# Patient Record
Sex: Female | Born: 1940 | Race: Black or African American | Hispanic: No | Smoking: Never smoker
Health system: Southern US, Community
[De-identification: ages and names within clinical notes are randomized; demographics above are authoritative.]

## PROBLEM LIST (undated history)

## (undated) DIAGNOSIS — F32A Depression, unspecified: Secondary | ICD-10-CM

## (undated) DIAGNOSIS — D649 Anemia, unspecified: Secondary | ICD-10-CM

## (undated) DIAGNOSIS — Z9289 Personal history of other medical treatment: Secondary | ICD-10-CM

## (undated) DIAGNOSIS — K219 Gastro-esophageal reflux disease without esophagitis: Secondary | ICD-10-CM

## (undated) DIAGNOSIS — R2689 Other abnormalities of gait and mobility: Secondary | ICD-10-CM

## (undated) DIAGNOSIS — C801 Malignant (primary) neoplasm, unspecified: Secondary | ICD-10-CM

## (undated) DIAGNOSIS — E538 Deficiency of other specified B group vitamins: Secondary | ICD-10-CM

## (undated) DIAGNOSIS — I10 Essential (primary) hypertension: Secondary | ICD-10-CM

## (undated) DIAGNOSIS — K635 Polyp of colon: Secondary | ICD-10-CM

## (undated) DIAGNOSIS — R140 Abdominal distension (gaseous): Secondary | ICD-10-CM

## (undated) DIAGNOSIS — F329 Major depressive disorder, single episode, unspecified: Secondary | ICD-10-CM

## (undated) DIAGNOSIS — F0390 Unspecified dementia without behavioral disturbance: Secondary | ICD-10-CM

## (undated) DIAGNOSIS — F419 Anxiety disorder, unspecified: Secondary | ICD-10-CM

## (undated) DIAGNOSIS — K921 Melena: Secondary | ICD-10-CM

## (undated) DIAGNOSIS — R32 Unspecified urinary incontinence: Secondary | ICD-10-CM

## (undated) HISTORY — DX: Essential (primary) hypertension: I10

## (undated) HISTORY — DX: Polyp of colon: K63.5

## (undated) HISTORY — PX: OTHER SURGICAL HISTORY: SHX169

## (undated) HISTORY — PX: ABDOMINAL HYSTERECTOMY: SHX81

## (undated) HISTORY — DX: Melena: K92.1

## (undated) HISTORY — DX: Personal history of other medical treatment: Z92.89

## (undated) HISTORY — DX: Unspecified urinary incontinence: R32

---

## 1997-11-01 ENCOUNTER — Other Ambulatory Visit: Admission: RE | Admit: 1997-11-01 | Discharge: 1997-11-01 | Payer: Self-pay | Admitting: *Deleted

## 1998-04-04 ENCOUNTER — Emergency Department (HOSPITAL_COMMUNITY): Admission: EM | Admit: 1998-04-04 | Discharge: 1998-04-04 | Payer: Self-pay | Admitting: Emergency Medicine

## 1999-07-17 ENCOUNTER — Emergency Department (HOSPITAL_COMMUNITY): Admission: EM | Admit: 1999-07-17 | Discharge: 1999-07-17 | Payer: Self-pay | Admitting: Emergency Medicine

## 2004-09-03 ENCOUNTER — Emergency Department (HOSPITAL_COMMUNITY): Admission: EM | Admit: 2004-09-03 | Discharge: 2004-09-03 | Payer: Self-pay | Admitting: Emergency Medicine

## 2004-09-18 ENCOUNTER — Emergency Department (HOSPITAL_COMMUNITY): Admission: EM | Admit: 2004-09-18 | Discharge: 2004-09-18 | Payer: Self-pay | Admitting: Emergency Medicine

## 2013-02-15 ENCOUNTER — Encounter (HOSPITAL_COMMUNITY): Payer: Self-pay | Admitting: Emergency Medicine

## 2013-02-15 ENCOUNTER — Emergency Department (HOSPITAL_COMMUNITY)
Admission: EM | Admit: 2013-02-15 | Discharge: 2013-02-15 | Disposition: A | Discharge status: Home / Self Care | Payer: Medicare Other | Attending: Emergency Medicine | Admitting: Emergency Medicine

## 2013-02-15 DIAGNOSIS — H5789 Other specified disorders of eye and adnexa: Secondary | ICD-10-CM

## 2013-02-15 DIAGNOSIS — M79604 Pain in right leg: Secondary | ICD-10-CM

## 2013-02-15 DIAGNOSIS — M79609 Pain in unspecified limb: Secondary | ICD-10-CM | POA: Insufficient documentation

## 2013-02-15 DIAGNOSIS — H579 Unspecified disorder of eye and adnexa: Secondary | ICD-10-CM | POA: Insufficient documentation

## 2013-02-15 MED ORDER — DIPHENHYDRAMINE HCL 25 MG PO TABS: 25.0000 mg | ORAL_TABLET | Freq: Four times a day (QID) | ORAL | Status: DC

## 2013-02-15 MED ORDER — DIPHENHYDRAMINE HCL 25 MG PO CAPS
25.0000 mg | ORAL_CAPSULE | Freq: Once | ORAL | Status: AC
  Administered 2013-02-15: 25 mg via ORAL
  Filled 2013-02-15: qty 1

## 2013-02-15 MED ORDER — IBUPROFEN 400 MG PO TABS
400.0000 mg | ORAL_TABLET | Freq: Four times a day (QID) | ORAL | Status: DC | PRN
Start: 2013-02-15 — End: 2013-02-15

## 2013-02-15 MED ORDER — ACETAMINOPHEN 325 MG PO TABS
650.0000 mg | ORAL_TABLET | Freq: Once | ORAL | Status: AC
  Administered 2013-02-15: 650 mg via ORAL
  Filled 2013-02-15: qty 2

## 2013-02-15 MED ORDER — IBUPROFEN 400 MG PO TABS: 400.0000 mg | ORAL_TABLET | Freq: Four times a day (QID) | ORAL | Status: DC | PRN

## 2013-02-15 NOTE — ED Provider Notes (Signed)
CSN: [id number]     Arrival date & time 02/15/13  1452 History  This chart was scribed for non-physician practitioner, Noland Fordyce, PA-C working with Alfonzo Feller, DO by Frederich Balding, ED scribe. This patient was seen in room TR06C/TR06C and the patient's care was started at 3:44 PM.   Chief Complaint  Patient presents with  . Rash  . Leg Pain   The history is provided by the patient. No language interpreter was used.   HPI Comments: [name] is a 73 y.o. female who presents to the Emergency Department complaining of gradual onset, constant, bilateral eye itching that started about two weeks ago. She states she used tea tree under eyes earlier to relieve some itching but states it made it worse and she developed a rash. Denies visual disturbance. Pt is also complaining of intermittent, sharp right shin pain that started one week ago. Denies recent injury or new activities. Rates the pain 8/10. She has taken Tylenol with no relief of pain. Elevating the leg relieves some pain. Denies history of blood clots. Denies calf pain, redness or bruising.   History reviewed. No pertinent past medical history. History reviewed. No pertinent past surgical history. No family history on file. History  Substance Use Topics  . Smoking status: Never Smoker   . Smokeless tobacco: Not on file  . Alcohol Use: No   OB History   Grav Para Term Preterm Abortions TAB SAB Ect Mult Living                 Review of Systems  Eyes: Positive for itching. Negative for visual disturbance.  Musculoskeletal: Positive for myalgias.  Skin: Positive for rash. Negative for color change.  All other systems reviewed and are negative.   Allergies  Review of patient's allergies indicates no known allergies.  Home Medications   Current Outpatient Rx  Name  Route  Sig  Dispense  Refill  . acetaminophen (TYLENOL) 325 MG tablet   Oral   Take 650 mg by mouth every 6 (six) hours as needed for moderate  pain.         . diphenhydrAMINE (BENADRYL) 25 MG tablet   Oral   Take 1 tablet (25 mg total) by mouth every 6 (six) hours.   20 tablet   0   . ibuprofen (ADVIL,MOTRIN) 400 MG tablet   Oral   Take 1 tablet (400 mg total) by mouth every 6 (six) hours as needed.   15 tablet   0     BP 169/71  Pulse 96  Temp(Src) 97.2 F (36.2 C) (Oral)  Resp 18  Ht 5\' 4"  (1.626 m)  Wt 187 lb (84.823 kg)  BMI 32.08 kg/m2  SpO2 100%  Physical Exam  Nursing note and vitals reviewed. Constitutional: She appears well-developed and well-nourished. No distress.  HENT:  Head: Normocephalic and atraumatic.  Eyes: Conjunctivae are normal. No scleral icterus.  Clear discharge in right eye. Mild erythema under both eyes. No edema. No evidence of periorbital cellulitis.   Neck: Normal range of motion.  Cardiovascular: Normal rate, regular rhythm and normal heart sounds.   Pulmonary/Chest: Effort normal and breath sounds normal. No respiratory distress. She has no wheezes. She has no rales. She exhibits no tenderness.  Musculoskeletal: Normal range of motion.  Right anterior leg tenderness over muscle. No bony tenderness. No ecchymosis, erythema or lesions. Muscle compartment is soft. Not concerned for compartments syndrome.   Neurological: She is alert.  Skin: Skin is  warm and dry. She is not diaphoretic.    ED Course  Procedures (including critical care time)  DIAGNOSTIC STUDIES: Oxygen Saturation is 100% on RA, normal by my interpretation.    COORDINATION OF CARE: 3:51 PM-Discussed treatment plan which includes warm and cool compresses and Benadryl for her eyes and ice and an anti-inflammatory for her leg with pt at bedside and pt agreed to plan. Advised pt to follow up with an ophthalmologist if eye symptoms do not resolve.   Labs Review Labs Reviewed - No data to display Imaging Review No results found.  EKG Interpretation   None       MDM   1. Irritation of both eyes   2. Right  leg pain    Not concerned for DVT in right leg, pain is anterior. Tender along muscle but soft compartment. No hx of trauma. No hx of blood clots.    Eyes: mild erythema under both eyes. No edema or circular erythema or tenderness. Clear discharge right eye. Not concerned for periorbital cellulitis. Advised to discontinue tea tree oil.  F/u with PCP. Return precautions provided. Pt verbalized understanding and agreement with tx plan.  Discussed pt with Dr. Thurnell Garbe who agreed with assessment and plan.  I personally performed the services described in this documentation, which was scribed in my presence. The recorded information has been reviewed and is accurate.   Noland Fordyce, PA-C 02/16/13 0001

## 2013-02-15 NOTE — ED Notes (Signed)
Pt presents to department for evaluation of rash around both eyes and R leg pain. States she recently started using tea tree oil on face and now rash around both eyes. Also states R leg pain x1 week. Denies recent injury. Pt is alert and oriented x4.

## 2013-02-16 NOTE — ED Provider Notes (Signed)
Medical screening examination/treatment/procedure(s) were performed by non-physician practitioner and as supervising physician I was immediately available for consultation/collaboration.  EKG Interpretation   None         Alfonzo Feller, DO 02/16/13 1137

## 2013-07-26 ENCOUNTER — Observation Stay: Payer: Self-pay | Admitting: Internal Medicine

## 2013-07-26 LAB — HEPATIC FUNCTION PANEL A (ARMC)
ALT: 16 U/L (ref 12–78)
Albumin: 3.7 g/dL (ref 3.4–5.0)
Alkaline Phosphatase: 77 U/L
BILIRUBIN TOTAL: 0.3 mg/dL (ref 0.2–1.0)
Bilirubin, Direct: <0.1 mg/dL (ref 0.00–0.20)
SGOT(AST): 16 U/L (ref 15–37)
Total Protein: 8.1 g/dL (ref 6.4–8.2)

## 2013-07-26 LAB — URINALYSIS, COMPLETE
Bacteria: NONE SEEN
Bilirubin,UR: NEGATIVE
Glucose,UR: NEGATIVE mg/dL (ref 0–75)
Ketone: NEGATIVE
Nitrite: NEGATIVE
Ph: 5 (ref 4.5–8.0)
Protein: NEGATIVE
SPECIFIC GRAVITY: 1.014 (ref 1.003–1.030)
Squamous Epithelial: 5

## 2013-07-26 LAB — CBC WITH DIFFERENTIAL/PLATELET
BASOS ABS: 1 %
Eosinophil: 3 %
HCT: 28.5 % — ABNORMAL LOW (ref 35.0–47.0)
HGB: 6.9 g/dL — AB (ref 12.0–16.0)
Lymphocytes: 33 %
MCHC: 26.4 g/dL — ABNORMAL LOW (ref 32.0–36.0)
MCV: 50 fL — AB (ref 80–100)
Monocytes: 6 %
Platelet: 588 10*3/uL — ABNORMAL HIGH (ref 150–440)
RBC: 5.25 10*6/uL — ABNORMAL HIGH (ref 3.80–5.20)
RDW: 23.6 % — ABNORMAL HIGH (ref 11.5–14.5)
Segmented Neutrophils: 56 %
Variant Lymphocyte - H1-Rlymph: 1 %
WBC: 7.2 10*3/uL (ref 3.6–11.0)

## 2013-07-26 LAB — BASIC METABOLIC PANEL
Anion Gap: 8 (ref 7–16)
BUN: 10 mg/dL (ref 7–18)
Calcium, Total: 9.2 mg/dL (ref 8.5–10.1)
Chloride: 102 mmol/L (ref 98–107)
Co2: 25 mmol/L (ref 21–32)
Creatinine: 0.7 mg/dL (ref 0.60–1.30)
EGFR (African American): >60
EGFR (Non-African Amer.): >60
Glucose: 146 mg/dL — ABNORMAL HIGH (ref 65–99)
Osmolality: 272 (ref 275–301)
Potassium: 3.9 mmol/L (ref 3.5–5.1)
SODIUM: 135 mmol/L — AB (ref 136–145)

## 2013-07-26 LAB — RETICULOCYTES
Absolute Retic Count: 0.0832 10*6/uL (ref 0.019–0.186)
RETICULOCYTE: 1.61 % (ref 0.4–3.1)

## 2013-07-26 LAB — IRON AND TIBC
IRON BIND. CAP.(TOTAL): 463 ug/dL — AB (ref 250–450)
Iron Saturation: 3 %
Iron: 15 ug/dL — ABNORMAL LOW (ref 50–170)
UNBOUND IRON-BIND. CAP.: 448 ug/dL

## 2013-07-26 LAB — PROTIME-INR
INR: 1.1
PROTHROMBIN TIME: 13.6 s (ref 11.5–14.7)

## 2013-07-27 LAB — IRON AND TIBC
Iron Bind.Cap.(Total): 459 ug/dL — ABNORMAL HIGH (ref 250–450)
Iron Saturation: 3 %
Iron: 12 ug/dL — ABNORMAL LOW (ref 50–170)
UNBOUND IRON-BIND. CAP.: 447 ug/dL

## 2013-07-27 LAB — CBC WITH DIFFERENTIAL/PLATELET
Eosinophil: 4 %
HCT: 26.7 % — AB (ref 35.0–47.0)
HGB: 6.6 g/dL — AB (ref 12.0–16.0)
Lymphocytes: 33 %
MCHC: 27.1 g/dL — ABNORMAL LOW (ref 32.0–36.0)
MCV: 50 fL — ABNORMAL LOW (ref 80–100)
Monocytes: 8 %
PLATELETS: 541 10*3/uL — AB (ref 150–440)
RBC: 4.89 10*6/uL (ref 3.80–5.20)
RDW: 22.8 % — AB (ref 11.5–14.5)
SEGMENTED NEUTROPHILS: 55 %
WBC: 6.8 10*3/uL (ref 3.6–11.0)

## 2013-07-27 LAB — BASIC METABOLIC PANEL
ANION GAP: 6 — AB (ref 7–16)
BUN: 8 mg/dL (ref 7–18)
CALCIUM: 9 mg/dL (ref 8.5–10.1)
CHLORIDE: 103 mmol/L (ref 98–107)
Co2: 25 mmol/L (ref 21–32)
Creatinine: 0.85 mg/dL (ref 0.60–1.30)
EGFR (Non-African Amer.): >60
Glucose: 117 mg/dL — ABNORMAL HIGH (ref 65–99)
OSMOLALITY: 268 (ref 275–301)
POTASSIUM: 4 mmol/L (ref 3.5–5.1)
Sodium: 134 mmol/L — ABNORMAL LOW (ref 136–145)

## 2013-07-27 LAB — FOLATE: Folic Acid: 19.4 ng/mL (ref 3.1–100.0)

## 2013-07-28 LAB — CBC WITH DIFFERENTIAL/PLATELET
Eosinophil: 1 %
HCT: 24.4 % — AB (ref 35.0–47.0)
HGB: 6.6 g/dL — ABNORMAL LOW (ref 12.0–16.0)
LYMPHS PCT: 33 %
MCH: 13.4 pg — ABNORMAL LOW (ref 26.0–34.0)
MCHC: 27 g/dL — ABNORMAL LOW (ref 32.0–36.0)
MCV: 50 fL — ABNORMAL LOW (ref 80–100)
Monocytes: 8 %
PLATELETS: 494 10*3/uL — AB (ref 150–440)
RBC: 4.91 10*6/uL (ref 3.80–5.20)
RDW: 23 % — ABNORMAL HIGH (ref 11.5–14.5)
Segmented Neutrophils: 58 %
WBC: 6 10*3/uL (ref 3.6–11.0)

## 2013-07-28 LAB — URINE CULTURE

## 2013-08-04 ENCOUNTER — Ambulatory Visit: Payer: Self-pay | Admitting: Oncology

## 2013-08-04 LAB — CANCER CENTER HEMOGLOBIN: HGB: 8.1 g/dL — ABNORMAL LOW (ref 12.0–16.0)

## 2013-08-05 LAB — CEA: CEA: 1.6 ng/mL (ref 0.0–4.7)

## 2013-08-05 LAB — CA 125: CA 125: 10 U/mL (ref 0.0–34.0)

## 2013-08-29 ENCOUNTER — Ambulatory Visit: Payer: Self-pay | Admitting: Oncology

## 2014-05-22 NOTE — Consult Note (Signed)
PATIENT NAME:  [name], [name] MR#:  [id number] DATE OF BIRTH:  [birth year]  DATE OF CONSULTATION:  07/27/2013  REQUESTING PHYSICIAN:  Dr. Benjie Karvonen.  CONSULTING PHYSICIAN:  Otelia Limes. Yves Dill, MD.  REASON FOR CONSULTATION: Microscopic hematuria.   HISTORY OF PRESENT ILLNESS: [name] is a 74 year old African American female admitted to the hospital with vertigo and dizziness. She was found to have a hemoglobin of 6.8 in the Emergency Room and was admitted. She was evaluated with urinalysis, which indicated 426 red cells per high-power field. She also had a CT scan which indicated the presence of a 3-mm right lower pole renal calculus. She specifically denied gross hematuria, flank pain, dysuria, history of renal colic, or history of urologic instrumentation or surgery.   ALLERGIES:  No drug allergies.   PAST MEDICAL HISTORY: No medications, no previous surgery.   SOCIAL HISTORY:  She denied tobacco or alcohol use.   FAMILY HISTORY: Negative for urologic and renal disease.   REVIEW OF SYSTEMS: The patient denied heart disease, lung disease, stroke, diabetes, or hypertension.   PHYSICAL EXAMINATION:  GENERAL: Well-nourished, African American female in distress.  HEENT: Sclerae are clear. Pupils are equally round, reactive to light. Extraocular movements are intact.  NECK: Supple. No palpable cervical adenopathy.  LUNGS: Clear to auscultation.  CARDIOVASCULAR: Regular rhythm and rate without audible murmurs.  ABDOMEN: Soft, nontender abdomen. No CVA tenderness.  GU/RECTAL: Deferred.   LABORATORY DATA:  BUN was 8, creatinine 0.85 on June 29.  White cell count was 6800, hematocrit 26.7% on June 29.   CT scan report was reviewed.   IMPRESSION:  1.  Microscopic hematuria.  2.  Right nephrolithiasis.   SUGGESTIONS:  1.  I explained hematuria evaluation and etiologies with the patient.  2.  Suggest she follow up in the office for cystoscopy and to repeat urinalysis.  3.  There are no  emergent indications for cystoscopy or lithotripsy at this time.    ____________________________ Otelia Limes. Yves Dill, MD mrw:lt D: 07/27/2013 12:58:03 ET T: 07/27/2013 13:48:46 ET JOB#: [id number]  cc: Otelia Limes. Yves Dill, MD, <Dictator> Royston Cowper MD ELECTRONICALLY SIGNED 07/28/2013 8:35

## 2014-05-22 NOTE — Discharge Summary (Signed)
PATIENT NAME:  [name], [name] MR#:  [id number] DATE OF BIRTH:  [birth year]  DATE OF ADMISSION:  07/26/2013 DATE OF DISCHARGE:  07/28/2013  ADMISSION DIAGNOSIS: Anemia.   DISCHARGE DIAGNOSES: 1.  Anemia, iron deficiency.  2.  Calf pain.  3.  Hematuria.  4.  Pelvic mass.   CONSULTATIONS: Urology, Dr. Yves Dill.   DIAGNOSTIC DATA: Laboratories at discharge: White blood cells 6, hemoglobin 6.6, hematocrit 25, platelets 494,000.   Lower extremity Doppler negative for DVT.   Carotid Dopplers were negative for hemodynamically significant stenosis.   Iron is 12. Iron saturation is 3.  Vitamin B12 was normal.   Folic acid was 29.5.   CT of the abdomen and pelvis with contrast showed no acute intra-abdominal findings. There is an 8 cm left pelvic mass which could be adnexal or uterine. Also, abnormal endometrial thickening around the endometrial implant. Recommend nonemergent GYN consult.   HOSPITAL COURSE: A 74 year old female who presented with feeling wobbly and found to have significant anemia.  1.  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 The patient has a low MCV. 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 I discussed in great length with the patient and the patient's family members the risks including MI, CVA and end organ damage of continuing to have a very low hemoglobin. The patient did not want a blood transfusion. She wanted blood transfusion from her family members so I called the blood bank and they obviously cannot get this at any time soon so they recommended for the patient's family to call the blood bank to make an appointment for direct donor. I told the patient this and her family member and provided the phone number on her discharge paperwork. The patient will be discharged with iron supplements. Her vitals were stable at discharge. Again, the patient was given side effects, alternatives, benefits and risks and she  chose not to get any blood transfusions. The patient will also have outpatient followup with hematology for further evaluation of this chronic blood loss anemia and will likely need a GI consultation as well. 2.  Hematuria. The patient was seen by urology here and she will outpatient follow-up for possible cystoscopy.  3.  Pelvic mass seen on CT scan. The patient will have outpatient follow-up with GYN. 4.  Dizziness. Probably secondary to problem #1. 5.  Left calf pain. Negative DVT,  which improved.   DISCHARGE MEDICATIONS: Ferrous sulfate 325 p.o. b.i.d.   DISCHARGE DIET: Regular diet.   DISCHARGE ACTIVITY: As tolerated.   DISCHARGE FOLLOWUP: The patient will follow up with oncology, urology and GYN within 1 week.   The patient is medically stable for discharge. Plan of care was discussed with the patient and family members.   TIME SPENT: 40 minutes.   ____________________________ Donell Beers. Benjie Karvonen, MD spm:sb D: 07/28/2013 11:58:26 ET T: 07/28/2013 12:16:26 ET JOB#: [id number]  cc: Sital P. Benjie Karvonen, MD, <Dictator> Donell Beers MODY MD ELECTRONICALLY SIGNED 07/29/2013 13:37

## 2014-05-22 NOTE — Consult Note (Signed)
Brief Consult Note: Diagnosis: Microscopic hematuria. Right nephrolithiasis.   Patient was seen by consultant.   Consult note dictated.   Recommend further assessment or treatment.   Orders entered.   Discussed with Attending MD.   Comments: Follow-up in the office. No urgent indication for cystoscopy or ESWL at this time. Discussed hematuria causes and evaluation with patient.  Electronic Signatures: Royston Cowper (MD)  (Signed 29-Jun-15 12:53)  Authored: Brief Consult Note   Last Updated: 29-Jun-15 12:53 by Royston Cowper (MD)

## 2014-05-22 NOTE — H&P (Signed)
PATIENT NAME:  [name], [name] MR#:  [id number] DATE OF BIRTH:  [birth year]  DATE OF ADMISSION:  07/26/2013  PRIMARY CARE PHYSICIAN: None.   REFERRING PHYSICIAN: Dr. Graciella Freer.   CHIEF COMPLAINT: Feeling wobbly.   HISTORY OF PRESENT ILLNESS: [name] is a 74 year old female with no past medical history. Comes to the Emergency Department feeling dizzy since yesterday. The symptoms were worse today and fell down into the couch. Concerning this, the patient is brought to the Emergency Department. Workup in the Emergency Department: The patient is found to have hemoglobin of 6.8 with an MCV of 50. The patient denies having any bloody stools. The patient never had any colonoscopy. The patient states sees some blood after she wipes after urination, most likely from the vagina. Denies having any blood in the urine. A urinalysis shows RBCs of 426. The patient declines any transfusion. The patient states had Pap smear many years ago.   PAST MEDICAL HISTORY: None.   PAST SURGICAL HISTORY: None.   ALLERGIES: No known drug allergies.   HOME MEDICATIONS: None.   SOCIAL HISTORY: No history of smoking, drinking alcohol or using illicit drugs. Lives by herself.   FAMILY HISTORY: Both parents dies of old age.    REVIEW OF SYSTEMS:  CONSTITUTIONAL: Denies any generalized weakness.  EYES: No change in vision.  EARS, NOSE, THROAT: No change in hearing.  RESPIRATORY: No cough, shortness of breath.  CARDIOVASCULAR: No chest pain, palpations.  GASTROINTESTINAL: No nausea, vomiting, abdominal pain.  GENITOURINARY: No dysuria or hematuria; however, has some vaginal bleed.  ENDOCRINE: No polyuria or polydipsia.  SKIN: No rash or lesions.   MUSCULOSKELETAL: No joint pains and aches.  NEUROLOGIC: No weakness or numbness in any part of the body.   PHYSICAL EXAMINATION:  GENERAL: This is a well-built, well-nourished, age-appropriate female lying down in the bed, not in distress.  VITAL SIGNS:  Temperature 98.5, pulse 92, blood pressure 155/81, respiratory rate of 16, oxygen saturation 100% on room air.  HEENT: Head normocephalic, atraumatic. There is no scleral icterus. Conjunctivae normal. Pupils equal and react to light. Extraocular movements are intact. Mucous membranes moist. No pharyngeal erythema.  NECK: Supple. No lymphadenopathy. No JVD. No carotid bruit. No thyromegaly.  CHEST: Has no focal tenderness.  LUNGS: Bilaterally clear to auscultation.  HEART: S1, S2 regular. No murmurs are heard.  ABDOMEN: Obese. Bowel sounds present. Soft, nontender, nondistended. No hepatosplenomegaly.  EXTREMITIES: No pedal edema. Pulses 2+.  SKIN: No rash or lesions.  MUSCULOSKELETAL: No joint pains and aches.  NEUROLOGIC: The patient is alert, oriented to place, person, and time. Cranial nerves II through XII intact. Motor 5/5 in upper and lower extremities.   LABORATORIES: BMP is completely within normal limits. CBC: WBC of 7.2, hemoglobin 6.9, platelet count of 588, MCV of 50.   ASSESSMENT AND PLAN: [name] a 74 year old female who comes to the Emergency Department with wobbliness, dizziness.  1. Anemia: The patient has a low MCV. Concerned about secondary to blood loss of a chronic nature. The source of the bleeding is uncertain. Stool occult is done, weakly positive. Urine shows RBCs . The patient also states has some vaginal bleed. Concerning this, possible multiple sources. Will obtain CT abdomen and pelvis with contrast. The patient has not seen any physician for many years. Will obtain workup with iron profile, B12, folate, RBC and retic count. The patient declines a blood transfusion.  2. Wobbliness: Most likely secondary to low hemoglobin; however, does not show any neurologic  deficits. The patient currently denies having any symptoms. Concerning the patient's age, will obtain MRI of the brain, carotid Dopplers and echocardiogram, doing possible workup for transient ischemic attack.   3. Hematuria: As mentioned above. Will obtain CT abdomen and pelvis. Consider consulting urology.  4. Keep the patient on deep vein thrombosis prophylaxis with sequential compression devices.   TIME SPENT: 50 minutes.    ____________________________ Monica Becton, MD pv:gb D: 07/26/2013 22:36:20 ET T: 07/27/2013 01:18:06 ET JOB#: [id number]  cc: Monica Becton, MD, <Dictator> Monica Becton MD ELECTRONICALLY SIGNED 08/05/2013 21:53

## 2015-01-30 ENCOUNTER — Inpatient Hospital Stay
Admission: EM | Admit: 2015-01-30 | Discharge: 2015-02-04 | DRG: 755 | Disposition: A | Discharge status: Skilled Nursing Facility | Payer: Medicare Other | Attending: Internal Medicine | Admitting: Internal Medicine

## 2015-01-30 ENCOUNTER — Inpatient Hospital Stay: Payer: Medicare Other

## 2015-01-30 ENCOUNTER — Encounter: Payer: Self-pay | Admitting: Emergency Medicine

## 2015-01-30 DIAGNOSIS — R1909 Other intra-abdominal and pelvic swelling, mass and lump: Secondary | ICD-10-CM | POA: Diagnosis not present

## 2015-01-30 DIAGNOSIS — C569 Malignant neoplasm of unspecified ovary: Secondary | ICD-10-CM | POA: Diagnosis not present

## 2015-01-30 DIAGNOSIS — R2681 Unsteadiness on feet: Secondary | ICD-10-CM | POA: Diagnosis not present

## 2015-01-30 DIAGNOSIS — D473 Essential (hemorrhagic) thrombocythemia: Secondary | ICD-10-CM | POA: Diagnosis present

## 2015-01-30 DIAGNOSIS — K219 Gastro-esophageal reflux disease without esophagitis: Secondary | ICD-10-CM | POA: Diagnosis present

## 2015-01-30 DIAGNOSIS — N950 Postmenopausal bleeding: Secondary | ICD-10-CM | POA: Diagnosis present

## 2015-01-30 DIAGNOSIS — R188 Other ascites: Secondary | ICD-10-CM | POA: Diagnosis present

## 2015-01-30 DIAGNOSIS — R140 Abdominal distension (gaseous): Secondary | ICD-10-CM | POA: Diagnosis not present

## 2015-01-30 DIAGNOSIS — R609 Edema, unspecified: Secondary | ICD-10-CM

## 2015-01-30 DIAGNOSIS — E860 Dehydration: Secondary | ICD-10-CM | POA: Diagnosis present

## 2015-01-30 DIAGNOSIS — Z79899 Other long term (current) drug therapy: Secondary | ICD-10-CM | POA: Diagnosis not present

## 2015-01-30 DIAGNOSIS — D649 Anemia, unspecified: Secondary | ICD-10-CM | POA: Diagnosis present

## 2015-01-30 DIAGNOSIS — R1111 Vomiting without nausea: Secondary | ICD-10-CM | POA: Diagnosis not present

## 2015-01-30 DIAGNOSIS — M7989 Other specified soft tissue disorders: Secondary | ICD-10-CM | POA: Diagnosis not present

## 2015-01-30 DIAGNOSIS — F5089 Other specified eating disorder: Secondary | ICD-10-CM | POA: Diagnosis present

## 2015-01-30 DIAGNOSIS — D500 Iron deficiency anemia secondary to blood loss (chronic): Secondary | ICD-10-CM | POA: Diagnosis present

## 2015-01-30 DIAGNOSIS — Z124 Encounter for screening for malignant neoplasm of cervix: Secondary | ICD-10-CM | POA: Diagnosis not present

## 2015-01-30 DIAGNOSIS — E871 Hypo-osmolality and hyponatremia: Secondary | ICD-10-CM | POA: Diagnosis present

## 2015-01-30 DIAGNOSIS — R112 Nausea with vomiting, unspecified: Secondary | ICD-10-CM | POA: Diagnosis not present

## 2015-01-30 DIAGNOSIS — R7301 Impaired fasting glucose: Secondary | ICD-10-CM | POA: Diagnosis present

## 2015-01-30 DIAGNOSIS — N939 Abnormal uterine and vaginal bleeding, unspecified: Secondary | ICD-10-CM | POA: Diagnosis not present

## 2015-01-30 DIAGNOSIS — Z6841 Body Mass Index (BMI) 40.0 and over, adult: Secondary | ICD-10-CM

## 2015-01-30 DIAGNOSIS — R1110 Vomiting, unspecified: Secondary | ICD-10-CM

## 2015-01-30 DIAGNOSIS — Z9119 Patient's noncompliance with other medical treatment and regimen: Secondary | ICD-10-CM | POA: Diagnosis not present

## 2015-01-30 DIAGNOSIS — R6889 Other general symptoms and signs: Secondary | ICD-10-CM | POA: Diagnosis not present

## 2015-01-30 DIAGNOSIS — Z5189 Encounter for other specified aftercare: Secondary | ICD-10-CM | POA: Diagnosis not present

## 2015-01-30 DIAGNOSIS — R9431 Abnormal electrocardiogram [ECG] [EKG]: Secondary | ICD-10-CM | POA: Diagnosis not present

## 2015-01-30 DIAGNOSIS — M6281 Muscle weakness (generalized): Secondary | ICD-10-CM | POA: Diagnosis not present

## 2015-01-30 DIAGNOSIS — R1900 Intra-abdominal and pelvic swelling, mass and lump, unspecified site: Secondary | ICD-10-CM | POA: Diagnosis not present

## 2015-01-30 DIAGNOSIS — E876 Hypokalemia: Secondary | ICD-10-CM | POA: Diagnosis present

## 2015-01-30 DIAGNOSIS — N949 Unspecified condition associated with female genital organs and menstrual cycle: Secondary | ICD-10-CM | POA: Diagnosis not present

## 2015-01-30 DIAGNOSIS — R319 Hematuria, unspecified: Secondary | ICD-10-CM | POA: Diagnosis present

## 2015-01-30 DIAGNOSIS — R41841 Cognitive communication deficit: Secondary | ICD-10-CM | POA: Diagnosis not present

## 2015-01-30 HISTORY — DX: Anemia, unspecified: D64.9

## 2015-01-30 LAB — CBC WITH DIFFERENTIAL/PLATELET
Basophils Absolute: 0.1 10*3/uL (ref 0–0.1)
EOS ABS: 0.1 10*3/uL (ref 0–0.7)
Eosinophils Relative: 1 %
HCT: 20.7 % — ABNORMAL LOW (ref 35.0–47.0)
HEMOGLOBIN: 5.3 g/dL — AB (ref 12.0–16.0)
LYMPHS ABS: 1.3 10*3/uL (ref 1.0–3.6)
Lymphocytes Relative: 13 %
MCH: 12.1 pg — ABNORMAL LOW (ref 26.0–34.0)
MCHC: 25.8 g/dL — ABNORMAL LOW (ref 32.0–36.0)
MCV: 46.9 fL — ABNORMAL LOW (ref 80.0–100.0)
Monocytes Absolute: 0.7 10*3/uL (ref 0.2–0.9)
Monocytes Relative: 8 %
Neutro Abs: 7.4 10*3/uL — ABNORMAL HIGH (ref 1.4–6.5)
Platelets: 454 10*3/uL — ABNORMAL HIGH (ref 150–440)
RBC: 4.41 MIL/uL (ref 3.80–5.20)
RDW: 22.3 % — ABNORMAL HIGH (ref 11.5–14.5)
WBC: 9.6 10*3/uL (ref 3.6–11.0)

## 2015-01-30 LAB — URINALYSIS COMPLETE WITH MICROSCOPIC (ARMC ONLY)
Bacteria, UA: NONE SEEN
Bilirubin Urine: NEGATIVE
Glucose, UA: NEGATIVE mg/dL
Nitrite: NEGATIVE
PROTEIN: 30 mg/dL — AB
SPECIFIC GRAVITY, URINE: 1.014 (ref 1.005–1.030)
pH: 6 (ref 5.0–8.0)

## 2015-01-30 LAB — COMPREHENSIVE METABOLIC PANEL
ALBUMIN: 3 g/dL — AB (ref 3.5–5.0)
ALK PHOS: 44 U/L (ref 38–126)
ALT: 13 U/L — AB (ref 14–54)
AST: 16 U/L (ref 15–41)
Anion gap: 6 (ref 5–15)
BUN: 9 mg/dL (ref 6–20)
CALCIUM: 7.9 mg/dL — AB (ref 8.9–10.3)
CO2: 21 mmol/L — AB (ref 22–32)
CREATININE: 0.61 mg/dL (ref 0.44–1.00)
Chloride: 100 mmol/L — ABNORMAL LOW (ref 101–111)
GFR calc Af Amer: >60 mL/min (ref >60)
GFR calc non Af Amer: >60 mL/min (ref >60)
GLUCOSE: 153 mg/dL — AB (ref 65–99)
Potassium: 3.2 mmol/L — ABNORMAL LOW (ref 3.5–5.1)
SODIUM: 127 mmol/L — AB (ref 135–145)
Total Bilirubin: 0.9 mg/dL (ref 0.3–1.2)
Total Protein: 6.7 g/dL (ref 6.5–8.1)

## 2015-01-30 LAB — FERRITIN: Ferritin: 5 ng/mL — ABNORMAL LOW (ref 11–307)

## 2015-01-30 LAB — ABO/RH: ABO/RH(D): O POS

## 2015-01-30 LAB — PREPARE RBC (CROSSMATCH)

## 2015-01-30 LAB — LIPASE, BLOOD: Lipase: 23 U/L (ref 11–51)

## 2015-01-30 MED ORDER — SODIUM CHLORIDE 0.9 % IV SOLN: 10.0000 mL/h | Freq: Once | INTRAVENOUS | Status: DC

## 2015-01-30 MED ORDER — SODIUM CHLORIDE 0.9 % IJ SOLN
3.0000 mL | Freq: Two times a day (BID) | INTRAMUSCULAR | Status: DC
  Administered 2015-01-30 – 2015-02-04 (×8): 3 mL via INTRAVENOUS

## 2015-01-30 MED ORDER — MORPHINE SULFATE (PF) 2 MG/ML IV SOLN
2.0000 mg | INTRAVENOUS | Status: DC | PRN
  Administered 2015-02-02 – 2015-02-04 (×4): 2 mg via INTRAVENOUS
  Filled 2015-01-30 (×4): qty 1

## 2015-01-30 MED ORDER — ONDANSETRON HCL 4 MG PO TABS: 4.0000 mg | ORAL_TABLET | Freq: Four times a day (QID) | ORAL | Status: DC | PRN

## 2015-01-30 MED ORDER — PROMETHAZINE HCL 25 MG/ML IJ SOLN
12.5000 mg | Freq: Once | INTRAMUSCULAR | Status: AC
  Administered 2015-01-30: 12.5 mg via INTRAVENOUS

## 2015-01-30 MED ORDER — HALOPERIDOL LACTATE 5 MG/ML IJ SOLN
5.0000 mg | Freq: Once | INTRAMUSCULAR | Status: AC
  Administered 2015-01-30: 22:00:00 5 mg via INTRAVENOUS
  Filled 2015-01-30: qty 1

## 2015-01-30 MED ORDER — ACETAMINOPHEN 325 MG PO TABS: 650.0000 mg | ORAL_TABLET | Freq: Four times a day (QID) | ORAL | Status: DC | PRN

## 2015-01-30 MED ORDER — ONDANSETRON HCL 4 MG/2ML IJ SOLN
4.0000 mg | Freq: Once | INTRAMUSCULAR | Status: AC
  Administered 2015-01-30: 4 mg via INTRAVENOUS
  Filled 2015-01-30: qty 2

## 2015-01-30 MED ORDER — POTASSIUM CHLORIDE IN NACL 20-0.9 MEQ/L-% IV SOLN
INTRAVENOUS | Status: DC
Start: 2015-01-30 — End: 2015-01-31
  Administered 2015-01-31: 02:00:00 via INTRAVENOUS
  Filled 2015-01-30 (×2): qty 1000

## 2015-01-30 MED ORDER — LORAZEPAM 2 MG/ML IJ SOLN
0.5000 mg | Freq: Once | INTRAMUSCULAR | Status: AC
  Administered 2015-01-30: 0.5 mg via INTRAVENOUS
  Filled 2015-01-30: qty 1

## 2015-01-30 MED ORDER — ONDANSETRON HCL 4 MG/2ML IJ SOLN
4.0000 mg | Freq: Four times a day (QID) | INTRAMUSCULAR | Status: DC | PRN
  Administered 2015-01-30: 4 mg via INTRAVENOUS
  Filled 2015-01-30: qty 2

## 2015-01-30 MED ORDER — SODIUM CHLORIDE 0.9 % IV SOLN
INTRAVENOUS | Status: DC
Start: 2015-01-30 — End: 2015-01-30

## 2015-01-30 MED ORDER — PROMETHAZINE HCL 25 MG/ML IJ SOLN: 12.5000 mg | Freq: Four times a day (QID) | INTRAMUSCULAR | Status: DC | PRN

## 2015-01-30 MED ORDER — PANTOPRAZOLE SODIUM 40 MG IV SOLR
40.0000 mg | Freq: Two times a day (BID) | INTRAVENOUS | Status: DC
  Administered 2015-01-30 – 2015-01-31 (×3): 40 mg via INTRAVENOUS
  Filled 2015-01-30 (×4): qty 40

## 2015-01-30 MED ORDER — ACETAMINOPHEN 650 MG RE SUPP: 650.0000 mg | Freq: Four times a day (QID) | RECTAL | Status: DC | PRN

## 2015-01-30 MED ORDER — PROMETHAZINE HCL 25 MG/ML IJ SOLN
INTRAMUSCULAR | Status: AC
  Administered 2015-01-30: 12.5 mg via INTRAVENOUS
  Filled 2015-01-30: qty 1

## 2015-01-30 NOTE — Progress Notes (Signed)
This nurse witness pt verbalize that she would like to go ahead with blood transfusion. Pt's son and daughter in law at bedside also witnessed.  Clarise Cruz, RN

## 2015-01-30 NOTE — ED Notes (Signed)
States was in house, felt nauseated, went out of the house to vomit. Arrives weak and nauseated. Abdominal and leg edema, patient states has had x 1 month.

## 2015-01-30 NOTE — H&P (Signed)
[facility] [place] NAME: [name]    MR#:  [id number]  DATE OF BIRTH:  Jun 06, 1940  DATE OF ADMISSION:  01/30/2015  PRIMARY CARE PHYSICIAN: No PCP Per Patient   REQUESTING/REFERRING PHYSICIAN: Dr. Archie Balboa  CHIEF COMPLAINT:   Chief Complaint  Patient presents with  . Emesis    HISTORY OF PRESENT ILLNESS:  [name]  is a 75 y.o. female with a known history of anemia. She presents to the ER with vomiting for 1 day. Increased abdominal distention. Not real abdominal pain. Of note, 6 months ago she was found to have an abdominal mass and was told to follow up. She followed up with Dr. Ouida Sills gynecology revised hysterectomy and she refused. She followed up with Dr. Eliberto Ivory the urology and she refused cystoscopy. Also refused colonoscopy at that time. In the ER, her hemoglobin is 5.3 and she is hesitant on transfusion. The ER physician ordered 2 units of blood. Patient eats ice at home.  PAST MEDICAL HISTORY:   Past Medical History  Diagnosis Date  . Anemia     PAST SURGICAL HISTORY:   Past Surgical History  Procedure Laterality Date  . None      SOCIAL HISTORY:   Social History  Substance Use Topics  . Smoking status: Never Smoker   . Smokeless tobacco: Not on file  . Alcohol Use: No    FAMILY HISTORY:  History reviewed. No pertinent family history.  DRUG ALLERGIES:  No Known Allergies  REVIEW OF SYSTEMS:  CONSTITUTIONAL: No fever, positive for weight gain. Positive for fatigue.  EYES: No blurred or double vision. Wears glasses EARS, NOSE, AND THROAT: No tinnitus or ear pain. No sore throat. Positive for runny nose RESPIRATORY: Positive for cough, positive for shortness of breath, no wheezing or hemoptysis.  CARDIOVASCULAR: No chest pain, orthopnea, edema.  GASTROINTESTINAL: Positive for nausea and vomiting, no diarrhea or abdominal pain. Occasional blood in bowel movements. Positive for abdominal  distention GENITOURINARY: No dysuria, positive for hematuria.  ENDOCRINE: No polyuria, nocturia,  HEMATOLOGY: History of anemia, no easy bruising or bleeding SKIN: No rash or lesion. MUSCULOSKELETAL: No joint pain or arthritis.   NEUROLOGIC: No tingling, numbness. Near syncope today PSYCHIATRY: No anxiety or depression.   MEDICATIONS AT HOME:   Prior to Admission medications   Medication Sig Start Date End Date Taking? Authorizing Provider  acetaminophen (TYLENOL) 325 MG tablet Take 650 mg by mouth every 6 (six) hours as needed for moderate pain.    Historical Provider, MD  diphenhydrAMINE (BENADRYL) 25 MG tablet Take 1 tablet (25 mg total) by mouth every 6 (six) hours. 02/15/13   Noland Fordyce, PA-C  ibuprofen (ADVIL,MOTRIN) 400 MG tablet Take 1 tablet (400 mg total) by mouth every 6 (six) hours as needed. 02/15/13   Noland Fordyce, PA-C      VITAL SIGNS:  Blood pressure 169/94, pulse 94, temperature 97.2 F (36.2 C), temperature source Oral, resp. rate 20, height 5\' 4"  (1.626 m), weight 122.471 kg (270 lb), SpO2 100 %.  PHYSICAL EXAMINATION:  GENERAL:  75 y.o.-year-old patient lying in the bed with no acute distress.  EYES: Pupils equal, round, reactive to light and accommodation. No scleral icterus. Extraocular muscles intact.  HEENT: Head atraumatic, normocephalic. Oropharynx and nasopharynx clear.  NECK:  Supple, no jugular venous distention. No thyroid enlargement, no tenderness.  LUNGS: Normal breath sounds bilaterally, no wheezing, rales,rhonchi or crepitation. No use of accessory muscles of respiration.  CARDIOVASCULAR:  S1, S2 normal. No murmurs, rubs, or gallops.  ABDOMEN: Soft, slight lower abdominal tenderness, distended. Bowel sounds decreased. No organomegaly or mass.  EXTREMITIES: 2+ pedal edema, no cyanosis, or clubbing.  NEUROLOGIC: Cranial nerves II through XII are intact. Muscle strength 5/5 in all extremities. Sensation intact. Gait not checked.  PSYCHIATRIC: The  patient is alert.  SKIN: No rash, lesion, or ulcer.   LABORATORY PANEL:   CBC  Recent Labs Lab 01/30/15 1540  WBC 9.6  HGB 5.3*  HCT 20.7*  PLT 454*   ------------------------------------------------------------------------------------------------------------------  Chemistries   Recent Labs Lab 01/30/15 1540  NA 127*  K 3.2*  CL 100*  CO2 21*  GLUCOSE 153*  BUN 9  CREATININE 0.61  CALCIUM 7.9*  AST 16  ALT 13*  ALKPHOS 44  BILITOT 0.9   ------------------------------------------------------------------------------------------------------------------   EKG:   Sinus rhythm 88 beats per minute right axis deviation  IMPRESSION AND PLAN:   1. Severe anemia. I will send off a ferritin. On last hospitalization she did have an abdominal mass. I will get a CT scan of the abdomen and pelvis. I spoke to the patient about transfusion of blood that the ER physician ordered and she is hesitant on this. Family will talk to her further about this. Benefits and risks explained. 2. Nausea and vomiting with abdominal distention and history of abdominal mass. We'll get a flat and upright x-ray. When necessary Zofran and Phenergan. Patient may end up needing an NG tube. I will get a CT scan of the abdomen and pelvis tomorrow after I settle down nausea and give transfusion. Empiric Protonix. 3. Hyponatremia and hypokalemia we'll give normal saline hydration with potassium 4. Impaired fasting glucose check a hemoglobin A1c 5. 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 Management plans discussed with the patient, family and they are in agreement.  CODE STATUS: Full code  TOTAL TIME TAKING CARE OF THIS PATIENT: 55  minutes.    Loletha Grayer M.D on 01/30/2015 at 5:34 PM  Between 7am to 6pm - Pager - [phone]  After 6pm call admission pager [facility]  Office   [phone]  CC: Primary care physician; No PCP Per Patient

## 2015-01-30 NOTE — ED Notes (Signed)
Continues to vomit small amounts of mucous. Med IV.

## 2015-01-30 NOTE — ED Notes (Signed)
Patient states she does not want to take blood transfusion. When questioned why patient states fear of infection as reason for refusal. Statistics for infection stated to patient. Pt at this point still states she does not want transfusion. Family at bedside and aware. Pt advised blood for transfusion will be available if she decides she wishes to take it.

## 2015-01-30 NOTE — ED Provider Notes (Signed)
[facility] Emergency Department Provider Note    ____________________________________________  Time seen: 1520  I have reviewed the triage vital signs and the nursing notes.   HISTORY  Chief Complaint Emesis   History limited by: Not Limited   HPI [name] is a 75 y.o. female with history of anemia, who presents to the emergency department today because of nausea and vomiting. The patient states that the nausea started today. It started suddenly. She was sitting on the side of her bed when it started. It had been constant until she vomited. She states that after vomiting and made the nausea worse. The patient denied any associated abdominal pain. She denied any recent diarrhea. She denies any recent fevers. The patient does state that she has felt increasingly weak over the past month. She states she thinks she might of been having some bloody stools.     No past medical history on file.  There are no active problems to display for this patient.   No past surgical history on file.  Current Outpatient Rx  Name  Route  Sig  Dispense  Refill  . acetaminophen (TYLENOL) 325 MG tablet   Oral   Take 650 mg by mouth every 6 (six) hours as needed for moderate pain.         . diphenhydrAMINE (BENADRYL) 25 MG tablet   Oral   Take 1 tablet (25 mg total) by mouth every 6 (six) hours.   20 tablet   0   . ibuprofen (ADVIL,MOTRIN) 400 MG tablet   Oral   Take 1 tablet (400 mg total) by mouth every 6 (six) hours as needed.   15 tablet   0     Allergies Review of patient's allergies indicates no known allergies.  No family history on file.  Social History Social History  Substance Use Topics  . Smoking status: Never Smoker   . Smokeless tobacco: Not on file  . Alcohol Use: No    Review of Systems  Constitutional: Negative for fever. Cardiovascular: Negative for chest pain. Respiratory: Negative for shortness of  breath. Gastrointestinal: Negative for abdominal pain. Positive for nausea and emesis. Neurological: Negative for headaches, focal weakness or numbness.   10-point ROS otherwise negative.  ____________________________________________   PHYSICAL EXAM:  VITAL SIGNS:  97.2 F (36.2 C)  85  20  138/82 mmHg  97 %    Constitutional: Alert and oriented. Slightly somnolent Eyes: Conjunctivae are normal. PERRL. Normal extraocular movements. ENT   Head: Normocephalic and atraumatic.   Nose: No congestion/rhinnorhea.   Mouth/Throat: Mucous membranes are moist.   Neck: No stridor. Hematological/Lymphatic/Immunilogical: No cervical lymphadenopathy. Cardiovascular: Normal rate, regular rhythm.  No murmurs, rubs, or gallops. Respiratory: Normal respiratory effort without tachypnea nor retractions. Breath sounds are clear and equal bilaterally. No wheezes/rales/rhonchi. Gastrointestinal: Soft and nontender. No distention. GUIAC negative. Brown stool on glove. Genitourinary: Deferred Musculoskeletal: Normal range of motion in all extremities. No joint effusions. Bilateral 2+ lower extremity pitting edema. Neurologic:  Normal speech and language. No gross focal neurologic deficits are appreciated.  Skin:  Skin is warm, dry and intact. No rash noted. Psychiatric: Mood and affect are normal. Speech and behavior are normal. Patient exhibits appropriate insight and judgment.  ____________________________________________    LABS (pertinent positives/negatives)  Labs Reviewed  CBC WITH DIFFERENTIAL/PLATELET - Abnormal; Notable for the following:    Hemoglobin 5.3 (*)    HCT 20.7 (*)    MCV 46.9 (*)    [facility]  12.1 (*)    MCHC 25.8 (*)    RDW 22.3 (*)    Platelets 454 (*)    Neutro Abs 7.4 (*)    All other components within normal limits  COMPREHENSIVE METABOLIC PANEL - Abnormal; Notable for the following:    Sodium 127 (*)    Potassium 3.2 (*)    Chloride 100 (*)    CO2 21 (*)     Glucose, Bld 153 (*)    Calcium 7.9 (*)    Albumin 3.0 (*)    ALT 13 (*)    All other components within normal limits  URINALYSIS COMPLETEWITH MICROSCOPIC ([facility] ONLY) - Abnormal; Notable for the following:    Color, Urine YELLOW (*)    APPearance CLEAR (*)    Ketones, ur TRACE (*)    Hgb urine dipstick 3+ (*)    Protein, ur 30 (*)    Leukocytes, UA TRACE (*)    Squamous Epithelial / LPF 0-5 (*)    All other components within normal limits  LIPASE, BLOOD  FERRITIN  VITAMIN B12  URINALYSIS COMPLETEWITH MICROSCOPIC ([facility] ONLY)  BASIC METABOLIC PANEL  CBC  TYPE AND SCREEN  ABO/RH  PREPARE RBC (CROSSMATCH)     ____________________________________________   EKG  I, Nance Pear, attending physician, personally viewed and interpreted this EKG  EKG Time: 1528 Rate: 85 Rhythm: Normal sinus rhythm Axis: Right axis deviation Intervals: qtc 462 QRS: Narrow ST changes: No ST elevation Impression: Abnormal EKG   ____________________________________________    RADIOLOGY  None   ____________________________________________   PROCEDURES  Procedure(s) performed: None  Critical Care performed: Yes, see critical care note(s)  CRITICAL CARE Performed by: Nance Pear   Total critical care time: 30 minutes  Critical care time was exclusive of separately billable procedures and treating other patients.  Critical care was necessary to treat or prevent imminent or life-threatening deterioration.  Critical care was time spent personally by me on the following activities: development of treatment plan with patient and/or surrogate as well as nursing, discussions with consultants, evaluation of patient's response to treatment, examination of patient, obtaining history from patient or surrogate, ordering and performing treatments and interventions, ordering and review of laboratory studies, ordering and review of radiographic studies, pulse oximetry and  re-evaluation of patient's condition.  ____________________________________________   INITIAL IMPRESSION / ASSESSMENT AND PLAN / ED COURSE  Pertinent labs & imaging results that were available during my care of the patient were reviewed by me and considered in my medical decision making (see chart for details).  Patient presented to the emergency department today because of nausea and vomiting. In talking with the patient she also has had increased weakness. Patient's blood work notable for severe anemia. I think this likely explains the patient's weakness. Patient was guaiac negative. Will plan on admission to the hospitalist service and will plan for transfusion. Discussed finding with patient and family.  ____________________________________________   FINAL CLINICAL IMPRESSION(S) / ED DIAGNOSES  Final diagnoses:  Anemia, unspecified anemia type  Nausea and vomiting, vomiting of unspecified type     Nance Pear, MD 01/30/15 1736

## 2015-01-31 ENCOUNTER — Inpatient Hospital Stay: Payer: Medicare Other

## 2015-01-31 LAB — CBC
HCT: 28.2 % — ABNORMAL LOW (ref 35.0–47.0)
HEMOGLOBIN: 7.8 g/dL — AB (ref 12.0–16.0)
MCH: 14.8 pg — AB (ref 26.0–34.0)
MCHC: 27.6 g/dL — ABNORMAL LOW (ref 32.0–36.0)
MCV: 53.6 fL — AB (ref 80.0–100.0)
Platelets: 448 10*3/uL — ABNORMAL HIGH (ref 150–440)
RBC: 5.25 MIL/uL — AB (ref 3.80–5.20)
RDW: 23 % — ABNORMAL HIGH (ref 11.5–14.5)
WBC: 8.2 10*3/uL (ref 3.6–11.0)

## 2015-01-31 LAB — BASIC METABOLIC PANEL
ANION GAP: 8 (ref 5–15)
BUN: 8 mg/dL (ref 6–20)
CHLORIDE: 105 mmol/L (ref 101–111)
CO2: 23 mmol/L (ref 22–32)
Calcium: 8.6 mg/dL — ABNORMAL LOW (ref 8.9–10.3)
Creatinine, Ser: 0.5 mg/dL (ref 0.44–1.00)
GFR calc non Af Amer: >60 mL/min (ref >60)
Glucose, Bld: 125 mg/dL — ABNORMAL HIGH (ref 65–99)
Potassium: 3.9 mmol/L (ref 3.5–5.1)
Sodium: 136 mmol/L (ref 135–145)

## 2015-01-31 LAB — HEMOGLOBIN: Hemoglobin: 7.4 g/dL — ABNORMAL LOW (ref 12.0–16.0)

## 2015-01-31 LAB — VITAMIN B12: Vitamin B-12: 200 pg/mL (ref 180–914)

## 2015-01-31 MED ORDER — PROCHLORPERAZINE EDISYLATE 5 MG/ML IJ SOLN: 10.0000 mg | Freq: Four times a day (QID) | INTRAMUSCULAR | Status: DC | PRN

## 2015-01-31 MED ORDER — IOHEXOL 240 MG/ML SOLN: 25.0000 mL | INTRAMUSCULAR | Status: AC

## 2015-01-31 MED ORDER — FERROUS SULFATE 325 (65 FE) MG PO TABS
325.0000 mg | ORAL_TABLET | Freq: Two times a day (BID) | ORAL | Status: DC
  Administered 2015-01-31 – 2015-02-04 (×7): 325 mg via ORAL
  Filled 2015-01-31 (×8): qty 1

## 2015-01-31 MED ORDER — IOHEXOL 300 MG/ML  SOLN
100.0000 mL | Freq: Once | INTRAMUSCULAR | Status: AC | PRN
  Administered 2015-01-31: 100 mL via INTRAVENOUS

## 2015-01-31 NOTE — Plan of Care (Signed)
Problem: Safety: Goal: Ability to remain free from injury will improve Outcome: Progressing Pt is high falls, bed alarm in use, call bell and phone within reach, hourly safety rounds.  Pt remains free of injury this shift  Problem: Physical Regulation: Goal: Ability to maintain clinical measurements within normal limits will improve Outcome: Progressing VSS, pt sleeping most of the day, tolerating full liquid diet with no nausea or vomiting.

## 2015-01-31 NOTE — Progress Notes (Signed)
[facility] [place] NAME: [name]    MR#:  [id number]  DATE OF BIRTH:  07-16-1940  SUBJECTIVE:  CHIEF COMPLAINT:   Chief Complaint  Patient presents with  . Emesis   the patient is a 75 year old Caucasian female with past medical history 7. History of anemia who presents to the hospital with abdominal distention, nausea, vomiting for 1 day . Apparently patient was told that she has abdominal mass for which she was recommended to have a hysterectomy and cystoscopy, but she refused both procedures . On arrival to emergency room, she was noted to be anemic with hemoglobin level of 5.3 , her ferritin level was found to be only 5 . Patient was transfused packed red blood cells after which hemoglobin level improved to 7.8 . She is very somnolent, barely arousable and not able to review systems  Review of Systems  Unable to perform ROS: medical condition    VITAL SIGNS: Blood pressure 163/80, pulse 83, temperature 97.8 F (36.6 C), temperature source Oral, resp. rate 19, height 5\' 4"  (1.626 m), weight 124.059 kg (273 lb 8 oz), SpO2 100 %.  PHYSICAL EXAMINATION:   GENERAL:  76 y.o.-year-old patient lying in the bin mild-to-moderate respiratory distress.  snoring respirations, barely opens her eyes and briefly  Answers few questions ,  agreeable for CT scanning  EYES: Pupils equal, round, reactive to light and accommodation. No scleral icterus. Extraocular muscles intact.  HEENT: Head atraumatic, normocephalic. Oropharynx and nasopharynx clear.  NECK:  Supple, no jugular venous distention. No thyroid enlargement, no tenderness.  LUNGS: Normal breath sounds bilaterally, no wheezing, rales,rhonchi or crepitation. No use of accessory muscles of respiration.  CARDIOVASCULAR: S1, S2 normal. No murmurs, rubs, or gallops.  ABDOMEno significant tenderness was noted, however. Abdomen is distended, some fluid wave or . Bowel sounds present. No  organomegaly or mass were felt, however, evaluation is suboptimal due to patient's positioning on her abdomen  EXTREMITI1+ lower extremity and pedal edema but no cyanosis  or clubbing.  NEUROLOGIC: Cranial nerves II through XII are intact. Muscle strength  not able to evaluate due to positioning in all extremities. Sensation grossly  intact. Gait not checked.  PSYCHIATRIC: The patient is very somnolent, but oriented x 3.  SKIN: No obvious rash, lesion, or ulcer.   ORDERS/RESULTS REVIEWED:   CBC  Recent Labs Lab 01/30/15 1540 01/31/15 0518  WBC 9.6 8.2  HGB 5.3* 7.8*  HCT 20.7* 28.2*  PLT 454* 448*  MCV 46.9* 53.6*  MCH 12.1* 14.8*  MCHC 25.8* 27.6*  RDW 22.3* 23.0*  LYMPHSABS 1.3  --   MONOABS 0.7  --   EOSABS 0.1  --   BASOSABS 0.1  --    ------------------------------------------------------------------------------------------------------------------  Chemistries   Recent Labs Lab 01/30/15 1540 01/31/15 0518  NA 127* 136  K 3.2* 3.9  CL 100* 105  CO2 21* 23  GLUCOSE 153* 125*  BUN 9 8  CREATININE 0.61 0.50  CALCIUM 7.9* 8.6*  AST 16  --   ALT 13*  --   ALKPHOS 44  --   BILITOT 0.9  --    ------------------------------------------------------------------------------------------------------------------ estimated creatinine clearance is 82.8 mL/min (by C-G formula based on Cr of 0.5). ------------------------------------------------------------------------------------------------------------------ No results for input(s): TSH, T4TOTAL, T3FREE, THYROIDAB in the last 72 hours.  Invalid input(s): FREET3  Cardiac Enzymes No results for input(s): CKMB, TROPONINI, MYOGLOBIN in the last 168 hours.  Invalid input(s): CK ------------------------------------------------------------------------------------------------------------------ Invalid input(s):  POCBNP ---------------------------------------------------------------------------------------------------------------  RADIOLOGY: Ct Abdomen Pelvis W Contrast  01/31/2015  CLINICAL DATA:  Abdominal distention EXAM: CT ABDOMEN AND PELVIS WITH CONTRAST TECHNIQUE: Multidetector CT imaging of the abdomen and pelvis was performed using the standard protocol following bolus administration of intravenous contrast. CONTRAST:  137mL OMNIPAQUE IOHEXOL 300 MG/ML  SOLN COMPARISON:  07/26/2013 FINDINGS: There is extensive stranding throughout the patient's fat, including intraperitoneal and subcutaneous fat. Small bilateral pleural effusions and bibasilar atelectasis. There is a moderate amount of ascites in the abdomen. No obvious evidence of omental caking. Liver is grossly within normal limits The lesion in the spleen previously visualized is obscured. Kidneys, adrenal glands, and pancreas are grossly within normal limits The left adnexal mass has markedly enlarged, now measuring 16.9 cm and previously measuring 8 cm. It is heterogeneous with solid common calcified, and and fluid elements. The metal object within the uterus is again noted and is stable. Bladder is unremarkable. No vertebral compression deformity. 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 IMPRESSION: Left adnexal mass has markedly enlarged worrisome for malignancy. It is associated with ascites. Peritoneal spread of tumor cannot be excluded. There is no obvious omental caking. There are small pleural effusions and diffuse adipose edema suggesting anasarca. Electronically Signed   By: Marybelle Killings M.D.   On: 01/31/2015 11:22   US Venous Img Lower Unilateral Left  01/31/2015  CLINICAL DATA:  75 year old female with left leg pain and swelling for 1 month. EXAM: LEFT LOWER EXTREMITY VENOUS DOPPLER ULTRASOUND TECHNIQUE: Gray-scale sonography with graded compression, as well as color Doppler and duplex ultrasound were performed to evaluate  the lower extremity deep venous systems from the level of the common femoral vein and including the common femoral, femoral, profunda femoral, popliteal and calf veins including the posterior tibial, peroneal and gastrocnemius veins when visible. The superficial great saphenous vein was also interrogated. Spectral Doppler was utilized to evaluate flow at rest and with distal augmentation maneuvers in the common femoral, femoral and popliteal veins. COMPARISON:  None. FINDINGS: Deep venous system appears patent and compressible from groin through popliteal fossae. Spontaneous venous flow present with evidence of respiratory phasicity. Augmentation intact. No intraluminal thrombus identified. Visualized portions of the greater saphenous veins patent. The right common femoral vein is patent without DVT. IMPRESSION: No evidence of left lower extremity deep venous thrombosis. Electronically Signed   By: Margarette Canada M.D.   On: 01/31/2015 09:39    EKG:  Orders placed or performed during the hospital encounter of 01/30/15  . EKG 12-Lead  . EKG 12-Lead    ASSESSMENT AND PLAN:  Active Problems:   Anemia 1. Severe symptomatic Iron  deficiency anemia, likely due to bleed from uterus as well as bladder, questionable gastrointestinal tract , that this was packed red blood cell transfusion with improvement of hemoglobin. Follow hemoglobin level tomorrow morning and transfuse patient as needed. Supplement iron  orally 2. Vaginal bleed , getting GYN involved for further recommendations , as well as palliative care to discuss this patient her options  3. 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 Hyponatremia , likely dehydration related, improved with IV fluid administration  5. Hypokalemia, resolved with IV fluids  6. Thrombocytosis , reactive, likely  follow clinically and lab wise  7. Intractable nausea and vomiting , supportive therapy, likely ovarian mass and ascites related  8. Hematuria ,  no obvious infection. Patient would benefit from cystoscopy. However, she refused in the past, following closely    Management plans discussed with the patient, family and they are in agreement.  the patient's care was extensively discussed this patient's daughter in law, time spent approximately 15 minutes in discussions  DRUG ALLERGIES: No Known Allergies  CODE STATUS:     Code Status Orders        Start     Ordered   01/30/15 1733  Full code   Continuous     01/30/15 1733      TOTAL TIME TAKING CARE OF THIS PATIENT: 44minutes.   the patient's care was extensively discussed this patient's daughter in law, time spent approximately 15 minutes in discussions  Johnasia Liese M.D on 01/31/2015 at 3:03 PM  Between 7am to 6pm - Pager - [phone]  After 6pm go to [email or web address] - password [email or web address] [facility]  [facility]  Office  [phone]  CC: Primary care physician; No PCP Per Patient

## 2015-01-31 NOTE — Progress Notes (Signed)
Pt finished contrast.  CT made aware and will be calling for transport to CT.  Clarise Cruz, RN

## 2015-01-31 NOTE — Plan of Care (Signed)
Problem: Education: Goal: Knowledge of [facility] Education information/materials will improve Outcome: Progressing Education provided to patient on treatment plans.  Education provided on need for NG tube due to projectile vomiting and aspiration.  Patient combative and uncooperative.  NG tube removed by patient after two separate attempts at placement.  Tube was in long enough to remove aspiration contents and patient without emesis for remainder of shift.    Problem: Safety: Goal: Ability to remain free from injury will improve Outcome: Progressing Patient arrived via EMS after found unresponsive in yard after fall.  Patient with Hgb of 5.3, very weak and unsteady on feet.  Patient education provided on need to remain in bed.  Education on use of call light.  Bed alarm on throughout shift.  Problem: Physical Regulation: Goal: Ability to maintain clinical measurements within normal limits will improve Outcome: Progressing Patient with code sepsis on admission and Hgb of 5.3.  Patient received 2 units of PRBC and tolerated well.  Patient afebrile and VSS.  BP 158/77 mmHg  Pulse 83  Temp(Src) 97.5 F (36.4 C) (Oral)  Resp 20  Ht 5\' 4"  (1.626 m)  Wt 273 lb 8 oz (124.059 kg)  BMI 46.92 kg/m2  SpO2 100%

## 2015-01-31 NOTE — Progress Notes (Signed)
Spoke with Dr. Ether Griffins, given order for full liquid diet and discontinue cardiac monitor and q 4hr vitals.  Routine vitals will continue.  Dr. Ether Griffins to put in consults.  Clarise Cruz, RN

## 2015-02-01 LAB — HEMOGLOBIN: Hemoglobin: 7.7 g/dL — ABNORMAL LOW (ref 12.0–16.0)

## 2015-02-01 MED ORDER — HYDROCODONE-ACETAMINOPHEN 5-325 MG PO TABS
1.0000 | ORAL_TABLET | Freq: Four times a day (QID) | ORAL | Status: DC | PRN
  Administered 2015-02-01 – 2015-02-03 (×4): 2 via ORAL
  Filled 2015-02-01 (×5): qty 2

## 2015-02-01 MED ORDER — PANTOPRAZOLE SODIUM 40 MG PO TBEC
40.0000 mg | DELAYED_RELEASE_TABLET | Freq: Once | ORAL | Status: AC
  Administered 2015-02-01: 10:00:00 40 mg via ORAL
  Filled 2015-02-01: qty 1

## 2015-02-01 MED ORDER — PANTOPRAZOLE SODIUM 40 MG PO TBEC
40.0000 mg | DELAYED_RELEASE_TABLET | Freq: Two times a day (BID) | ORAL | Status: DC
Start: 2015-02-01 — End: 2015-02-04
  Administered 2015-02-01 – 2015-02-04 (×4): 40 mg via ORAL
  Filled 2015-02-01 (×5): qty 1

## 2015-02-01 NOTE — Progress Notes (Signed)
PT Cancellation Note  Patient Details Name: [name] MRN: [id number] DOB: 1940/12/17   Cancelled Treatment:    Reason Eval/Treat Not Completed: Patient at procedure or test/unavailable.  Nursing reports pt off floor (pt gone to [facility] gynecology appointment).  Will re-attempt PT eval at a later date/time.   Raquel Sarna Clever Geraldo 02/01/2015, 3:38 PM Leitha Bleak, [place]

## 2015-02-01 NOTE — Progress Notes (Signed)
Pt transported to [facility] gynecology appointment. Clarise Cruz, RN

## 2015-02-01 NOTE — Progress Notes (Signed)
[facility] [place] NAME: [name]    MR#:  [id number]  DATE OF BIRTH:  1940/08/10  SUBJECTIVE:  CHIEF COMPLAINT:   Chief Complaint  Patient presents with  . Emesis   - Patient complaining of weakness. Received blood transfusion on admission and now hemoglobin is stable around 7.5. -Awaiting oncology evaluation  REVIEW OF SYSTEMS:  Review of Systems  Constitutional: Positive for malaise/fatigue. Negative for fever and chills.  HENT: Negative for ear discharge, ear pain and nosebleeds.   Eyes: Negative for blurred vision.  Respiratory: Negative for cough, shortness of breath and wheezing.   Cardiovascular: Negative for chest pain, palpitations and leg swelling.  Gastrointestinal: Negative for nausea, vomiting, abdominal pain, diarrhea and constipation.  Genitourinary: Negative for dysuria.  Neurological: Negative for dizziness, seizures and headaches.  Psychiatric/Behavioral: Negative for depression.    DRUG ALLERGIES:  No Known Allergies  VITALS:  Blood pressure 153/80, pulse 97, temperature 98.9 F (37.2 C), temperature source Oral, resp. rate 20, height 5\' 4"  (1.626 m), weight 124.059 kg (273 lb 8 oz), SpO2 99 %.  PHYSICAL EXAMINATION:  Physical Exam  GENERAL:  75 y.o.-year-old obese patient lying in the bed with no acute distress.  EYES: Pupils equal, round, reactive to light and accommodation. No scleral icterus. Extraocular muscles intact.  HEENT: Head atraumatic, normocephalic. Oropharynx and nasopharynx clear.  NECK:  Supple, no jugular venous distention. No thyroid enlargement, no tenderness.  LUNGS: Normal breath sounds bilaterally, no wheezing, rales,rhonchi or crepitation. No use of accessory muscles of respiration.  CARDIOVASCULAR: S1, S2 normal. No murmurs, rubs, or gallops.  ABDOMEN: Abdomen is distended, Soft, non-tender. Bowel sounds present. No organomegaly or mass.  EXTREMITIES: No pedal edema,  cyanosis, or clubbing.  NEUROLOGIC: Cranial nerves II through XII are intact. Muscle strength 5/5 in all extremities. Sensation intact. Gait not checked.  PSYCHIATRIC: The patient is alert and oriented x 3.  SKIN: No obvious rash, lesion, or ulcer.    LABORATORY PANEL:   CBC  Recent Labs Lab 01/31/15 0518  02/01/15 0538  WBC 8.2  --   --   HGB 7.8*  < > 7.7*  HCT 28.2*  --   --   PLT 448*  --   --   < > = values in this interval not displayed. ------------------------------------------------------------------------------------------------------------------  Chemistries   Recent Labs Lab 01/30/15 1540 01/31/15 0518  NA 127* 136  K 3.2* 3.9  CL 100* 105  CO2 21* 23  GLUCOSE 153* 125*  BUN 9 8  CREATININE 0.61 0.50  CALCIUM 7.9* 8.6*  AST 16  --   ALT 13*  --   ALKPHOS 44  --   BILITOT 0.9  --    ------------------------------------------------------------------------------------------------------------------  Cardiac Enzymes No results for input(s): TROPONINI in the last 168 hours. ------------------------------------------------------------------------------------------------------------------  RADIOLOGY:  Ct Abdomen Pelvis W Contrast  01/31/2015  CLINICAL DATA:  Abdominal distention EXAM: CT ABDOMEN AND PELVIS WITH CONTRAST TECHNIQUE: Multidetector CT imaging of the abdomen and pelvis was performed using the standard protocol following bolus administration of intravenous contrast. CONTRAST:  119mL OMNIPAQUE IOHEXOL 300 MG/ML  SOLN COMPARISON:  07/26/2013 FINDINGS: There is extensive stranding throughout the patient's fat, including intraperitoneal and subcutaneous fat. Small bilateral pleural effusions and bibasilar atelectasis. There is a moderate amount of ascites in the abdomen. No obvious evidence of omental caking. Liver is grossly within normal limits The lesion in the spleen previously visualized is obscured. Kidneys, adrenal glands, and pancreas  are grossly  within normal limits The left adnexal mass has markedly enlarged, now measuring 16.9 cm and previously measuring 8 cm. It is heterogeneous with solid common calcified, and and fluid elements. The metal object within the uterus is again noted and is stable. Bladder is unremarkable. No vertebral compression deformity. 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 IMPRESSION: Left adnexal mass has markedly enlarged worrisome for malignancy. It is associated with ascites. Peritoneal spread of tumor cannot be excluded. There is no obvious omental caking. There are small pleural effusions and diffuse adipose edema suggesting anasarca. Electronically Signed   By: Marybelle Killings M.D.   On: 01/31/2015 11:22   US Venous Img Lower Unilateral Left  01/31/2015  CLINICAL DATA:  75 year old female with left leg pain and swelling for 1 month. EXAM: LEFT LOWER EXTREMITY VENOUS DOPPLER ULTRASOUND TECHNIQUE: Gray-scale sonography with graded compression, as well as color Doppler and duplex ultrasound were performed to evaluate the lower extremity deep venous systems from the level of the common femoral vein and including the common femoral, femoral, profunda femoral, popliteal and calf veins including the posterior tibial, peroneal and gastrocnemius veins when visible. The superficial great saphenous vein was also interrogated. Spectral Doppler was utilized to evaluate flow at rest and with distal augmentation maneuvers in the common femoral, femoral and popliteal veins. COMPARISON:  None. FINDINGS: Deep venous system appears patent and compressible from groin through popliteal fossae. Spontaneous venous flow present with evidence of respiratory phasicity. Augmentation intact. No intraluminal thrombus identified. Visualized portions of the greater saphenous veins patent. The right common femoral vein is patent without DVT. IMPRESSION: No evidence of left lower extremity deep venous thrombosis. Electronically Signed    By: Margarette Canada M.D.   On: 01/31/2015 09:39    EKG:   Orders placed or performed during the hospital encounter of 01/30/15  . EKG 12-Lead  . EKG 12-Lead    ASSESSMENT AND PLAN:   75 year old Caucasian female with past medical history significant for anemia, menorrhagia comes to the hospital secondary to abdominal pain and weakness.  #1 Acute on chronic anemia- hb down to 5.3 on adm - MCV is very low- likely slow blood loss - received 2 units PRBC Tx and now hb stable at 7.7 today - stool for occult blood ordered. Refused colonoscopy in the past - f/u CEA - monitor hb and continue iron supplements at discharge  # 2 Left adnexal mass-likely malignant. - awaiting oncology consult. -CEA and CA 125 level is pending  #3 GERD- protonix  #4 DVT Prophylaxis- Teds and scds  #5 Menorrhagia- gyn follow up today, prior polyp in uterus biopsy- only hyperplasia- no malignancy at that time.  Physical therapy consulted    All the records are reviewed and case discussed with Care Management/Social Workerr. Management plans discussed with the patient, family and they are in agreement.  CODE STATUS: Full Code  TOTAL TIME TAKING CARE OF THIS PATIENT: 35 minutes.   POSSIBLE D/C IN 2 DAYS, DEPENDING ON CLINICAL CONDITION.   Gladstone Lighter M.D on 02/01/2015 at 2:54 PM  Between 7am to 6pm - Pager - [phone]  After 6pm go to [email or web address] - password [email or web address] [facility]  [facility] [place]  Office  [phone]  CC: Primary care physician; No PCP Per Patient

## 2015-02-01 NOTE — Care Management (Signed)
Admitted to [facility] with the diagnosis of anemia. Lives with son and daughter-in-law. Daughter-in-law is [place] [phone]). Daughter-in-law works at Financial risk analyst at [employer]. No home health. No skilled facility. No home oxygen. Seen Dr. Eliberto Ivory and Schermerhorn x 1 in the past. No primary care physician. Appetite has increased since this admission. Takes care of all basic activities of daily living herself. Doesn't drive anymore. Family members help with errands. Family will transport. Shelbie Ammons RN MSN CCM Care Management [phone]

## 2015-02-01 NOTE — Care Management Important Message (Signed)
Important Message  Patient Details  Name: [name] MRN: [id number] Date of Birth: 07-Apr-1940   Medicare Important Message Given:  Yes    [name] A Emigdio Wildeman 02/01/2015, 9:36 AM

## 2015-02-01 NOTE — Progress Notes (Signed)
Spoke with Dr. Tressia Miners about pt's request for an advancement in her diet.  Both of pt's IV are also occluded.  MD changing IV protonix to po and ok given for pt to not have IV access at this time.  Clarise Cruz, RN

## 2015-02-01 NOTE — Consult Note (Signed)
      Subjective: Chief Complaint/Diagnosis:   1.iron deficiency anemia most likely secondary to blood loss from vaginal bleeding Patient has refused further workup and blood transfusion during the hospital hospitalization has signed [place] 2.  CT scan showing abnormal mass in pelvic area evaluated today by gynecologist oncologist Dr. Jacquelyne Balint HPI:    HISTORY OF PRESENT ILLNESS: [name] is a 75 year old female with no past medical history. Comes to the Emergency Department feeling dizzy since yesterday. The symptoms were worse today and fell down into the couch. Concerning this, the patient is brought to the Emergency Department. Workup in the Emergency Department: The patient is found to have hemoglobin of 6.8 with an MCV of 50. The patient denies having any bloody stools. The patient never had any colonoscopy. The patient states sees some blood after she wipes after urination, most likely from the vagina. Denies having any blood in the urine. A urinalysis shows RBCs of 426. The patient declines any transfusion. The patient states had Pap smear many years ago. .  patient did not want blood transfusion or further evaluation and signed [place] from the hospital CT scan revealed possibility of adnexal or uterine mass. Now being evaluated by Dr. Jacquelyne Balint and Dr. Shari Prows  January, 2016 Patient was admitted in hospital with hemoglobin of 5.3.  Require blood transfusion.  A repeat CT scan of abdomen which has been reviewed independently sows adnexal mass which is increasing in the size. Patient continues to be somewhat agitated. Continues to have abdominal discomfort and pain.being seen by Gynecologist.  I was asked to evaluate because of adnexal mass. Patient did not follow-up with instruction of getting surgery done during last evaluation in June of 2015.  Increasing abdominal  discomfort    Review of Systems:  General: weakness  fatigue   Performance Status (ECOG): 0  HEENT: no complaints  Lungs: cough  SOB  Cardiac: no complaints  GI: no complaints  did not have any bleeding Increasing abdominal distention Lower abdominal discomfort   GU: vaginal bleeding  Musculoskeletal: no complaints  Extremities: no complaints  Skin: no complaints  Neuro: no complaints  Endocrine: no complaints  Psych: anxiety     All the lab data has been reviewed.  CT scan of abdomen has been reviewed. There is large adnexal mass. Increase in size from the previous day scan. There is ascites. CA 125 is pending CEA is pending Will get possible evaluation by GYN oncologist tomorrow Upper and lower colonoscopy and endoscopy may be needed.

## 2015-02-01 NOTE — Progress Notes (Signed)
Problem: Safety: Goal: Ability to remain free from injury will improve Outcome: Progressing Pt is high falls, bed alarm in use, call bell and phone within reach, hourly safety rounds. Pt remains free of injury this shift  Problem: Physical Regulation: Goal: Ability to maintain clinical measurements within normal limits will improve Outcome: Progressing VSS, tolerating regular diet with no nausea or vomiting. Pt using call bell to call for assistance when needed  Pt continues to have vaginal bleeding.

## 2015-02-01 NOTE — Progress Notes (Signed)
Pt back from [facility].  Clarise Cruz, RN

## 2015-02-01 NOTE — Plan of Care (Signed)
Problem: Education: Goal: Knowledge of [facility] Education information/materials will improve Outcome: Progressing Pt is alert and oriented, disoriented to time. No c/o pain at this time. Frequent requests throughout the night.  No nausea or vommiting at this time.  Problem: Safety: Goal: Ability to remain free from injury will improve Outcome: Progressing Pt is a high fall risk, calls out for assistance when needed.

## 2015-02-02 DIAGNOSIS — R05 Cough: Secondary | ICD-10-CM

## 2015-02-02 DIAGNOSIS — R112 Nausea with vomiting, unspecified: Secondary | ICD-10-CM

## 2015-02-02 DIAGNOSIS — R531 Weakness: Secondary | ICD-10-CM

## 2015-02-02 DIAGNOSIS — E669 Obesity, unspecified: Secondary | ICD-10-CM

## 2015-02-02 DIAGNOSIS — F5089 Other specified eating disorder: Secondary | ICD-10-CM

## 2015-02-02 DIAGNOSIS — R609 Edema, unspecified: Secondary | ICD-10-CM

## 2015-02-02 DIAGNOSIS — D509 Iron deficiency anemia, unspecified: Secondary | ICD-10-CM

## 2015-02-02 DIAGNOSIS — R5383 Other fatigue: Secondary | ICD-10-CM

## 2015-02-02 DIAGNOSIS — R1909 Other intra-abdominal and pelvic swelling, mass and lump: Secondary | ICD-10-CM

## 2015-02-02 DIAGNOSIS — R109 Unspecified abdominal pain: Secondary | ICD-10-CM

## 2015-02-02 DIAGNOSIS — R42 Dizziness and giddiness: Secondary | ICD-10-CM

## 2015-02-02 DIAGNOSIS — N939 Abnormal uterine and vaginal bleeding, unspecified: Secondary | ICD-10-CM

## 2015-02-02 DIAGNOSIS — E871 Hypo-osmolality and hyponatremia: Secondary | ICD-10-CM

## 2015-02-02 DIAGNOSIS — Z9119 Patient's noncompliance with other medical treatment and regimen: Secondary | ICD-10-CM

## 2015-02-02 DIAGNOSIS — R0602 Shortness of breath: Secondary | ICD-10-CM

## 2015-02-02 DIAGNOSIS — F419 Anxiety disorder, unspecified: Secondary | ICD-10-CM

## 2015-02-02 DIAGNOSIS — Z515 Encounter for palliative care: Secondary | ICD-10-CM

## 2015-02-02 LAB — CBC
HEMATOCRIT: 26.7 % — AB (ref 35.0–47.0)
Hemoglobin: 7.3 g/dL — ABNORMAL LOW (ref 12.0–16.0)
MCH: 14.8 pg — AB (ref 26.0–34.0)
MCHC: 27.4 g/dL — AB (ref 32.0–36.0)
MCV: 54.1 fL — AB (ref 80.0–100.0)
PLATELETS: 404 10*3/uL (ref 150–440)
RBC: 4.93 MIL/uL (ref 3.80–5.20)
RDW: 23.1 % — AB (ref 11.5–14.5)
WBC: 11.6 10*3/uL — AB (ref 3.6–11.0)

## 2015-02-02 LAB — BASIC METABOLIC PANEL
ANION GAP: 7 (ref 5–15)
BUN: 7 mg/dL (ref 6–20)
CALCIUM: 8.7 mg/dL — AB (ref 8.9–10.3)
CO2: 24 mmol/L (ref 22–32)
CREATININE: 0.63 mg/dL (ref 0.44–1.00)
Chloride: 107 mmol/L (ref 101–111)
GFR calc Af Amer: >60 mL/min (ref >60)
GLUCOSE: 111 mg/dL — AB (ref 65–99)
Potassium: 4 mmol/L (ref 3.5–5.1)
Sodium: 138 mmol/L (ref 135–145)

## 2015-02-02 LAB — CA 125: CA 125: 112.6 U/mL — AB (ref 0.0–38.1)

## 2015-02-02 LAB — CEA: CEA: 1.6 ng/mL (ref 0.0–4.7)

## 2015-02-02 LAB — PREPARE RBC (CROSSMATCH)

## 2015-02-02 MED ORDER — PEG 3350-KCL-NA BICARB-NACL 420 G PO SOLR
4000.0000 mL | Freq: Once | ORAL | Status: AC
Start: 2015-02-02 — End: 2015-02-02
  Administered 2015-02-02: 20:00:00 4000 mL via ORAL
  Filled 2015-02-02: qty 4000

## 2015-02-02 MED ORDER — CALCIUM CARBONATE ANTACID 500 MG PO CHEW
1.0000 | CHEWABLE_TABLET | Freq: Four times a day (QID) | ORAL | Status: DC | PRN
  Administered 2015-02-02: 15:00:00 200 mg via ORAL
  Filled 2015-02-02: qty 1

## 2015-02-02 MED ORDER — PEG 3350-KCL-NA BICARB-NACL 420 G PO SOLR
4000.0000 mL | Freq: Once | ORAL | Status: DC
  Filled 2015-02-02: qty 4000

## 2015-02-02 MED ORDER — SODIUM CHLORIDE 0.9 % IV SOLN
Freq: Once | INTRAVENOUS | Status: AC
  Administered 2015-02-02: 16:00:00 via INTRAVENOUS

## 2015-02-02 NOTE — NC FL2 (Signed)
[place] LEVEL OF CARE SCREENING TOOL     IDENTIFICATION  Patient Name: [name] Birthdate: 09-23-1940 Sex: female Admission Date (Current Location): 01/30/2015  [place] and [place] Number:  Herbalist and Address:  [facility], [address] [place], [place] [address]      Provider Number: [id number]  Attending Physician Name and Address:  Gladstone Lighter, MD  Relative Name and Phone Number:       Current Level of Care: Hospital Recommended Level of Care: [place] Prior Approval Number:    Date Approved/Denied:   PASRR Number:  ([id number])  Discharge Plan: SNF    Current Diagnoses: Patient Active Problem List   Diagnosis Date Noted  . Pelvic mass in female 02/02/2015  . Elevated CA-125 02/02/2015  . Postmenopausal bleeding 02/02/2015  . Morbid obesity ([place]) 02/02/2015  . Anemia 01/30/2015    Orientation RESPIRATION BLADDER Height & Weight    Self, Situation, Place  Normal Incontinent 5\' 4"  (162.6 cm) 273 lbs.  BEHAVIORAL SYMPTOMS/MOOD NEUROLOGICAL BOWEL NUTRITION STATUS   (none )  (none ) Incontinent Diet (Regular Diet )  AMBULATORY STATUS COMMUNICATION OF NEEDS Skin   Extensive Assist Verbally Normal                       Personal Care Assistance Level of Assistance  Bathing, Feeding, Dressing Bathing Assistance: Limited assistance Feeding assistance: Independent Dressing Assistance: Limited assistance     Functional Limitations Info  Sight, Hearing, Speech Sight Info: Adequate Hearing Info: Adequate Speech Info: Adequate    SPECIAL CARE FACTORS FREQUENCY  PT (By licensed PT), OT (By licensed OT)     PT Frequency:  (5) OT Frequency:  (5)            Contractures      Additional Factors Info  Code Status Code Status Info:  (Full Code. )             Current Medications (02/02/2015):  This is the current hospital active medication list Current  Facility-Administered Medications  Medication Dose Route Frequency Provider Last Rate Last Dose  . 0.9 %  sodium chloride infusion   Intravenous Once Gladstone Lighter, MD      . acetaminophen (TYLENOL) tablet 650 mg  650 mg Oral Q6H PRN Loletha Grayer, MD       Or  . acetaminophen (TYLENOL) suppository 650 mg  650 mg Rectal Q6H PRN Loletha Grayer, MD      . calcium carbonate (TUMS - dosed in mg elemental calcium) chewable tablet 200 mg of elemental calcium  1 tablet Oral Q6H PRN Gladstone Lighter, MD   200 mg of elemental calcium at 02/02/15 1517  . ferrous sulfate tablet 325 mg  325 mg Oral BID WC Theodoro Grist, MD   325 mg at 02/02/15 0734  . HYDROcodone-acetaminophen (NORCO/VICODIN) 5-325 MG per tablet 1-2 tablet  1-2 tablet Oral Q6H PRN Gladstone Lighter, MD   2 tablet at 02/02/15 0733  . morphine 2 MG/ML injection 2 mg  2 mg Intravenous Q4H PRN Loletha Grayer, MD      . ondansetron [facility]) tablet 4 mg  4 mg Oral Q6H PRN Loletha Grayer, MD       Or  . ondansetron [facility]) injection 4 mg  4 mg Intravenous Q6H PRN Loletha Grayer, MD   4 mg at 01/30/15 1928  . pantoprazole (PROTONIX) EC tablet 40 mg  40 mg Oral BID AC Gladstone Lighter,  MD   40 mg at 02/02/15 0733  . polyethylene glycol-electrolytes (NuLYTELY/GoLYTELY) solution 4,000 mL  4,000 mL Oral Once Gladstone Lighter, MD      . prochlorperazine (COMPAZINE) injection 10 mg  10 mg Intravenous Q6H PRN Theodoro Grist, MD      . sodium chloride 0.9 % injection 3 mL  3 mL Intravenous Q12H Loletha Grayer, MD   3 mL at 02/02/15 [id number]     Discharge Medications: Please see discharge summary for a list of discharge medications.  Relevant Imaging Results:  Relevant Lab Results:   Additional Information  (SSN: [id number])  Darden Dates, LCSW

## 2015-02-02 NOTE — Consult Note (Signed)
NAMECASSANDREA, [name] NO.:  [id number]  MEDICAL RECORD NO.:  [id number]  LOCATION:  103A                         FACILITY:  [facility]  PHYSICIAN:  Laverta Baltimore, MDDATE OF BIRTH:  September 09, 1940  DATE OF CONSULTATION: DATE OF DISCHARGE:                                CONSULTATION   REQUESTING PHYSICIAN:  Gladstone Lighter, NP.  HISTORY OF PRESENT ILLNESS:  This is a 75 year old, gravida 4, para 4 patient, is being seen today as requested by Dr. Tressia Miners for anemia and a left adnexal mass.  The patient is well known to me.  I evaluated her 16 months ago in the office.  She presented to [facility] for emesis.  The patient denies pain.  The patient was admitted with a hemoglobin of 5.2 and after 2 units of blood transfusion, hemoglobin was 7.8.  The patient was noted to have an 8 cm left adnexal mass 16 months ago and at that time, she underwent a cervical polypectomy that showed simple hyperplasia without atypia and an endometrial biopsy that showed disordered proliferative endometrium.  The patient was sent to GYN/Oncology for a solid left adnexal mass.  It was decided that she was going to undergo colonoscopy for possible undiagnosed colon cancer and then follow on TAH-BSO and possible cancer staging.  The patient never followed up and went through with the colonoscopy and ultimately declined additional care by me at that point.  On CT scan yesterday, the patient's left adnexal mass is now 16 cm.  PAST MEDICAL HISTORY:  Anemia.  PAST SURGICAL HISTORY:  None.  FAMILY HISTORY:  Cancer in her son.  SOCIAL HISTORY:  Does not smoke.  Does not drink or use illicit drugs.  ALLERGIES:  NO KNOWN DRUG ALLERGIES.  REVIEW OF SYSTEMS:  Unremarkable except for GENITOURINARY:  The patient with a left adnexal mass.  GENERAL:  Significant for chronic anemia.  PHYSICAL EXAMINATION:  GENERAL:  A well-developed, well-nourished, obese black female. VITAL SIGNS:  Blood pressure  130/95, pulse of 93, BMI is 39.4. LUNGS:  Clear to auscultation. CARDIOVASCULAR:  Regular rate and rhythm. NECK:  No thyromegaly. ABDOMEN:  Obese, nontender.  No rebound tenderness. PELVIC:  External genitalia; no mass, no lesions.  Urethra; not prolapsed.  Vagina; normal physiologic discharge.  Cervix; no lesions. Pap smear performed.  Uterus of normal size, shape, contour.  Exam is limited based on the patient's body habitus.  Adnexa, no mass.  Again, the exam is limited based on the patient's body habitus. RECTOVAGINAL:  No mass.  Hemoccult is pending.  Endometrial biopsy, single-tooth tenaculum placed on the anterior cervix after cleansing the cervix with Betadine.  A Pipelle catheter was placed into the endometrial cavity, sounded 8 cm and adequate tissue removed.  IMPRESSION: 1. Chronic severe anemia.  Blood loss is not consistent with just     vaginal bleeding. 2. Enlarging left adnexal mass solid component, concerning for ovarian     cancer.  PLAN:  Pap smear today, endometrial biopsy, CA-125 and CEA are pending. I have recommended that if the patient wishes to pursue this expanding left adnexal mass, she will be reconsulted to GYN/Oncology and ultimately undergoes a colonoscopy before consideration for definitive  surgery.          ______________________________ Laverta Baltimore, MD     TS/MEDQ  D:  02/01/2015  T:  02/01/2015  Job:  [id number]  cc:   Gladstone Lighter, NP

## 2015-02-02 NOTE — Clinical Social Work Note (Signed)
Clinical Social Work Assessment  Patient Details  Name: [name] MRN: [id number] Date of Birth: [birth year]  Date of referral:  02/02/15               Reason for consult:  Facility Placement                Permission sought to share information with:  Family Supports Permission granted to share information::  Yes, Verbal Permission Granted  Name::     daughter in law,     Housing/Transportation Living arrangements for the past 2 months:  [place] of Information:  Adult Children Patient Interpreter Needed:  [place] Criminal Activity/Legal Involvement Pertinent to Current Situation/Hospitalization:   None Significant Relationships:  Adult Children Lives with:  Adult Children Do you feel safe going back to the place where you live?  Yes Need for family participation in patient care:  Yes (Comment)  Care giving concerns:  No care giving concerns identified.    Social Worker assessment / plan:  CSW met with pt to address consult for New SNF as recommended by PT. CSW introduced herself and explained role of social work. CSW also explained discharging to SNF with Medicare Part A only. Pt will still need a 3 night qualifying stay however only room and board will be covered. Pt will be responsible for PT. CSW provided information on the Social Security Administration for [employer] Part B and [employer] for [employer]. CSW also explained the process of Medicaid placement at SNF. CSW will initiated a SNF search and follow up with bed offers. CSW will also obtain rates for PT at SNFs. CSW also updated RNCM of pt's possible plan for returning home with home health PT.   CSW will continue to follow.   Employment status:  Retired Forensic scientist:  Medicare PT Recommendations:  [place] / Referral to community resources:  [place]  Patient/Family's Response to care:  Pt and daughter in Sports coach were appreciative  of CSW support.   Patient/Family's Understanding of and Emotional Response to Diagnosis, Current Treatment, and Prognosis:  Pt and daughter will consider all options for discharge planning.   Emotional Assessment Appearance:  Appears older than stated age Attitude/Demeanor/Rapport:  Apprehensive Affect (typically observed):  Pleasant Orientation:  Oriented to Self, Oriented to Place, Oriented to Situation, Oriented to  Time Alcohol / Substance use:  Never Used Psych involvement (Current and /or in the community):  No (Comment)  Discharge Needs  Concerns to be addressed:  Adjustment to Illness Readmission within the last 30 days:  No Current discharge risk:  Chronically ill Barriers to Discharge:  Continued Medical Work up   [employer], LCSW 02/02/2015, 4:03 PM

## 2015-02-02 NOTE — Progress Notes (Signed)
[facility] [place] NAME: [name]    MR#:  [id number]  DATE OF BIRTH:  09/09/1940  SUBJECTIVE:  CHIEF COMPLAINT:   Chief Complaint  Patient presents with  . Emesis   -Hb still low but stable around 7, received 2units Tx since admission - Gyn Onc. eval today. GI eval pending as well for EGD and colonoscopy -Patient agrees for rehabilitation   REVIEW OF SYSTEMS:  Review of Systems  Constitutional: Positive for malaise/fatigue. Negative for fever and chills.  HENT: Negative for ear discharge, ear pain and nosebleeds.   Eyes: Negative for blurred vision.  Respiratory: Negative for cough, shortness of breath and wheezing.   Cardiovascular: Negative for chest pain, palpitations and leg swelling.  Gastrointestinal: Negative for nausea, vomiting, abdominal pain, diarrhea and constipation.  Genitourinary: Negative for dysuria.  Neurological: Negative for dizziness, seizures and headaches.  Psychiatric/Behavioral: Negative for depression.    DRUG ALLERGIES:  No Known Allergies  VITALS:  Blood pressure 168/88, pulse 87, temperature 97.7 F (36.5 C), temperature source Oral, resp. rate 19, height 5\' 4"  (1.626 m), weight 124.059 kg (273 lb 8 oz), SpO2 97 %.  PHYSICAL EXAMINATION:  Physical Exam  GENERAL:  75 y.o.-year-old obese patient lying in the bed with no acute distress.  EYES: Pupils equal, round, reactive to light and accommodation. No scleral icterus. Extraocular muscles intact.  HEENT: Head atraumatic, normocephalic. Oropharynx and nasopharynx clear.  NECK:  Supple, no jugular venous distention. No thyroid enlargement, no tenderness.  LUNGS: Normal breath sounds bilaterally, no wheezing, rales,rhonchi or crepitation. No use of accessory muscles of respiration.  CARDIOVASCULAR: S1, S2 normal. No murmurs, rubs, or gallops.  ABDOMEN: Abdomen is distended, Soft, non-tender. Bowel sounds present. No organomegaly or mass.   EXTREMITIES: No pedal edema, cyanosis, or clubbing.  NEUROLOGIC: Cranial nerves II through XII are intact. Muscle strength 5/5 in all extremities. Sensation intact. Gait not checked.  PSYCHIATRIC: The patient is alert and oriented x 3.  SKIN: No obvious rash, lesion, or ulcer.    LABORATORY PANEL:   CBC  Recent Labs Lab 02/02/15 0608  WBC 11.6*  HGB 7.3*  HCT 26.7*  PLT 404   ------------------------------------------------------------------------------------------------------------------  Chemistries   Recent Labs Lab 01/30/15 1540  02/02/15 0608  NA 127*  < > 138  K 3.2*  < > 4.0  CL 100*  < > 107  CO2 21*  < > 24  GLUCOSE 153*  < > 111*  BUN 9  < > 7  CREATININE 0.61  < > 0.63  CALCIUM 7.9*  < > 8.7*  AST 16  --   --   ALT 13*  --   --   ALKPHOS 44  --   --   BILITOT 0.9  --   --   < > = values in this interval not displayed. ------------------------------------------------------------------------------------------------------------------  Cardiac Enzymes No results for input(s): TROPONINI in the last 168 hours. ------------------------------------------------------------------------------------------------------------------  RADIOLOGY:  No results found.  EKG:   Orders placed or performed during the hospital encounter of 01/30/15  . EKG 12-Lead  . EKG 12-Lead    ASSESSMENT AND PLAN:   75 year old Caucasian female with past medical history significant for anemia, menorrhagia comes to the hospital secondary to abdominal pain and weakness.  #1 Acute on chronic anemia- hb down to 5.3 on adm - MCV is very low- likely slow blood loss - received 2 units PRBC Tx and now hb stable around 7.  For symptomatic anemia, 2 more units packed RBC ordered for today. - GI consulted for EGD and colonoscopy. 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 - Appreciate oncology  consult. -GI consult pending as patient will need EGD and colonoscopy as part of the workup. -GYN oncology has seen the patient today, recommended surgery if GI workup is negative. This can be done as an outpatient in the next 2-3 weeks. Patient can be discharged to rehabilitation if GI workup is done. -CEA level is pending and CA 125 level is elevated  #3 GERD- protonix  #4 DVT Prophylaxis- Teds and scds  #5 Menorrhagia- gyn follow up on biopsy done in the office yesterday. - prior polyp in uterus biopsy- only hyperplasia- no malignancy at that time.  Physical therapy consulted. Will need rehabilitation at discharge. Patient can follow up with GYN as outpatient in the next couple of weeks after GI workup is done.    All the records are reviewed and case discussed with Care Management/Social Workerr. Management plans discussed with the patient, family and they are in agreement.  CODE STATUS: Full Code  TOTAL TIME TAKING CARE OF THIS PATIENT: 35 minutes.   POSSIBLE D/C IN 2 DAYS, DEPENDING ON CLINICAL CONDITION.   Gladstone Lighter M.D on 02/02/2015 at 1:51 PM  Between 7am to 6pm - Pager - [phone]  After 6pm go to [email or web address] - password [email or web address] [facility]  [facility]  Office  [phone]  CC: Primary care physician; No PCP Per Patient

## 2015-02-02 NOTE — Consult Note (Signed)
Reason for Consult:Anemia Referring Physician: Dr. Mardi Mainland [name] is an 75 y.o. female admitted to [facility] on 01/30/15 with Severe Anemia.  HPI: She presented to the Emergency Department on 01/30/15 for evaluation of acute projectile vomiting without associated abdominal pain, fever or chills.    Previously evaluated by Dr. Ouida Sills 16 months ago for a 8 cm left adnexal mass that is now 16 cm. She was seen by Gyn/Onc back then but refused the recommendations of having a TAH/BSO and Colonoscopy.    On admission, her hemoglobin was 5.2. It is now up to 7.8 after 2 UPRBCs. She denies hematemesis or melena. Occult stool negative on 01/31/14. CEA 1.6 and CA 125 112.6. She denies a family history of rectal or colon cancer. She has never had an EGD or Colonoscopy. GI consulted for EGD & Colonoscopy.   Past Medical History  Diagnosis Date  . Anemia     Past Surgical History  Procedure Laterality Date  . None      History reviewed. No pertinent family history.  Social History:  reports that she has never smoked. She does not have any smokeless tobacco history on file. She reports that she does not drink alcohol or use illicit drugs.  Allergies: No Known Allergies  Medications:  I have reviewed the patient's current medications. Prior to Admission:  Prescriptions prior to admission  Medication Sig Dispense Refill Last Dose  . acetaminophen (TYLENOL) 325 MG tablet Take 650 mg by mouth every 6 (six) hours as needed for moderate pain.   unknown  . Bioflavonoid Products (GRAPE SEED PO) Take 1 capsule by mouth daily.   unknown  . ferrous sulfate 325 (65 FE) MG tablet Take 325 mg by mouth 2 (two) times daily with a meal.   unknown  . ibuprofen (ADVIL,MOTRIN) 200 MG tablet Take 400 mg by mouth every 6 (six) hours as needed for fever, headache or mild pain.    unknown  . Misc Natural Products (TURMERIC CURCUMIN) CAPS Take 1 capsule by mouth daily.   unknown  . diphenhydrAMINE  (BENADRYL) 25 MG tablet Take 1 tablet (25 mg total) by mouth every 6 (six) hours. 20 tablet 0   . ibuprofen (ADVIL,MOTRIN) 400 MG tablet Take 1 tablet (400 mg total) by mouth every 6 (six) hours as needed. 15 tablet 0    Scheduled: . sodium chloride   Intravenous Once  . ferrous sulfate  325 mg Oral BID WC  . pantoprazole  40 mg Oral BID AC  . polyethylene glycol-electrolytes  4,000 mL Oral Once  . sodium chloride  3 mL Intravenous Q12H   Continuous:  DTO:[id number] **OR** acetaminophen, calcium carbonate, HYDROcodone-acetaminophen, morphine injection, ondansetron **OR** ondansetron (ZOFRAN) IV, prochlorperazine Anti-infectives    None      Results for orders placed or performed during the hospital encounter of 01/30/15 (from the past 48 hour(s))  Hemoglobin     Status: Abnormal   Collection Time: 01/31/15  6:10 PM  Result Value Ref Range   Hemoglobin 7.4 (L) 12.0 - 16.0 g/dL    Comment: RESULT REPEATED AND VERIFIED  Hemoglobin     Status: Abnormal   Collection Time: 02/01/15  5:38 AM  Result Value Ref Range   Hemoglobin 7.7 (L) 12.0 - 16.0 g/dL    Comment: RESULT REPEATED AND VERIFIED  CA 125     Status: Abnormal   Collection Time: 02/01/15  5:39 AM  Result Value Ref Range   CA 125 112.6 (H) 0.0 - 38.1  U/mL    Comment: (NOTE) Roche ECLIA methodology Performed At: [facility] [place], [place] [id number] Lindon Romp MD [id number]   CEA     Status: None   Collection Time: 02/01/15  2:27 PM  Result Value Ref Range   CEA 1.6 0.0 - 4.7 ng/mL    Comment: (NOTE)       Roche ECLIA methodology       Nonsmokers  <3.9                                     Smokers     <5.6 Performed At: [facility] [place], [place] [id number] Lindon Romp MD [id number]   CBC     Status: Abnormal   Collection Time: 02/02/15  6:08 AM  Result Value Ref Range   WBC 11.6 (H) 3.6 - 11.0 K/uL   RBC 4.93 3.80 - 5.20 MIL/uL   Hemoglobin  7.3 (L) 12.0 - 16.0 g/dL    Comment: RESULT REPEATED AND VERIFIED   HCT 26.7 (L) 35.0 - 47.0 %   MCV 54.1 (L) 80.0 - 100.0 fL   MCH 14.8 (L) 26.0 - 34.0 pg   MCHC 27.4 (L) 32.0 - 36.0 g/dL   RDW 23.1 (H) 11.5 - 14.5 %   Platelets 404 150 - 440 K/uL  Basic metabolic panel     Status: Abnormal   Collection Time: 02/02/15  6:08 AM  Result Value Ref Range   Sodium 138 135 - 145 mmol/L   Potassium 4.0 3.5 - 5.1 mmol/L   Chloride 107 101 - 111 mmol/L   CO2 24 22 - 32 mmol/L   Glucose, Bld 111 (H) 65 - 99 mg/dL   BUN 7 6 - 20 mg/dL   Creatinine, Ser 0.63 0.44 - 1.00 mg/dL   Calcium 8.7 (L) 8.9 - 10.3 mg/dL   GFR calc non Af Amer >60 >60 mL/min   GFR calc Af Amer >60 >60 mL/min    Comment: (NOTE) The eGFR has been calculated using the CKD EPI equation. This calculation has not been validated in all clinical situations. eGFR's persistently <60 mL/min signify possible Chronic Kidney Disease.    Anion gap 7 5 - 15    No results found.  Review of Systems  Constitutional: Positive for malaise/fatigue. Negative for fever, chills, weight loss and diaphoresis.  HENT: Negative for congestion, ear discharge, ear pain, hearing loss, nosebleeds, sore throat and tinnitus.   Eyes: Negative for blurred vision, double vision, photophobia, pain, discharge and redness.  Respiratory: Negative for cough, hemoptysis, sputum production, shortness of breath, wheezing and stridor.   Cardiovascular: Negative for chest pain, palpitations, orthopnea, claudication, leg swelling and PND.  Gastrointestinal: Positive for nausea and vomiting. Negative for heartburn, abdominal pain, diarrhea, constipation, blood in stool and melena.       Some burping.  Genitourinary: Negative for dysuria, urgency, frequency, hematuria and flank pain.  Musculoskeletal: Negative for myalgias, back pain, joint pain, falls and neck pain.  Skin: Negative for itching and rash.  Neurological: Negative for dizziness, tingling, tremors,  sensory change, speech change, focal weakness, seizures, loss of consciousness, weakness and headaches.  Endo/Heme/Allergies: Negative for environmental allergies and polydipsia. Does not bruise/bleed easily.  Psychiatric/Behavioral: Negative for depression, suicidal ideas, hallucinations, memory loss and substance abuse. The patient is not nervous/anxious and does not have insomnia.    Blood pressure 168/88,  pulse 87, temperature 97.7 F (36.5 C), temperature source Oral, resp. rate 19, height _0  (1.626 m), weight 124.059 kg (273 lb 8 oz), SpO2 97 %. Physical Exam  Nursing note and vitals reviewed. Constitutional: She is oriented to person, place, and time. She appears well-developed and well-nourished. No distress.  Morbidly obese.   HENT:  Head: Normocephalic and atraumatic.  Mouth/Throat: Oropharynx is clear and moist. No oropharyngeal exudate.  Eyes: EOM are normal. Right eye exhibits no discharge. Left eye exhibits discharge. No scleral icterus.  Neck: Normal range of motion. Neck supple. No JVD present. No tracheal deviation present. No thyromegaly present.  Respiratory: Effort normal and breath sounds normal. No stridor. No respiratory distress. She has no wheezes. She has no rales. She exhibits no tenderness.  GI: Soft. Bowel sounds are normal. She exhibits distension. She exhibits no mass. There is no tenderness. There is no rebound and no guarding.  Abdominal ascites.   Musculoskeletal: She exhibits edema. She exhibits no tenderness.  Bilateral trace edema.   Lymphadenopathy:    She has no cervical adenopathy.  Neurological: She is alert and oriented to person, place, and time.  Skin: Skin is warm and dry. No rash noted. She is not diaphoretic. No erythema. No pallor.  Psychiatric: She has a normal mood and affect. Her behavior is normal.    Assessment/Plan: Anemia in the setting of a large pelvic mass, abdominal ascites and elevated CA 125 level. After discussion with Dr.  Candace Cruise, an EGD & Colonoscopy has been scheduled for tomorrow to evaluate for GI origin. May not be able complete colonoscopy given large pelvic mass. May not be able to get scope passed it. I informed her of the indications, benefit and potential complications such as but not limited to infection, bleeding, perforation or reaction to medications. Patient has agreed to proceed with EGD and Colonoscopy.  Consultation performed in collaboration with Dr. Candace Cruise. Thank you for consulting [name].   Faye Ramsay, MSN, FNP-BC 02/02/2015, 2:39 PM

## 2015-02-02 NOTE — NC FL2 (Signed)
[place] LEVEL OF CARE SCREENING TOOL     IDENTIFICATION  Patient Name: Gaia Tetrault Birthdate: [birth year] Sex: female Admission Date (Current Location): 01/30/2015  [place] and [place] Number:  Herbalist and Address:  [facility], [address] [place], [place] [address]      Provider Number: [id number]  Attending Physician Name and Address:  Gladstone Lighter, MD  Relative Name and Phone Number:       Current Level of Care: Hospital Recommended Level of Care: [place] Prior Approval Number:    Date Approved/Denied:   PASRR Number:  ([id number])  Discharge Plan: SNF    Current Diagnoses: Patient Active Problem List   Diagnosis Date Noted  . Pelvic mass in female 02/02/2015  . Elevated CA-125 02/02/2015  . Postmenopausal bleeding 02/02/2015  . Anemia 01/30/2015    Orientation RESPIRATION BLADDER Height & Weight    Self, Situation, Place  Normal Incontinent 5\' 4"  (162.6 cm) 273 lbs.  BEHAVIORAL SYMPTOMS/MOOD NEUROLOGICAL BOWEL NUTRITION STATUS   (none )  (none ) Incontinent Diet (Regular Diet )  AMBULATORY STATUS COMMUNICATION OF NEEDS Skin   Extensive Assist Verbally Normal                       Personal Care Assistance Level of Assistance  Bathing, Feeding, Dressing Bathing Assistance: Limited assistance Feeding assistance: Independent Dressing Assistance: Limited assistance     Functional Limitations Info  Sight, Hearing, Speech Sight Info: Adequate Hearing Info: Adequate Speech Info: Adequate    SPECIAL CARE FACTORS FREQUENCY  PT (By licensed PT), OT (By licensed OT)     PT Frequency:  (5) OT Frequency:  (5)            Contractures      Additional Factors Info  Code Status Code Status Info:  (Full Code. )             Current Medications (02/02/2015):  This is the current hospital active medication list Current Facility-Administered Medications   Medication Dose Route Frequency Provider Last Rate Last Dose  . acetaminophen (TYLENOL) tablet 650 mg  650 mg Oral Q6H PRN Loletha Grayer, MD       Or  . acetaminophen (TYLENOL) suppository 650 mg  650 mg Rectal Q6H PRN Loletha Grayer, MD      . ferrous sulfate tablet 325 mg  325 mg Oral BID WC Theodoro Grist, MD   325 mg at 02/02/15 0734  . HYDROcodone-acetaminophen (NORCO/VICODIN) 5-325 MG per tablet 1-2 tablet  1-2 tablet Oral Q6H PRN Gladstone Lighter, MD   2 tablet at 02/02/15 0733  . morphine 2 MG/ML injection 2 mg  2 mg Intravenous Q4H PRN Loletha Grayer, MD      . ondansetron [facility]) tablet 4 mg  4 mg Oral Q6H PRN Loletha Grayer, MD       Or  . ondansetron [facility]) injection 4 mg  4 mg Intravenous Q6H PRN Loletha Grayer, MD   4 mg at 01/30/15 1928  . pantoprazole (PROTONIX) EC tablet 40 mg  40 mg Oral BID AC Gladstone Lighter, MD   40 mg at 02/02/15 0733  . prochlorperazine (COMPAZINE) injection 10 mg  10 mg Intravenous Q6H PRN Theodoro Grist, MD      . sodium chloride 0.9 % injection 3 mL  3 mL Intravenous Q12H Loletha Grayer, MD   3 mL at 02/02/15 0734     Discharge Medications: Please see discharge summary  for a list of discharge medications.  Relevant Imaging Results:  Relevant Lab Results:   Additional Information  (SSN: [id number])  Loralyn Freshwater, LCSW

## 2015-02-02 NOTE — Consult Note (Signed)
Gynecologic Oncology Consult Visit   Referring Provider: Dr Ouida Sills  Chief Concern: Pelvic mass with elevated CA125, and postmenopausal bleeding with severe iron deficiency anemia.  Subjective:  [name] is a 75 y.o. P4 female who is seen in consultation from Dr. Ouida Sills for  for pelvic mass with postmenopausal bleeding/anemia and elevated CA125.    She was admitted to [facility] in 7/15 with PMB, iron deficiency anemia and an 8 cm left pelvic mass.  CA125 was 10. PAP 08/21/14 AGUS.  Dr Ouida Sills did an endometrial bx 08/25/14 that showed polyp with simple hyperplasia and disordered proliferative endometrium.   She refused surgery offered by Dr Ouida Sills and Dr Jacquelyne Balint and was lost to follow up.    Now admitted 01/30/15 after presenting to the emergency department because of nausea and vomiting. The patient states that the nausea started suddenly. The patient denies any associated abdominal pain or any recent diarrhea. She denies any recent fevers. The patient does state that she has felt increasingly weak over the past month. She states she thinks she might of been having some bloody stools. Hemoglobin was 5.3 with MCV 46.9, Hct 20.7, Plts 454k, WBC 9,600.  Hgb 7.8 after 2 units of blood.  She has some urinary incontinence and wears a diaper.  Walks with a walker and lives with her son and daughter in [place].  She does not do much.   CT scan shows that the heterogeneous pelvic mass has grown to 17 cm.  CT also shows some ascites, but no carcinomatosis.  CA125 now 112 and CEA normal.   Dr Ouida Sills examined the patient in his office yesterday and endometrial biopsy and PAP were done.    States she had an IUD, but not sure if it was removed.    Problem List: Patient Active Problem List   Diagnosis Date Noted  . Pelvic mass in female 02/02/2015  . Elevated CA-125 02/02/2015  . Postmenopausal bleeding 02/02/2015  . Anemia 01/30/2015    Past Medical History: Past  Medical History  Diagnosis Date  . Anemia     Past Surgical History: Past Surgical History  Procedure Laterality Date  . None     Family History: History reviewed. No pertinent family history.  Son with non-Hodgkins Lymphoma  Social History: Social History   Social History  . Marital Status: Legally Separated    Spouse Name: N/A  . Number of Children: N/A  . Years of Education: N/A   Occupational History  . Not on file.   Social History Main Topics  . Smoking status: Never Smoker   . Smokeless tobacco: Not on file  . Alcohol Use: No  . Drug Use: No  . Sexual Activity: Not on file   Other Topics Concern  . Not on file   Social History Narrative    Allergies: No Known Allergies  Current Medications: Current Facility-Administered Medications  Medication Dose Route Frequency Provider Last Rate Last Dose  . acetaminophen (TYLENOL) tablet 650 mg  650 mg Oral Q6H PRN Loletha Grayer, MD       Or  . acetaminophen (TYLENOL) suppository 650 mg  650 mg Rectal Q6H PRN Loletha Grayer, MD      . ferrous sulfate tablet 325 mg  325 mg Oral BID WC Theodoro Grist, MD   325 mg at 02/02/15 0734  . HYDROcodone-acetaminophen (NORCO/VICODIN) 5-325 MG per tablet 1-2 tablet  1-2 tablet Oral Q6H PRN Gladstone Lighter, MD   2 tablet at 02/02/15 0733  .  morphine 2 MG/ML injection 2 mg  2 mg Intravenous Q4H PRN Loletha Grayer, MD      . ondansetron [facility]) tablet 4 mg  4 mg Oral Q6H PRN Loletha Grayer, MD       Or  . ondansetron [facility] injection 4 mg  4 mg Intravenous Q6H PRN Loletha Grayer, MD   4 mg at 01/30/15 1928  . pantoprazole (PROTONIX) EC tablet 40 mg  40 mg Oral BID AC Gladstone Lighter, MD   40 mg at 02/02/15 0733  . prochlorperazine (COMPAZINE) injection 10 mg  10 mg Intravenous Q6H PRN Theodoro Grist, MD      . sodium chloride 0.9 % injection 3 mL  3 mL Intravenous Q12H Loletha Grayer, MD   3 mL at 02/02/15 0734    Review of Systems General: negative for, chills,  changes in sleep, changes in weight or appetite Skin: negative for changes in color, texture, moles or lesions Eyes: negative for, changes in vision, pain, diplopia HEENT: negative for, change in hearing, pain, discharge, tinnitus, vertigo, voice changes, sore throat, neck masses Breasts: negative for breast lumps Pulmonary: negative for, dyspnea, orthopnea, productive cough Cardiac: negative for, palpitations, syncope, pain, discomfort, pressure Gastrointestinal: negative for, dysphagia, jaundice, pain, constipation, diarrhea, hematochezia Genitourinary/Sexual: negative for, dysuria, discharge, hesitancy, nocturia, retention, stones, infections, STD's, incontinence Ob/Gyn: negative for discharge Musculoskeletal: negative for, pain, stiffness, swelling, range of motion limitation Hematology: negative for, easy bruising, bleeding Neurologic/Psych: negative for, headaches, seizures, paralysis, weakness, tremor, change in gait, change in sensation, mood swings, depression, anxiety, change in memory  Objective:  Physical Examination:  BP 138/86 mmHg  Pulse 82  Temp(Src) 98.6 F (37 C) (Oral)  Resp 18  Ht 5\' 4"  (1.626 m)  Wt 273 lb 8 oz (124.059 kg)  BMI 46.92 kg/m2  SpO2 100%   ECOG Performance Status: 1 - Symptomatic but completely ambulatory  General appearance: alert, cooperative and appears stated age HEENT:PERRLA, neck supple with midline trachea and thyroid without masses Lymph node survey: non-palpable, axillary, inguinal, supraclavicular Cardiovascular: regular rate and rhythm, no murmurs or gallops Respiratory: normal air entry, lungs clear to auscultation and no rales, rhonchi or wheezing Breast exam: deferred Abdomen: protuberant, no hernias and well healed incision, skin in lower abdomen is edematous and thickened with panus present.   Back: inspection of back is normal Extremities: extremities normal, atraumatic, no cyanosis or edema Skin exam - normal coloration and  turgor, no rashes, no suspicious skin lesions noted. Neurological exam reveals alert, oriented, normal speech, no focal findings or movement disorder noted. Pelvic: exam deferred  Lab Review Lab Results  Component Value Date   CA125 112.6* 02/01/2015   Radiologic Imaging: 01/30/15 CT ABDOMEN AND PELVIS WITH CONTRAST  There is extensive stranding throughout the patient's fat, including intraperitoneal and subcutaneous fat.  Small bilateral pleural effusions and bibasilar atelectasis.  There is a moderate amount of ascites in the abdomen. No obvious evidence of omental caking. Liver is grossly within normal limits. The lesion in the spleen previously visualized is obscured. Kidneys, adrenal glands, and pancreas are grossly within normal limits  The left adnexal mass has markedly enlarged, now measuring 16.9 cm and previously measuring 8 cm. It is heterogeneous with solid common calcified, and and fluid elements.  The metal object within the uterus is again noted and is stable. Bladder is unremarkable.   IMPRESSION: Left adnexal mass has markedly enlarged worrisome for malignancy. It is associated with ascites. Peritoneal spread of tumor cannot be excluded. There is  no obvious omental caking.  There are small pleural effusions and diffuse adipose edema suggesting anasarca.    Assessment:  [name] is a 75 y.o. female diagnosed with large pelvic mass and elevated CA125 with postmenopausal bleeding and severe iron deficiency anemia.  The ovarian mass is heterogeneous and concerning for malignancy.  The PMB is also concerning for uterine cancer and Dr Melven Sartorius did a PAP and endometrial biopsy yesterday.  Endometrial biopsy in 7/15 when she was last seen with bleeding showed only a polyp with simple hyperplasia.  Medical comorbidties: morbid obesity with relative immobility.  Noncompliant with medical advice in the past.   Plan:   Problem List Items Addressed This Visit     None    Visit Diagnoses    Anemia, unspecified anemia type    -  Primary    Relevant Medications    ferrous sulfate 325 (65 FE) MG tablet    ferrous sulfate tablet 325 mg    Nausea and vomiting, vomiting of unspecified type        Vomiting        Abdominal distension        Relevant Orders    CT Abdomen Pelvis W Contrast (Completed)    Swelling        Relevant Orders    US Venous Img Lower Unilateral Left (Completed)      We will await the results of the endometrial biopsy and PAP, but in view of the large ovarian mass and uterine bleeding, surgery to remove the uterus, tubes and ovaries is likely indicated.  Possibility of surgical staging and debulking also discussed.  Alternatively, if the endometrial biopsy is benign endometrial ablation is a non-surgical option.  The ovarian mass has grown, but there is no evidence of metastatic disease although CA125 is now elevated, which it was not before.  In view of her size, immobility and abdominal wall edema/anasarca, the risk of surgical complications is significant.   She needs colonoscopy and upper endoscopy in view of iron deficiency anemia, and concern based on history that she may be having rectal bleeding in addition to vaginal bleeding.  GI was seeing her today and will arrange for endoscopies.  I discussed the management plan with Dr Ouida Sills, Dr Oliva Bustard and Dr Tressia Miners.   If the patient is going to have surgery, it will be best to give her another two units of blood and Dr Oliva Bustard is following her and planning to give IV iron.  Dr Ouida Sills and I had spoken about a possible surgery date of 1/25 at [facility], but the patient says she would prefer to have surgery at [facility].  We will make definitive plans after the endoscopy and uterine biopsy results are available.  In speaking with the patient, we got her daughter in law on speaker phone so she could be in the loop and participate in the conversation and help the patient make decisions  about her care. The patient's diagnosis, an outline of the further diagnostic and laboratory studies which will be required, the recommendation, and alternatives were discussed.  All questions were answered to the patient's satisfaction.  Mellody Drown, MD

## 2015-02-02 NOTE — Clinical Social Work Placement (Signed)
   CLINICAL SOCIAL WORK PLACEMENT  NOTE  Date:  02/02/2015  Patient Details  Name: [name] MRN: [id number] Date of Birth: 08-14-1940  Clinical Social Work is seeking post-discharge placement for this patient at the [place] level of care (*CSW will initial, date and re-position this form in  chart as items are completed):  Yes   Patient/family provided with [place] Work Department's list of facilities offering this level of care within the geographic area requested by the patient (or if unable, by the patient's family).  Yes   Patient/family informed of their freedom to choose among providers that offer the needed level of care, that participate in Medicare, Medicaid or managed care program needed by the patient, have an available bed and are willing to accept the patient.  Yes   Patient/family informed of [place]'s ownership interest in [facility] and [facility], as well as of the fact that they are under no obligation to receive care at these facilities.  PASRR submitted to EDS on 02/02/15     PASRR number received on 02/02/15     Existing PASRR number confirmed on       FL2 transmitted to all facilities in geographic area requested by pt/family on 02/02/15     FL2 transmitted to all facilities within larger geographic area on       Patient informed that his/her managed care company has contracts with or will negotiate with certain facilities, including the following:            Patient/family informed of bed offers received.  Patient chooses bed at       Physician recommends and patient chooses bed at      Patient to be transferred to   on  .  Patient to be transferred to facility by       Patient family notified on   of transfer.  Name of family member notified:        PHYSICIAN       Additional Comment:    _______________________________________________ Darden Dates, LCSW 02/02/2015, 4:10 PM

## 2015-02-02 NOTE — Plan of Care (Signed)
Problem: Physical Regulation: Goal: Will remain free from infection Outcome: Progressing Patient is alert and oriented. VSS. Pain medication given with noted relief. Blood transfusing without complications. Colonoscopy scheduled for tomorrow.

## 2015-02-02 NOTE — Evaluation (Signed)
Physical Therapy Evaluation Patient Details Name: [name] MRN: [id number] DOB: 08-24-1940 Today's Date: 02/02/2015   History of Present Illness  Pt is a 75 y.o. female presenting to hospital with vomiting x1 day and h/o abdominal mass.  Pt with severe anemia (5.3 Hgb) and now s/p 2 units PRBC's transfusion.    Clinical Impression  Prior to admission, pt was independent ambulating without AD.  Pt lives with her son and daughter-in-law on main level of home with 4 steps to enter with B railings (has a flight of stairs with railing to basement for laundry).  Currently pt is min assist with transfers and min assist (mod assist 1x d/t loss of balance to L) ambulating short distance with RW (limited distance d/t fatigue and 2 assist present for safety).  Pt demonstrates impaired safety awareness with functional mobility.  Pt would benefit from skilled PT to address noted impairments and functional limitations.  Recommend pt discharge to STR when medically appropriate.     Follow Up Recommendations SNF    Equipment Recommendations  Rolling walker with 5" wheels    Recommendations for Other Services       Precautions / Restrictions Precautions Precautions: Fall Restrictions Weight Bearing Restrictions: No      Mobility  Bed Mobility                  Transfers Overall transfer level: Needs assistance Equipment used: Rolling walker (2 wheeled) Transfers: Sit to/from Stand Sit to Stand: Min assist (x1 trial no AD; x3 trials with RW)         General transfer comment: posterior lean upon standing initially (improved with use of RW)  Ambulation/Gait Ambulation/Gait assistance: Min assist;Mod assist;+2 safety/equipment Ambulation Distance (Feet): 30 Feet Assistive device: Rolling walker (2 wheeled)   Gait velocity: decreased   General Gait Details: forward flexed posture; decreased B step length/foot clearance/heelstrike; pt counting each step out loud; limited  d/t fatigue.  3 attempts at gait (1st trial pt grabbing for wall so switched to RW but pt had loss of balance to L with RW requiring mod assist to prevent fall; pt then ambulated short distance with RW and 2 assist.  Stairs            Wheelchair Mobility    Modified Rankin (Stroke Patients Only)       Balance Overall balance assessment: Needs assistance Sitting-balance support: Bilateral upper extremity supported;Feet supported Sitting balance-Leahy Scale: Fair Sitting balance - Comments: pt sits posterior quickly   Standing balance support: Bilateral upper extremity supported;During functional activity (use of RW) Standing balance-Leahy Scale: Poor                               Pertinent Vitals/Pain Pain Assessment: No/denies pain  Vitals stable and WFL throughout treatment session.    Home Living Family/patient expects to be discharged to:: Private residence Living Arrangements: Children (Son and daughter-in-law) Available Help at Discharge: Family Type of Home: House Home Access: Stairs to enter Entrance Stairs-Rails: Right;Left;Can reach both Entrance Stairs-Number of Steps: [place]: Two level;Laundry or work area in basement;Able to live on main level with [employer]: None      Prior Function Level of Independence: Independent         Comments: Pt denies any recent falls     Hand Dominance        Extremity/Trunk Assessment   Upper Extremity Assessment: Generalized weakness  Lower Extremity Assessment: Generalized weakness         Communication   Communication: No difficulties  Cognition Arousal/Alertness: Awake/alert Behavior During Therapy:  (End of session pt alternating between whispering and talking in normal voice; pt tangential throughout session) Overall Cognitive Status:  (Oriented to person, place, time)                      General Comments   Nursing cleared pt for  participation in physical therapy.  Pt agreeable to PT session.    Exercises  Functional mobility training      Assessment/Plan    PT Assessment Patient needs continued PT services  PT Diagnosis Difficulty walking;Generalized weakness   PT Problem List Decreased strength;Decreased activity tolerance;Decreased balance;Decreased mobility;Decreased knowledge of use of DME;Decreased safety awareness;Decreased knowledge of precautions  PT Treatment Interventions DME instruction;Gait training;Stair training;Functional mobility training;Therapeutic activities;Therapeutic exercise;Balance training;Patient/family education   PT Goals (Current goals can be found in the Care Plan section) Acute Rehab PT Goals Patient Stated Goal: to get stronger PT Goal Formulation: With patient Time For Goal Achievement: 2015/02/04 Potential to Achieve Goals: Good    Frequency Min 2X/week   Barriers to discharge Decreased caregiver support      Co-evaluation               End of Session Equipment Utilized During Treatment: Gait belt Activity Tolerance: Patient limited by fatigue Patient left: in chair;with call bell/phone within reach;with chair alarm set Nurse Communication: Mobility status;Precautions         Time: [id number] PT Time Calculation (min) (ACUTE ONLY): 27 min   Charges:   PT Evaluation $PT Eval Low Complexity: 1 Procedure PT Treatments $Therapeutic Activity: 8-22 mins   PT G CodesLeitha Bleak 02-04-2015, 1:30 PM Leitha Bleak, [place]

## 2015-02-02 NOTE — Consult Note (Signed)
Palliative Medicine Inpatient Consult Note   Name: [name] Date: 02/02/2015 MRN: [id number]  DOB: 09/24/1940  Referring Physician: Gladstone Lighter, MD  Palliative Care consult requested for this 75 y.o. female for goals of medical therapy in patient with severe anemia. She was evaluated by GYN 16 mos ago for an 8 cm left adnexal mass that is now 16 cm in size. She had been advised to have this worked up and treated with a TAH/BSO and also a colonoscopy, but she refused recommendations at that time.  Her Hgb was 5.2 at admission on 01/30/2015.  Hgb is now up to 7.8.  Stool for occult blood is negative and CEA is 1.6 with CA 125 =112.  Occasional blood had been noted in bowel movements by pt.  She has had nausea and vomiting --which is what brought her here (along with near-syncopy). .  She was also to have cystocopy with Dr. Eliberto Ivory (Urology) but she refused that as well. She has been transfused 2 units of packed red blood cells. She is an English as a second language teacher and this is felt to be related to some degree to her anemia.     She was taken to GYN office yesterday where she had a PAP smear and an endometrial biopsy.  Dr. Ouida Sills (GYN) rec that she would need a colonoscopy and another office visit in the GYN office if she wants to pursue treatment for the expanding left adnexal mass that is worrisome for ovarian cancer.   PT has rec rehab, but since pt only has Part A medicare coverage, she will have room and board covered but will have to pay for PT out of pocket until such time that she is approved for Medicaid (family advised as how to apply).     TODAY'S DISCUSSIONS AND DECISIONS: I attempted to have a conversation with pt today, but to no avail. She was preoccupied with the phone 'not working' --though I was able to dial a number she wanted dialed with success.  Later, when I had barely begun to bring up code status, she said she had to get advice from 'her son'.  I dialed her son, Ed, who she  had just spoken with. Then she hollered at me and became very, very upset saying she has more than one son and she was not wanting to talk to that son, but a different son. Then I asked which son, and she hollered again saying that she never said she wanted to talk to any son at all! She said she wanted to call a friend, Barnabas Lister.  She gave me his number but when dialed it just rings a fast busy. Nursing reports that pt answers all orientation questions accurately, but she will also deny saying certain things and she also gets upset while contradicting herself.     She said she wanted me to come back tomorrow. She also said she never wanted to ever be asked if she plans on going through what we recommend because it is 'monotonous' to be asked the same thing by every doctor.  I believe my role at this juncture is marginal.  She does not yet have a cancer diagnosis and she is not ready to consider discussing code status or goals of care. I will try to talk with her again after we know more from the colonoscopy.  She said I could come back --but I am not sure an actual conversation is going to take place.    Note that most of  my visit was spent untangling her phone wire from underneath the bed --at her request.  I had hoped this would make her more willing to talk and listen, but this did not happen.     IMPRESSION: Severe symptomatic anemia ---on iron  ---presumably related to either chronic GI,  Uterine, or urinary tract  blood loss.  -- Pt to get endoscopy tomorrow.   Enlarging left adnexal mass with abdominal distension --with solid component 'worrisome for ovarian cancer' --will need outpt work up for this if she desires now to follow through --had refused workup/ treatment 6 mos ago when mass was smaller Nausea and vomiting Edema Possible rectal bleeding Hyponatremia Impaired fasting glucose Miorbid Obesity PICA (ice eating) Anxiety Medical noncompliance    REVIEW OF SYSTEMS:  Unable to  obtain due to patient unwillingness to engage in meaningful conversation today.    SPIRITUAL SUPPORT SYSTEM: Yes. Family.   SOCIAL HISTORY:  reports that she has never smoked. She does not have any smokeless tobacco history on file. She reports that she does not drink alcohol or use illicit drugs.  Lives with son and daughter-in-law.  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  No Primary care MD.  Was independent with all ADLs but does not drive anymore.  Family runs errands for her.    LEGAL DOCUMENTS:  None  CODE STATUS: Full code  PAST MEDICAL HISTORY: Past Medical History  Diagnosis Date  . Anemia     PAST SURGICAL HISTORY:  Past Surgical History  Procedure Laterality Date  . None      ALLERGIES:  has No Known Allergies.  MEDICATIONS:  Current Facility-Administered Medications  Medication Dose Route Frequency Provider Last Rate Last Dose  . 0.9 %  sodium chloride infusion   Intravenous Once Gladstone Lighter, MD      . acetaminophen (TYLENOL) tablet 650 mg  650 mg Oral Q6H PRN Loletha Grayer, MD       Or  . acetaminophen (TYLENOL) suppository 650 mg  650 mg Rectal Q6H PRN Loletha Grayer, MD      . ferrous sulfate tablet 325 mg  325 mg Oral BID WC Theodoro Grist, MD   325 mg at 02/02/15 0734  . HYDROcodone-acetaminophen (NORCO/VICODIN) 5-325 MG per tablet 1-2 tablet  1-2 tablet Oral Q6H PRN Gladstone Lighter, MD   2 tablet at 02/02/15 0733  . morphine 2 MG/ML injection 2 mg  2 mg Intravenous Q4H PRN Loletha Grayer, MD      . ondansetron [facility]) tablet 4 mg  4 mg Oral Q6H PRN Loletha Grayer, MD       Or  . ondansetron [facility]) injection 4 mg  4 mg Intravenous Q6H PRN Loletha Grayer, MD   4 mg at 01/30/15 1928  . pantoprazole (PROTONIX) EC tablet 40 mg  40 mg Oral BID AC Gladstone Lighter, MD   40 mg at 02/02/15 0733  . prochlorperazine (COMPAZINE) injection 10 mg  10 mg Intravenous Q6H PRN Theodoro Grist, MD      .  sodium chloride 0.9 % injection 3 mL  3 mL Intravenous Q12H Loletha Grayer, MD   3 mL at 02/02/15 0734    Vital Signs: BP 168/88 mmHg  Pulse 87  Temp(Src) 97.7 F (36.5 C) (Oral)  Resp 19  Ht 5\' 4"  (1.626 m)  Wt 124.059 kg (273 lb 8 oz)  BMI 46.92 kg/m2  SpO2 97% Filed Weights   01/30/15 1523 01/30/15 1856  Weight: 122.471  kg (270 lb) 124.059 kg (273 lb 8 oz)    Estimated body mass index is 46.92 kg/(m^2) as calculated from the following:   Height as of this encounter: 5\' 4"  (1.626 m).   Weight as of this encounter: 124.059 kg (273 lb 8 oz).  PERFORMANCE STATUS (ECOG) : 3 - Symptomatic, >50% confined to bed  Or bedside chair   PHYSICAL EXAM: Up in bedside chair fussing about the phone.   Phone line is terribly tangled and half-out of the wall connection point (I fixed this but she was still quite unhappy with the phone) EOMI OP clear Neck w/o JVD or Tm Hrt rrr no m Lungs cta no rales Abd soft and NT but morbidly obese Ext no cyanosis or mottling She is agitated     LABS: CBC:    Component Value Date/Time   WBC 11.6* 02/02/2015 0608   WBC 6.0 07/28/2013 0943   HGB 7.3* 02/02/2015 0608   HGB 8.1* 08/04/2013 1230   HCT 26.7* 02/02/2015 0608   HCT 24.4* 07/28/2013 0943   PLT 404 02/02/2015 0608   PLT 494* 07/28/2013 0943   MCV 54.1* 02/02/2015 0608   MCV 50* 07/28/2013 0943   NEUTROABS 7.4* 01/30/2015 1540   LYMPHSABS 1.3 01/30/2015 1540   MONOABS 0.7 01/30/2015 1540   EOSABS 0.1 01/30/2015 1540   BASOSABS 0.1 01/30/2015 1540   BASOSABS 1 07/26/2013 1902   Comprehensive Metabolic Panel:    Component Value Date/Time   NA 138 02/02/2015 0608   NA 134* 07/27/2013 0314   K 4.0 02/02/2015 0608   K 4.0 07/27/2013 0314   CL 107 02/02/2015 0608   CL 103 07/27/2013 0314   CO2 24 02/02/2015 0608   CO2 25 07/27/2013 0314   BUN 7 02/02/2015 0608   BUN 8 07/27/2013 0314   CREATININE 0.63 02/02/2015 0608   CREATININE 0.85 07/27/2013 0314   GLUCOSE 111*  02/02/2015 0608   GLUCOSE 117* 07/27/2013 0314   CALCIUM 8.7* 02/02/2015 0608   CALCIUM 9.0 07/27/2013 0314   AST 16 01/30/2015 1540   AST 16 07/26/2013 1902   ALT 13* 01/30/2015 1540   ALT 16 07/26/2013 1902   ALKPHOS 44 01/30/2015 1540   ALKPHOS 77 07/26/2013 1902   BILITOT 0.9 01/30/2015 1540   BILITOT 0.3 07/26/2013 1902   PROT 6.7 01/30/2015 1540   PROT 8.1 07/26/2013 1902   ALBUMIN 3.0* 01/30/2015 1540   ALBUMIN 3.7 07/26/2013 1902     More than 50% of the visit was spent in counseling/coordination of care: Yes  Time Spent:  55 minutes

## 2015-02-02 NOTE — Plan of Care (Signed)
Problem: Education: Goal: Knowledge of [facility] Education information/materials will improve Outcome: Progressing Pt is alert, disoriented to time. C/o headache, Norco given relief. Remains bedbound, incontinent of urine. Awaiting stool sample.  Frequently requests throughout the night.      Problem: Safety: Goal: Ability to remain free from injury will improve Outcome: Progressing Pt is a high fall risk, hourly rounding. Encouraged to call for assistance when needed.

## 2015-02-03 ENCOUNTER — Encounter
Admission: EM | Disposition: A | Discharge status: Skilled Nursing Facility | Payer: Self-pay | Source: Home / Self Care | Attending: Internal Medicine

## 2015-02-03 DIAGNOSIS — R410 Disorientation, unspecified: Secondary | ICD-10-CM

## 2015-02-03 DIAGNOSIS — R140 Abdominal distension (gaseous): Secondary | ICD-10-CM

## 2015-02-03 LAB — TYPE AND SCREEN
ABO/RH(D): O POS
Antibody Screen: NEGATIVE
UNIT DIVISION: 0
Unit division: 0
Unit division: 0
Unit division: 0

## 2015-02-03 LAB — OCCULT BLOOD X 1 CARD TO LAB, STOOL: FECAL OCCULT BLD: NEGATIVE

## 2015-02-03 LAB — CBC
HCT: 30.5 % — ABNORMAL LOW (ref 35.0–47.0)
Hemoglobin: 8.9 g/dL — ABNORMAL LOW (ref 12.0–16.0)
MCH: 16.8 pg — ABNORMAL LOW (ref 26.0–34.0)
MCHC: 29.1 g/dL — ABNORMAL LOW (ref 32.0–36.0)
MCV: 57.9 fL — ABNORMAL LOW (ref 80.0–100.0)
PLATELETS: 352 10*3/uL (ref 150–440)
RBC: 5.26 MIL/uL — AB (ref 3.80–5.20)
RDW: 40.7 % — ABNORMAL HIGH (ref 11.5–14.5)
WBC: 11.7 10*3/uL — AB (ref 3.6–11.0)

## 2015-02-03 LAB — BASIC METABOLIC PANEL
ANION GAP: 8 (ref 5–15)
BUN: 6 mg/dL (ref 6–20)
CO2: 23 mmol/L (ref 22–32)
Calcium: 8.4 mg/dL — ABNORMAL LOW (ref 8.9–10.3)
Chloride: 103 mmol/L (ref 101–111)
Creatinine, Ser: 0.49 mg/dL (ref 0.44–1.00)
GFR calc Af Amer: >60 mL/min (ref >60)
Glucose, Bld: 105 mg/dL — ABNORMAL HIGH (ref 65–99)
POTASSIUM: 3.6 mmol/L (ref 3.5–5.1)
SODIUM: 134 mmol/L — AB (ref 135–145)

## 2015-02-03 SURGERY — ESOPHAGOGASTRODUODENOSCOPY (EGD) WITH PROPOFOL
Anesthesia: General

## 2015-02-03 NOTE — Consult Note (Signed)
  GI Inpatient Follow-up Note  Patient Identification: [name] is a 75 y.o. female with large pelvic mass with rectal bleeding.  Subjective: Started bowel prep late last night. Unfortunately, pt only drank part of prep. As a result, stools are still still muddy and cloudy.  Scheduled Inpatient Medications:  . ferrous sulfate  325 mg Oral BID WC  . pantoprazole  40 mg Oral BID AC  . sodium chloride  3 mL Intravenous Q12H    Continuous Inpatient Infusions:     PRN Inpatient Medications:  acetaminophen **OR** acetaminophen, calcium carbonate, HYDROcodone-acetaminophen, morphine injection, ondansetron **OR** ondansetron (ZOFRAN) IV, prochlorperazine  Review of Systems: Constitutional: Weight is stable.  Eyes: No changes in vision. ENT: No oral lesions, sore throat.  GI: see HPI.  Heme/Lymph: No easy bruising.  CV: No chest pain.  GU: No hematuria.  Integumentary: No rashes.  Neuro: No headaches.  Psych: No depression/anxiety.  Endocrine: No heat/cold intolerance.  Allergic/Immunologic: No urticaria.  Resp: No cough, SOB.  Musculoskeletal: No joint swelling.    Physical Examination: BP 139/54 mmHg  Pulse 86  Temp(Src) 98.4 F (36.9 C) (Oral)  Resp 15  Ht 5\' 4"  (1.626 m)  Wt 124.059 kg (273 lb 8 oz)  BMI 46.92 kg/m2  SpO2 100% Gen: NAD, alert and oriented x 4 HEENT: PEERLA, EOMI, Neck: supple, no JVD or thyromegaly Chest: CTA bilaterally, no wheezes, crackles, or other adventitious sounds CV: RRR, no m/g/c/r Abd: soft, NT, ND, +BS in all four quadrants; no HSM, guarding, ridigity, or rebound tenderness Ext: no edema, well perfused with 2+ pulses, Skin: no rash or lesions noted Lymph: no LAD  Data: Lab Results  Component Value Date   WBC 11.7* 02/03/2015   HGB 8.9* 02/03/2015   HCT 30.5* 02/03/2015   MCV 57.9* 02/03/2015   PLT 352 02/03/2015    Recent Labs Lab 02/01/15 0538 02/02/15 0608 02/03/15 0335  HGB 7.7* 7.3* 8.9*   Lab Results   Component Value Date   NA 134* 02/03/2015   K 3.6 02/03/2015   CL 103 02/03/2015   CO2 23 02/03/2015   BUN 6 02/03/2015   CREATININE 0.49 02/03/2015   Lab Results  Component Value Date   ALT 13* 01/30/2015   AST 16 01/30/2015   ALKPHOS 44 01/30/2015   BILITOT 0.9 01/30/2015   No results for input(s): APTT, INR, PTT in the last 168 hours. Assessment/Plan: Ms. Donlin is a 75 y.o. female with rectal bleeding and anemia.   Recommendations: Will have to postpone procedures until tomorrow until pt is adequately prepped. With pelvic mass, may be difficult to prep and complete procedures. Please call with questions or concerns.  Naina Sleeper, Lupita Dawn, MD

## 2015-02-03 NOTE — Progress Notes (Signed)
Patient to be NPO after midnight for colonoscopy. Patient is refusing to drink bowel prep - encouragement and education provided. Madlyn Frankel, RN

## 2015-02-03 NOTE — Progress Notes (Signed)
Patient was assisted back to bed. No injuries noted. Supervisor and MD notified. Madlyn Frankel, RN

## 2015-02-03 NOTE — Progress Notes (Signed)
Patient found eating a cheeseburger (brought by family) - explained to the patient that she is on a full liquid diet and she should not have solid food. Patient refused and continued to finish the food. Patient is refusing to drink bowel prep at this time. Patient is uncooperative. Madlyn Frankel, RN

## 2015-02-03 NOTE — Care Management Important Message (Signed)
Important Message  Patient Details  Name: [name] MRN: [id number] Date of Birth: 1941/01/15   Medicare Important Message Given:  Yes    [name] A Dalana Pfahler 02/03/2015, 11:48 AM
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
## 2015-02-03 NOTE — Progress Notes (Signed)
[facility] [place] NAME: [name]    MR#:  [id number]  DATE OF BIRTH:  1940-08-29  SUBJECTIVE:  CHIEF COMPLAINT:   Chief Complaint  Patient presents with  . Emesis   Received 2 u prbc's on 1/4 with increase in hgb.  Was not able to complete prep overnight, so no colonoscopy today. No specific complaints. Seems generally anxious and confused.   REVIEW OF SYSTEMS:  Review of Systems  Constitutional: Positive for malaise/fatigue. Negative for fever and chills.  HENT: Negative for ear discharge, ear pain and nosebleeds.   Eyes: Negative for blurred vision.  Respiratory: Negative for cough, shortness of breath and wheezing.   Cardiovascular: Negative for chest pain, palpitations and leg swelling.  Gastrointestinal: Negative for nausea, vomiting, abdominal pain, diarrhea and constipation.  Genitourinary: Negative for dysuria.  Neurological: Negative for dizziness, seizures and headaches.  Psychiatric/Behavioral: Negative for depression.    DRUG ALLERGIES:  No Known Allergies  VITALS:  Blood pressure 139/54, pulse 86, temperature 98.4 F (36.9 C), temperature source Oral, resp. rate 15, height 5\' 4"  (1.626 m), weight 124.059 kg (273 lb 8 oz), SpO2 100 %.  PHYSICAL EXAMINATION:  Physical Exam  GENERAL:  75 y.o.-year-old obese patient lying in the bed with no acute distress.  LUNGS: Normal breath sounds bilaterally, no wheezing, rales, rhonchi or crepitation. No use of accessory muscles of respiration.  CARDIOVASCULAR: S1, S2 normal. No murmurs, rubs, or gallops.  ABDOMEN: Abdomen is distended, Soft, non-tender. Bowel sounds present. No organomegaly or mass.  EXTREMITIES: No pedal edema, cyanosis, or clubbing.  NEUROLOGIC: Cranial nerves II through XII are intact. Muscle strength 5/5 in all extremities. Sensation intact. Gait not checked.  PSYCHIATRIC: The patient is alert and oriented x 3. anxious SKIN: No obvious rash,  lesion, or ulcer.    LABORATORY PANEL:   CBC  Recent Labs Lab 02/03/15 0335  WBC 11.7*  HGB 8.9*  HCT 30.5*  PLT 352   ------------------------------------------------------------------------------------------------------------------  Chemistries   Recent Labs Lab 01/30/15 1540  02/03/15 0335  NA 127*  < > 134*  K 3.2*  < > 3.6  CL 100*  < > 103  CO2 21*  < > 23  GLUCOSE 153*  < > 105*  BUN 9  < > 6  CREATININE 0.61  < > 0.49  CALCIUM 7.9*  < > 8.4*  AST 16  --   --   ALT 13*  --   --   ALKPHOS 44  --   --   BILITOT 0.9  --   --   < > = values in this interval not displayed. ------------------------------------------------------------------------------------------------------------------  Cardiac Enzymes No results for input(s): TROPONINI in the last 168 hours. ------------------------------------------------------------------------------------------------------------------  RADIOLOGY:  No results found.  EKG:   Orders placed or performed during the hospital encounter of 01/30/15  . EKG 12-Lead  . EKG 12-Lead    ASSESSMENT AND PLAN:   75 year old Caucasian female with past medical history significant for anemia, menorrhagia comes to the hospital secondary to abdominal pain and weakness.  #1 Acute on chronic anemia- hb down to 5.3 on adm - MCV is very low- likely slow blood loss - received 4 units PRBC Tx during admission - GI consulted for EGD and colonoscopy- not possible today due to incomplete prep. Will schedule for tomorrow. Clears and continue prep today - f/u CEA - monitor hb and continue iron supplements at discharge  # 2 Left adnexal mass-likely  malignant. 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 - Appreciate oncology consult. -GI consult pending as patient will need EGD and colonoscopy as part of the workup. -GYN oncology has seen the patient, recommended surgery if GI workup is negative. This can be done as an outpatient in the next 2-3 weeks. Patient can be  discharged to rehabilitation if GI workup is done. -CEA level is pending and CA 125 level is normal  #3 GERD- protonix  #4 DVT Prophylaxis- Teds and scds  #5 Menorrhagia- gyn follow up on biopsy done in the office. - prior polyp in uterus biopsy- only hyperplasia- no malignancy at that time.  Physical therapy consulted. Will need rehabilitation at discharge. Patient can follow up with GYN as outpatient in the next couple of weeks after GI workup is done.    All the records are reviewed and case discussed with Care Management/Social Workerr. Management plans discussed with the patient, family and they are in agreement.   I have called [name] [phone] at the patient's request. Family updated and questions answered.  CODE STATUS: Full Code  TOTAL TIME TAKING CARE OF THIS PATIENT: 35 minutes.   POSSIBLE D/C IN 1-2 DAYS, DEPENDING ON CLINICAL CONDITION.   Myrtis Ser M.D on 02/03/2015 at 8:07 AM  Between 7am to 6pm - Pager - [phone]  After 6pm go to [email or web address] - password [email or web address] [facility]  [facility]  Office  [phone]  CC: Primary care physician; No PCP Per Patient

## 2015-02-03 NOTE — Progress Notes (Signed)
Patient was evaluated by GYN oncologist.  (.Dr. Georgia Dom) Lower endoscopy is pending today.  Depending on the result patient can be discharged to rehabilitation I will arrange for the follow-up appointment and intravenous iron administration Subsequent appointment for possible surgical intervention on January 25 has been tentatively planned. I will discuss the situation with patient and family.  If patient decides to go home may need to physiotherapy. He needs psychiatric evaluation
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
## 2015-02-03 NOTE — Progress Notes (Signed)
[name], NT responded to bed alarm and patient was found on the side of the bed, slowly sliding to the floor onto her knees stating that she wanted to crawl out of the door. Patient proceeded to lay on the floor and refused NTs offering of assistance. Madlyn Frankel, RN

## 2015-02-04 ENCOUNTER — Encounter
Admission: EM | Disposition: A | Discharge status: Skilled Nursing Facility | Payer: Self-pay | Source: Home / Self Care | Attending: Internal Medicine

## 2015-02-04 ENCOUNTER — Other Ambulatory Visit: Payer: Self-pay | Admitting: *Deleted

## 2015-02-04 ENCOUNTER — Encounter: Payer: Self-pay | Admitting: Registered Nurse

## 2015-02-04 DIAGNOSIS — N949 Unspecified condition associated with female genital organs and menstrual cycle: Secondary | ICD-10-CM | POA: Diagnosis not present

## 2015-02-04 DIAGNOSIS — J90 Pleural effusion, not elsewhere classified: Secondary | ICD-10-CM | POA: Diagnosis not present

## 2015-02-04 DIAGNOSIS — D509 Iron deficiency anemia, unspecified: Secondary | ICD-10-CM | POA: Diagnosis not present

## 2015-02-04 DIAGNOSIS — D649 Anemia, unspecified: Secondary | ICD-10-CM | POA: Diagnosis not present

## 2015-02-04 DIAGNOSIS — R1904 Left lower quadrant abdominal swelling, mass and lump: Secondary | ICD-10-CM | POA: Diagnosis present

## 2015-02-04 DIAGNOSIS — R188 Other ascites: Secondary | ICD-10-CM | POA: Diagnosis not present

## 2015-02-04 DIAGNOSIS — F0390 Unspecified dementia without behavioral disturbance: Secondary | ICD-10-CM

## 2015-02-04 DIAGNOSIS — M5136 Other intervertebral disc degeneration, lumbar region: Secondary | ICD-10-CM | POA: Diagnosis not present

## 2015-02-04 DIAGNOSIS — M6281 Muscle weakness (generalized): Secondary | ICD-10-CM | POA: Diagnosis not present

## 2015-02-04 DIAGNOSIS — N939 Abnormal uterine and vaginal bleeding, unspecified: Secondary | ICD-10-CM | POA: Diagnosis not present

## 2015-02-04 DIAGNOSIS — Z5189 Encounter for other specified aftercare: Secondary | ICD-10-CM | POA: Diagnosis not present

## 2015-02-04 DIAGNOSIS — R41841 Cognitive communication deficit: Secondary | ICD-10-CM | POA: Diagnosis not present

## 2015-02-04 DIAGNOSIS — Z79899 Other long term (current) drug therapy: Secondary | ICD-10-CM | POA: Diagnosis not present

## 2015-02-04 DIAGNOSIS — E871 Hypo-osmolality and hyponatremia: Secondary | ICD-10-CM | POA: Diagnosis not present

## 2015-02-04 DIAGNOSIS — R140 Abdominal distension (gaseous): Secondary | ICD-10-CM | POA: Diagnosis not present

## 2015-02-04 DIAGNOSIS — R1900 Intra-abdominal and pelvic swelling, mass and lump, unspecified site: Secondary | ICD-10-CM | POA: Diagnosis not present

## 2015-02-04 DIAGNOSIS — R6889 Other general symptoms and signs: Secondary | ICD-10-CM | POA: Diagnosis not present

## 2015-02-04 DIAGNOSIS — R2681 Unsteadiness on feet: Secondary | ICD-10-CM | POA: Diagnosis not present

## 2015-02-04 DIAGNOSIS — K625 Hemorrhage of anus and rectum: Secondary | ICD-10-CM | POA: Diagnosis not present

## 2015-02-04 LAB — BASIC METABOLIC PANEL
ANION GAP: 6 (ref 5–15)
BUN: 5 mg/dL — ABNORMAL LOW (ref 6–20)
CALCIUM: 8.2 mg/dL — AB (ref 8.9–10.3)
CHLORIDE: 106 mmol/L (ref 101–111)
CO2: 23 mmol/L (ref 22–32)
Creatinine, Ser: 0.49 mg/dL (ref 0.44–1.00)
GFR calc non Af Amer: >60 mL/min (ref >60)
GLUCOSE: 91 mg/dL (ref 65–99)
POTASSIUM: 4.4 mmol/L (ref 3.5–5.1)
Sodium: 135 mmol/L (ref 135–145)

## 2015-02-04 LAB — CBC
HEMATOCRIT: 30.4 % — AB (ref 35.0–47.0)
HEMOGLOBIN: 8.6 g/dL — AB (ref 12.0–16.0)
MCH: 16.4 pg — ABNORMAL LOW (ref 26.0–34.0)
MCHC: 28.4 g/dL — ABNORMAL LOW (ref 32.0–36.0)
MCV: 57.8 fL — AB (ref 80.0–100.0)
Platelets: 323 10*3/uL (ref 150–440)
RBC: 5.26 MIL/uL — AB (ref 3.80–5.20)
RDW: 41.2 % — AB (ref 11.5–14.5)
WBC: 11.1 10*3/uL — AB (ref 3.6–11.0)

## 2015-02-04 SURGERY — COLONOSCOPY
Anesthesia: General

## 2015-02-04 SURGERY — EGD (ESOPHAGOGASTRODUODENOSCOPY)
Anesthesia: Monitor Anesthesia Care | Laterality: Left

## 2015-02-04 MED ORDER — PANTOPRAZOLE SODIUM 40 MG PO TBEC: 40.0000 mg | DELAYED_RELEASE_TABLET | Freq: Every day | ORAL | Status: DC

## 2015-02-04 MED ORDER — CALCIUM CARBONATE ANTACID 500 MG PO CHEW: 1.0000 | CHEWABLE_TABLET | Freq: Four times a day (QID) | ORAL | Status: DC | PRN

## 2015-02-04 NOTE — Plan of Care (Signed)
Problem: Physical Regulation: Goal: Will remain free from infection Outcome: Progressing Pt has reported pain x2 during shift. Pt has called out multiple times during shift. No other signs of distress noted. Will continue to monitor.

## 2015-02-04 NOTE — Progress Notes (Signed)
Pt being discharged to SNF, report called to [place] at [facility], EMS called for transport, palliative in to see pt

## 2015-02-04 NOTE — Progress Notes (Signed)
Pt being discharged via ems at this time, no noted complaints at discharge

## 2015-02-04 NOTE — Clinical Social Work Placement (Signed)
   CLINICAL SOCIAL WORK PLACEMENT  NOTE  Date:  02/04/2015  Patient Details  Name: [name] MRN: [id number] Date of Birth: [birth year]  Clinical Social Work is seeking post-discharge placement for this patient at the [place] level of care (*CSW will initial, date and re-position this form in  chart as items are completed):  Yes   Patient/family provided with [place] Work Department's list of facilities offering this level of care within the geographic area requested by the patient (or if unable, by the patient's family).  Yes   Patient/family informed of their freedom to choose among providers that offer the needed level of care, that participate in Medicare, Medicaid or managed care program needed by the patient, have an available bed and are willing to accept the patient.  Yes   Patient/family informed of [facility]'s ownership interest in [facility] and [facility], as well as of the fact that they are under no obligation to receive care at these facilities.  PASRR submitted to EDS on 02/02/15     PASRR number received on 02/02/15     Existing PASRR number confirmed on       FL2 transmitted to all facilities in geographic area requested by pt/family on 02/02/15     FL2 transmitted to all facilities within larger geographic area on       Patient informed that his/her managed care company has contracts with or will negotiate with certain facilities, including the following:        Yes   Patient/family informed of bed offers received.  Patient chooses bed at [facility]     Physician recommends and patient chooses bed at  [facility])    Patient to be transferred to [facility] [place] on 02/04/15.  Patient to be transferred to facility by [facility]     Patient family notified on 02/04/15 of transfer.  Name of family member notified:  Pt's daughter in law, [facility]     PHYSICIAN        Additional Comment:    _______________________________________________ Darden Dates, LCSW 02/04/2015, 10:59 AM

## 2015-02-04 NOTE — Discharge Summary (Signed)
[facility] [place] NAME: [name]    MR#:  [id number]  DATE OF BIRTH:  12/28/1940  DATE OF ADMISSION:  01/30/2015 ADMITTING PHYSICIAN: Loletha Grayer, MD  DATE OF DISCHARGE: 02/04/2015  PRIMARY CARE PHYSICIAN: No PCP Per Patient    ADMISSION DIAGNOSIS:  Abdominal distension [R14.0] Vomiting [R11.10] Anemia, unspecified anemia type [D64.9] Nausea and vomiting, vomiting of unspecified type [R11.2]  DISCHARGE DIAGNOSIS:  Active Problems:   Anemia   Pelvic mass in female   Elevated CA-125   Postmenopausal bleeding   Morbid obesity ([place])   SECONDARY DIAGNOSIS:   Past Medical History  Diagnosis Date  . Anemia     HOSPITAL COURSE:    75 year old Caucasian female with past medical history significant for anemia, menorrhagia comes to the hospital secondary to abdominal pain and weakness.  #1 Acute on chronic anemia- hb down to 5.3 on admission.  - MCV is very low- likely slow blood loss - received 4 units PRBC Tx during admission - GI consulted for EGD and colonoscopy- this was not possible as patient continued to refuse prep. Continued efforts at prep can be done at skilled nursing facility and these procedures can be performed as an outpatient. - will need to monitor hb weekly and continue iron supplements at discharge  # 2 Left adnexal mass-likely malignant. 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 - Appreciate oncology consult. -GI consult pending as patient will need EGD and colonoscopy as part of the workup. -GYN oncology has seen the patient, recommended surgery if GI workup is negative. This can be done as an outpatient in the next 2-3 weeks. Patient can be discharged to rehabilitation with paln in place for GI work up to be completed. -CEA level is normal and CA 125 level is elevated  #3 GERD- protonix  #4 DVT Prophylaxis- Teds and scds  #5 Menorrhagia- gyn follow up on biopsy done in the office. -  prior polyp in uterus biopsy- only hyperplasia- no malignancy at that time.  #6 goals of care: The patient is alert, and oriented but does not grasp her medical situation.  We have been unable to discuss goals of care with her. She is separated from her husband but not divorced. Her daughter in law Rolla Etienne [phone]  Participates in her care. The family will work towards a POA.   DISCHARGE CONDITIONS:   stable  CONSULTS OBTAINED:  Treatment Team:  Loletha Grayer, MD Lloyd Huger, MD Forest Gleason, MD Boykin Nearing, MD Hulen Luster, MD  DRUG ALLERGIES:  No Known Allergies  DISCHARGE MEDICATIONS:   Current Discharge Medication List    START taking these medications   Details  calcium carbonate (TUMS - DOSED IN MG ELEMENTAL CALCIUM) 500 MG chewable tablet Chew 1 tablet (200 mg of elemental calcium total) by mouth every 6 (six) hours as needed for indigestion or heartburn. Qty: 60 tablet, Refills: 0    pantoprazole (PROTONIX) 40 MG tablet Take 1 tablet (40 mg total) by mouth daily. Qty: 30 tablet, Refills: 0      CONTINUE these medications which have NOT CHANGED   Details  acetaminophen (TYLENOL) 325 MG tablet Take 650 mg by mouth every 6 (six) hours as needed for moderate pain.    Bioflavonoid Products (GRAPE SEED PO) Take 1 capsule by mouth daily.    ferrous sulfate 325 (65 FE) MG tablet Take 325 mg by mouth 2 (two) times daily with a meal.  Misc Natural Products (TURMERIC CURCUMIN) CAPS Take 1 capsule by mouth daily.      STOP taking these medications     ibuprofen (ADVIL,MOTRIN) 200 MG tablet      diphenhydrAMINE (BENADRYL) 25 MG tablet      ibuprofen (ADVIL,MOTRIN) 400 MG tablet          DISCHARGE INSTRUCTIONS:    Regular diet. DC to skilled nursing home. Activity per PT recommendations. Check CBC abd BMP q week.   If you experience worsening of your admission symptoms, develop shortness of breath, life threatening emergency,  suicidal or homicidal thoughts you must seek medical attention immediately by calling 911 or calling your MD immediately  if symptoms less severe.  You Must read complete instructions/literature along with all the possible adverse reactions/side effects for all the Medicines you take and that have been prescribed to you. Take any new Medicines after you have completely understood and accept all the possible adverse reactions/side effects.   Please note  You were cared for by a hospitalist during your hospital stay. If you have any questions about your discharge medications or the care you received while you were in the hospital after you are discharged, you can call the unit and asked to speak with the hospitalist on call if the hospitalist that took care of you is not available. Once you are discharged, your primary care physician will handle any further medical issues. Please note that NO REFILLS for any discharge medications will be authorized once you are discharged, as it is imperative that you return to your primary care physician (or establish a relationship with a primary care physician if you do not have one) for your aftercare needs so that they can reassess your need for medications and monitor your lab values.    Today   CHIEF COMPLAINT:   Chief Complaint  Patient presents with  . Emesis    HISTORY OF PRESENT ILLNESS:  [name] is a 75 y.o. female with a known history of anemia. She presents to the ER with vomiting for 1 day. Increased abdominal distention. Not real abdominal pain. Of note, 6 months ago she was found to have an abdominal mass and was told to follow up. She followed up with Dr. Ouida Sills gynecology revised hysterectomy and she refused. She followed up with Dr. Eliberto Ivory the urology and she refused cystoscopy. Also refused colonoscopy at that time. In the ER, her hemoglobin is 5.3 and she is hesitant on transfusion. The ER physician ordered 2 units of blood.  Patient eats ice at home. VITAL SIGNS:  Blood pressure 165/75, pulse 87, temperature 97.8 F (36.6 C), temperature source Oral, resp. rate 17, height 5\' 4"  (1.626 m), weight 124.059 kg (273 lb 8 oz), SpO2 97 %.  I/O:  No intake or output data in the 24 hours ending 02/04/15 0828  PHYSICAL EXAMINATION:  GENERAL:  75 y.o.-year-old patient lying in the bed with no acute distress.  EYES: Pupils equal, round, reactive to light and accommodation. No scleral icterus. Extraocular muscles intact.  HEENT: Head atraumatic, normocephalic. Oropharynx and nasopharynx clear.  NECK:  Supple, no jugular venous distention. No thyroid enlargement, no tenderness.  LUNGS: Normal breath sounds bilaterally, no wheezing, rales,rhonchi or crepitation. No use of accessory muscles of respiration.  CARDIOVASCULAR: S1, S2 normal. No murmurs, rubs, or gallops.  ABDOMEN: distended, firm, non tender, bowel sounds present, no guarding or rebond EXTREMITIES: No pedal edema, cyanosis, or clubbing. Pulses +1 NEUROLOGIC: Cranial nerves II through XII are  intact. Muscle strength 5/5 in all extremities. Sensation intact. Gait not checked.  PSYCHIATRIC: The patient is alert and oriented x 3. anxious SKIN: No obvious rash, lesion, or ulcer.   DATA REVIEW:   CBC  Recent Labs Lab 02/04/15 0628  WBC 11.1*  HGB 8.6*  HCT 30.4*  PLT 323    Chemistries   Recent Labs Lab 01/30/15 1540  02/04/15 0628  NA 127*  < > 135  K 3.2*  < > 4.4  CL 100*  < > 106  CO2 21*  < > 23  GLUCOSE 153*  < > 91  BUN 9  < > 5*  CREATININE 0.61  < > 0.49  CALCIUM 7.9*  < > 8.2*  AST 16  --   --   ALT 13*  --   --   ALKPHOS 44  --   --   BILITOT 0.9  --   --   < > = values in this interval not displayed.  Cardiac Enzymes No results for input(s): TROPONINI in the last 168 hours.  Microbiology Results  Results for orders placed or performed in visit on 07/26/13  Urine culture     Status: None   Collection Time: 07/26/13  8:55 PM   Result Value Ref Range Status   Micro Text Report   Final       SOURCE: CLEAN CATCH    COMMENT                   MIXED BACTERIAL ORGANISMS   COMMENT                   RESULTS SUGGESTIVE OF CONTAMINATION   ANTIBIOTIC                                                        RADIOLOGY:  No results found.  EKG:   Orders placed or performed during the hospital encounter of 01/30/15  . EKG 12-Lead  . EKG 12-Lead      Management plans discussed with the patient, family and they are in agreement.  CODE STATUS:     Code Status Orders        Start     Ordered   01/30/15 1733  Full code   Continuous     01/30/15 1733      TOTAL TIME TAKING CARE OF THIS PATIENT: 50 minutes.  Greater than 50% of time spent in care coordination and counseling. Care plan discussed with patient, her daughter in law Rolla Etienne [id number]   Myrtis Ser M.D on 02/04/2015 at 8:28 AM  Between 7am to 6pm - Pager - [phone]  After 6pm go to [email or web address] - password [email or web address] [facility]  [facility]  Office  [phone]  CC: Primary care physician; No PCP Per Patient

## 2015-02-04 NOTE — Progress Notes (Signed)
Pt refused prep for colonoscopy Dr Ohs nurse made aware, pt states that she is ready to go, pt to be discharged to SNF today

## 2015-02-04 NOTE — Progress Notes (Signed)
Palliative Medicine Inpatient Consult Follow Up Note   Name: Karl Staunton Date: 02/04/2015 MRN: [id number]  DOB: 25-Feb-1940  Referring Physician: Aldean Jewett, MD  Palliative Care consult requested for this 76 y.o. female for goals of medical therapy in patient with severe anemia.  TODAY'S DISCUSSIONS AND DECISIONS:  She apparently has a dementia and this would explain her noncompliance.  She is full code b/c she lacks capacity to appreciate this subject matter due to a dementia.  She would benefit from regular mini-mental status exams and other assessments for dementia once she is in the facility.    She would also benefit from a Palliative Care Consult at the facility, since it appears she may have an occult cancer that can't at this time be fully evaluated due to her inability to take the prep needed for a colonoscopy to be performed.    She should have a decision maker in the family who can address which tests are done or not done and also code status.  Family should be encouraged to go with pt to any and all outpt appointments with MDs etc.    She is going to [place] DNR NOW.    IMPRESSION: Severe symptomatic anemia ---on iron  ---presumably related to either chronic GI, Uterine, or urinary tract blood loss.  -- Pt to get endoscopy tomorrow.  Enlarging left adnexal mass with abdominal distension --with solid component 'worrisome for ovarian cancer' --will need outpt work up for this if she desires now to follow through --had refused workup/ treatment 6 mos ago when mass was smaller Nausea and vomiting Edema Possible rectal bleeding Hyponatremia Impaired fasting glucose Miorbid Obesity PICA (ice eating) Anxiety Medical noncompliance DUE TO DEMENTIA PT HAS AN EARLY DEMENTIA  --sometimes she can asnwer orientation questions but still be quite confused otherwise and she also exhibits poor executive function and judgement abilities.   She will NEED TO BE FOLLOWED FOR HER DEMENTIA over time.    ---------------------------------------------------------------------------------------------------------- CODE STATUS: PATIENT WAS ABLE TO TALK ABOUT THIS RATIONALLY TODAY --WITH HELP OF DAUGHTER-IN-LAW BEING HERE (ALSO PT MAY BE ONE WHO SUNDOWNS AND IS LESS ABLE TO TALK RATIONALLY IN THE EVENINGS).  SHE OPTED TO BE DNR  THIS WAS DONE AFTER DC SUMMARY --BUT PT IS DNR NOW AS OF 11.48 AM ON THIS DATE.  FORM COMPLETED.   ----------------------------------------------------------------------------------------------------------     PAST MEDICAL HISTORY: Past Medical History  Diagnosis Date  . Anemia     PAST SURGICAL HISTORY:  Past Surgical History  Procedure Laterality Date  . None      Vital Signs: BP 165/75 mmHg  Pulse 87  Temp(Src) 97.8 F (36.6 C) (Oral)  Resp 17  Ht 5\' 4"  (1.626 m)  Wt 124.059 kg (273 lb 8 oz)  BMI 46.92 kg/m2  SpO2 97% Filed Weights   01/30/15 1523 01/30/15 1856  Weight: 122.471 kg (270 lb) 124.059 kg (273 lb 8 oz)    Estimated body mass index is 46.92 kg/(m^2) as calculated from the following:   Height as of this encounter: 5\' 4"  (1.626 m).   Weight as of this encounter: 124.059 kg (273 lb 8 oz).  PHYSICAL EXAM: Awake alert and pleasant Hrt rrr no mgr Lungs cta Abd soft and NT Ext no cyanosis or mottling Skin warm and dry Obese       LABS: CBC:    Component Value Date/Time   WBC 11.1* 02/04/2015 0628   WBC 6.0 07/28/2013 0943  HGB 8.6* 02/04/2015 0628   HGB 8.1* 08/04/2013 1230   HCT 30.4* 02/04/2015 0628   HCT 24.4* 07/28/2013 0943   PLT 323 02/04/2015 0628   PLT 494* 07/28/2013 0943   MCV 57.8* 02/04/2015 0628   MCV 50* 07/28/2013 0943   NEUTROABS 7.4* 01/30/2015 1540   LYMPHSABS 1.3 01/30/2015 1540   MONOABS 0.7 01/30/2015 1540   EOSABS 0.1 01/30/2015 1540   BASOSABS 0.1 01/30/2015 1540   BASOSABS 1 07/26/2013 1902   Comprehensive Metabolic Panel:     Component Value Date/Time   NA 135 02/04/2015 0628   NA 134* 07/27/2013 0314   K 4.4 02/04/2015 0628   K 4.0 07/27/2013 0314   CL 106 02/04/2015 0628   CL 103 07/27/2013 0314   CO2 23 02/04/2015 0628   CO2 25 07/27/2013 0314   BUN 5* 02/04/2015 0628   BUN 8 07/27/2013 0314   CREATININE 0.49 02/04/2015 0628   CREATININE 0.85 07/27/2013 0314   GLUCOSE 91 02/04/2015 0628   GLUCOSE 117* 07/27/2013 0314   CALCIUM 8.2* 02/04/2015 0628   CALCIUM 9.0 07/27/2013 0314   AST 16 01/30/2015 1540   AST 16 07/26/2013 1902   ALT 13* 01/30/2015 1540   ALT 16 07/26/2013 1902   ALKPHOS 44 01/30/2015 1540   ALKPHOS 77 07/26/2013 1902   BILITOT 0.9 01/30/2015 1540   BILITOT 0.3 07/26/2013 1902   PROT 6.7 01/30/2015 1540   PROT 8.1 07/26/2013 1902   ALBUMIN 3.0* 01/30/2015 1540   ALBUMIN 3.7 07/26/2013 1902    More than 50% of the visit was spent in counseling/coordination of care: YES  Time Spent: 25 min

## 2015-02-04 NOTE — Clinical Social Work Note (Signed)
Pt is ready for discharge today to [facility]. Facility has received discharge information and is ready to admit pt. Pt and daughter in law are aware of transfer. Rn to call report and EMS will provide transportation. CSW is signing off as no further needs identified.   [name], MSW, LCSW Clinical Social Worker  [phone]

## 2015-02-09 ENCOUNTER — Inpatient Hospital Stay: Payer: Medicare Other | Admitting: Oncology

## 2015-02-09 ENCOUNTER — Inpatient Hospital Stay: Payer: Medicare Other | Attending: Oncology

## 2015-02-09 ENCOUNTER — Ambulatory Visit: Payer: Medicare Other

## 2015-02-09 ENCOUNTER — Encounter: Payer: Self-pay | Admitting: *Deleted

## 2015-02-09 DIAGNOSIS — J90 Pleural effusion, not elsewhere classified: Secondary | ICD-10-CM | POA: Insufficient documentation

## 2015-02-09 DIAGNOSIS — K625 Hemorrhage of anus and rectum: Secondary | ICD-10-CM | POA: Insufficient documentation

## 2015-02-09 DIAGNOSIS — D509 Iron deficiency anemia, unspecified: Secondary | ICD-10-CM | POA: Insufficient documentation

## 2015-02-09 DIAGNOSIS — Z79899 Other long term (current) drug therapy: Secondary | ICD-10-CM | POA: Insufficient documentation

## 2015-02-09 DIAGNOSIS — R188 Other ascites: Secondary | ICD-10-CM | POA: Insufficient documentation

## 2015-02-09 DIAGNOSIS — M5136 Other intervertebral disc degeneration, lumbar region: Secondary | ICD-10-CM | POA: Insufficient documentation

## 2015-02-09 DIAGNOSIS — R1904 Left lower quadrant abdominal swelling, mass and lump: Secondary | ICD-10-CM | POA: Insufficient documentation

## 2015-02-23 ENCOUNTER — Inpatient Hospital Stay (HOSPITAL_BASED_OUTPATIENT_CLINIC_OR_DEPARTMENT_OTHER): Payer: Medicare Other | Admitting: Obstetrics and Gynecology

## 2015-02-23 ENCOUNTER — Encounter: Payer: Self-pay | Admitting: Obstetrics and Gynecology

## 2015-02-23 ENCOUNTER — Inpatient Hospital Stay: Payer: Medicare Other

## 2015-02-23 ENCOUNTER — Other Ambulatory Visit: Payer: Self-pay | Admitting: *Deleted

## 2015-02-23 VITALS — BP 177/93 | HR 92 | Temp 98.0°F

## 2015-02-23 DIAGNOSIS — R971 Elevated cancer antigen 125 [CA 125]: Secondary | ICD-10-CM

## 2015-02-23 DIAGNOSIS — D509 Iron deficiency anemia, unspecified: Secondary | ICD-10-CM

## 2015-02-23 DIAGNOSIS — R030 Elevated blood-pressure reading, without diagnosis of hypertension: Secondary | ICD-10-CM

## 2015-02-23 DIAGNOSIS — D3912 Neoplasm of uncertain behavior of left ovary: Secondary | ICD-10-CM

## 2015-02-23 DIAGNOSIS — R1900 Intra-abdominal and pelvic swelling, mass and lump, unspecified site: Secondary | ICD-10-CM

## 2015-02-23 DIAGNOSIS — N950 Postmenopausal bleeding: Secondary | ICD-10-CM

## 2015-02-23 DIAGNOSIS — D649 Anemia, unspecified: Secondary | ICD-10-CM

## 2015-02-23 DIAGNOSIS — R1904 Left lower quadrant abdominal swelling, mass and lump: Secondary | ICD-10-CM | POA: Diagnosis not present

## 2015-02-23 LAB — CBC WITH DIFFERENTIAL/PLATELET
BAND NEUTROPHILS: 1 %
BASOS PCT: 0 %
Basophils Absolute: 0 10*3/uL (ref 0–0.1)
EOS PCT: 8 %
Eosinophils Absolute: 0.5 10*3/uL (ref 0–0.7)
HCT: 34.8 % — ABNORMAL LOW (ref 35.0–47.0)
Hemoglobin: 10.6 g/dL — ABNORMAL LOW (ref 12.0–16.0)
Lymphocytes Relative: 12 %
Lymphs Abs: 0.8 10*3/uL — ABNORMAL LOW (ref 1.0–3.6)
MCH: 19.4 pg — AB (ref 26.0–34.0)
MCHC: 30.6 g/dL — AB (ref 32.0–36.0)
MCV: 63.4 fL — ABNORMAL LOW (ref 80.0–100.0)
MONOS PCT: 9 %
Monocytes Absolute: 0.6 10*3/uL (ref 0.2–0.9)
NEUTROS ABS: 4.4 10*3/uL (ref 1.4–6.5)
Neutrophils Relative %: 70 %
PLATELETS: 573 10*3/uL — AB (ref 150–440)
RBC: 5.49 MIL/uL — ABNORMAL HIGH (ref 3.80–5.20)
SMEAR REVIEW: ADEQUATE
WBC: 6.3 10*3/uL (ref 3.6–11.0)

## 2015-02-23 LAB — IRON AND TIBC
IRON: 23 ug/dL — AB (ref 28–170)
Saturation Ratios: 5 % — ABNORMAL LOW (ref 10.4–31.8)
TIBC: 467 ug/dL — ABNORMAL HIGH (ref 250–450)
UIBC: 445 ug/dL

## 2015-02-23 LAB — FERRITIN: FERRITIN: 21 ng/mL (ref 11–307)

## 2015-02-23 NOTE — Progress Notes (Signed)
Gynecologic Oncology Consult Visit   Referring Provider: Dr. Ouida Sills  Chief Concern: Pelvic mass, elevated CA125, postmenopausal bleeding  Subjective:  [name] is a 75 y.o. female  P4 seen in consultation from Dr. Ouida Sills for for pelvic mass with postmenopausal bleeding/anemia and elevated CA125.  Patient has continued light spotting.  She was supposed to have colonoscopy after discharge, but did not and this is now scheduled for 2/13.  She has been in a SNF since discharge due to immobility.  History:  She was admitted to [facility] in 7/15 with PMB, iron deficiency anemia and an 8 cm left pelvic mass. CA125 was 10. PAP 08/21/14 AGUS. Dr Ouida Sills did an endometrial bx 08/25/14 that showed polyp with simple hyperplasia and disordered proliferative endometrium. She refused surgery offered by Dr Ouida Sills and Dr Jacquelyne Balint and was lost to follow up.   Amitted again 01/30/15 after presenting to the emergency department because of nausea and vomiting and possibly melena. Hemoglobin was 5.3 with MCV 46.9, Hct 20.7, Plts 454k, WBC 9,600. Hgb 7.8 after 2 units of blood. She has some urinary incontinence and wears a diaper. Walks with a walker and lives with her son and daughter in [place]. She does not do much.  CT scan 1/17 showed that the heterogeneous pelvic mass has grown to 17 cm. CT also shows some ascites, but no carcinomatosis. CA125 now 112 and CEA normal. Dr Ouida Sills examined the patient in his office 1/3 and endometrial biopsy and PAP were negative.02/05/15 she received an additional blood transfusion.  Colonoscopy was supposed to be done as an outpatient, but was not.    States she had an IUD, but not sure if it was removed.   Problem List: Patient Active Problem List   Diagnosis Date Noted  . Pelvic mass in female 02/02/2015  . Elevated CA-125 02/02/2015  . Postmenopausal bleeding 02/02/2015  . Morbid obesity ([place]) 02/02/2015  . Anemia  01/30/2015    Past Medical History: Past Medical History  Diagnosis Date  . Anemia     Past Surgical History: Past Surgical History  Procedure Laterality Date  . None     Family History: No family history on file.  Social History: Social History   Social History  . Marital Status: Legally Separated    Spouse Name: N/A  . Number of Children: N/A  . Years of Education: N/A   Occupational History  . Not on file.   Social History Main Topics  . Smoking status: Never Smoker   . Smokeless tobacco: Not on file  . Alcohol Use: No  . Drug Use: No  . Sexual Activity: Not on file   Other Topics Concern  . Not on file   Social History Narrative    Allergies: No Known Allergies  Current Medications: Current Outpatient Prescriptions  Medication Sig Dispense Refill  . acetaminophen (TYLENOL) 325 MG tablet Take 650 mg by mouth every 6 (six) hours as needed for moderate pain.    Marland Kitchen Bioflavonoid Products (GRAPE SEED PO) Take 1 capsule by mouth daily.    . calcium carbonate (TUMS - DOSED IN MG ELEMENTAL CALCIUM) 500 MG chewable tablet Chew 1 tablet (200 mg of elemental calcium total) by mouth every 6 (six) hours as needed for indigestion or heartburn. 60 tablet 0  . ferrous sulfate 325 (65 FE) MG tablet Take 325 mg by mouth 2 (two) times daily with a meal.    . Misc Natural Products (TURMERIC CURCUMIN) CAPS Take 1 capsule by mouth  daily.    . pantoprazole (PROTONIX) 40 MG tablet Take 1 tablet (40 mg total) by mouth daily. 30 tablet 0   No current facility-administered medications for this visit.    Review of Systems General: negative for, fevers, chills, changes in sleep, changes in weight or appetite Skin: negative for changes in color, texture, moles or lesions Eyes: negative for, changes in vision, pain, diplopia HEENT: negative for, change in hearing, pain, discharge, tinnitus, vertigo, voice changes, sore throat, neck masses Breasts: negative for breast  lumps Pulmonary: negative for, dyspnea, orthopnea, productive cough Cardiac: negative for, palpitations, syncope, pain, discomfort, pressure Gastrointestinal: negative for, dysphagia, nausea, vomiting, jaundice, pain, constipation, diarrhea, hematemesis, hematochezia Genitourinary/Sexual: negative for, dysuria, discharge, hesitancy, nocturia, retention, stones, infections, STD's, incontinence Ob/Gyn: negative for, pain Musculoskeletal: negative for, pain, stiffness, swelling, range of motion limitation Hematology: negative for, easy bruising, bleeding Neurologic/Psych: negative for, headaches, seizures, paralysis, weakness, tremor, change in gait, change in sensation, mood swings, depression, anxiety, change in memory  Objective:  Physical Examination:  BP 177/93 mmHg  Pulse 92  Temp(Src) 98 F (36.7 C) (Oral)   ECOG Performance Status: 2 - Symptomatic, <50% confined to bed  General appearance: alert, cooperative and appears stated age HEENT:PERRLA, extra ocular movement intact, neck supple with midline trachea and thyroid without masses Lymph node survey: non-palpable, axillary, inguinal, supraclavicular Cardiovascular: regular rate and rhythm, no murmurs or gallops Respiratory: normal air entry, lungs clear to auscultation and no rales, rhonchi or wheezing Breast exam: not examined. Abdomen: protuberant, without guarding, no hernias and well healed incision Back: inspection of back is normal Extremities: 1+ edema bilaterally Skin exam - normal coloration and turgor, no rashes, no suspicious skin lesions noted. Neurological exam reveals alert, oriented, normal speech, no focal findings or movement disorder noted.  Pelvic: exam chaperoned by nurse;  Vulva: normal appearing vulva with no masses, tenderness or lesions; Vagina: normal vagina; Adnexa: no masses palpable, but limited exam due to obesity; Uterus: nontender, not palpable; Cervix: anteverted; Rectal: not indicated and normal  rectal, no masses  Radiologic Imaging: 01/30/15 CT ABDOMEN AND PELVIS WITH CONTRAST  There is extensive stranding throughout the patient's fat, including intraperitoneal and subcutaneous fat. Small bilateral pleural effusions and bibasilar atelectasis.  There is a moderate amount of ascites in the abdomen. No obvious evidence of omental caking. Liver is grossly within normal limits. The lesion in the spleen previously visualized is obscured. Kidneys, adrenal glands, and pancreas are grossly within normal limits  The left adnexal mass has markedly enlarged, now measuring 16.9 cm and previously measuring 8 cm. It is heterogeneous with solid common calcified, and and fluid elements.  The metal object within the uterus is again noted and is stable. Bladder is unremarkable.   IMPRESSION: Left adnexal mass has markedly enlarged worrisome for malignancy. It is associated with ascites. Peritoneal spread of tumor cannot be excluded. There is no obvious omental caking. There are small pleural effusions and diffuse adipose edema suggesting anasarca.           Lab Results  Component Value Date   WBC 6.3 02/23/2015   HGB 10.6* 02/23/2015   HCT 34.8* 02/23/2015   MCV 63.4* 02/23/2015   PLT 573* 02/23/2015      Assessment:  [name] is a 75 y.o. female diagnosed with large pelvic mass and elevated CA125 with postmenopausal bleeding and severe iron deficiency anemia. The ovarian mass is heterogeneous and concerning for malignancy. The PMB is also concerning for uterine cancer or other  pathology even though PAP and endometrial biopsy are negative. Endometrial biopsy in 7/15 showed polyp with simple hyperplasia.  Medical comorbidties: morbid obesity with relative immobility and living in a SNF since hospital discharge, hypertension. Noncompliant with medical advice in the past.   Plan:   Problem List Items Addressed This Visit      Other   Pelvic mass in female -  Primary   Elevated CA-125   Postmenopausal bleeding     We discussed that in view of the large ovarian mass and uterine bleeding with increased CA125, she should have surgery to remove the uterus, tubes and ovaries. This will likely require a laparotomy.  Possibility of surgical staging and debulking also discussed. Alternatively, endometrial ablation is a non-surgical option, but she wants surgery. The ovarian mass has grown, but there is no evidence of metastatic disease, although CA125 is now elevated, which it was not before.   In view of her size, immobility and abdominal wall edema/anasarca, the risk of surgical complications is significant.  She does not have an internist and her BP is significantly elevated today.  We will have her seen by Dr Einar Pheasant in internal medicine for initiation of antihypertensive therapy and general physical exam in preparation for surgery.    She needs colonoscopy and upper endoscopy in view of iron deficiency anemia, and concern based on history that she may be having rectal bleeding in addition to vaginal bleeding.  Colonoscopy is scheduled for 2/13.  Tthe patient wants to have surgery at [facility] and this is reasonable in view of her medical issues. We will make definitive plans after the endoscopy and medical evaluation.   The patient's diagnosis, an outline of the further diagnostic and laboratory studies which will be required, the recommendation, and alternatives were discussed. All questions were answered to the patient's satisfaction.    Mellody Drown, MD  Dr. Ouida Sills

## 2015-02-23 NOTE — Progress Notes (Signed)
Assisted MD with pelvic exam

## 2015-03-01 ENCOUNTER — Inpatient Hospital Stay (HOSPITAL_BASED_OUTPATIENT_CLINIC_OR_DEPARTMENT_OTHER): Payer: Medicare Other | Admitting: Oncology

## 2015-03-01 ENCOUNTER — Encounter: Payer: Self-pay | Admitting: Oncology

## 2015-03-01 ENCOUNTER — Inpatient Hospital Stay: Payer: Medicare Other

## 2015-03-01 VITALS — BP 196/94 | HR 98 | Temp 96.9°F | Resp 18 | Wt 237.1 lb

## 2015-03-01 DIAGNOSIS — Z79899 Other long term (current) drug therapy: Secondary | ICD-10-CM

## 2015-03-01 DIAGNOSIS — R1900 Intra-abdominal and pelvic swelling, mass and lump, unspecified site: Secondary | ICD-10-CM

## 2015-03-01 DIAGNOSIS — K625 Hemorrhage of anus and rectum: Secondary | ICD-10-CM

## 2015-03-01 DIAGNOSIS — J90 Pleural effusion, not elsewhere classified: Secondary | ICD-10-CM

## 2015-03-01 DIAGNOSIS — M5136 Other intervertebral disc degeneration, lumbar region: Secondary | ICD-10-CM

## 2015-03-01 DIAGNOSIS — R1904 Left lower quadrant abdominal swelling, mass and lump: Secondary | ICD-10-CM | POA: Diagnosis not present

## 2015-03-01 DIAGNOSIS — D509 Iron deficiency anemia, unspecified: Secondary | ICD-10-CM

## 2015-03-01 DIAGNOSIS — R188 Other ascites: Secondary | ICD-10-CM

## 2015-03-01 DIAGNOSIS — D649 Anemia, unspecified: Secondary | ICD-10-CM

## 2015-03-01 LAB — CBC WITH DIFFERENTIAL/PLATELET
Basophils Absolute: 0.2 10*3/uL — ABNORMAL HIGH (ref 0–0.1)
Basophils Relative: 2 %
EOS PCT: 4 %
Eosinophils Absolute: 0.3 10*3/uL (ref 0–0.7)
HCT: 32.5 % — ABNORMAL LOW (ref 35.0–47.0)
HEMOGLOBIN: 10.1 g/dL — AB (ref 12.0–16.0)
LYMPHS ABS: 1.1 10*3/uL (ref 1.0–3.6)
LYMPHS PCT: 16 %
MCH: 20 pg — AB (ref 26.0–34.0)
MCHC: 30.9 g/dL — ABNORMAL LOW (ref 32.0–36.0)
MCV: 64.8 fL — AB (ref 80.0–100.0)
Monocytes Absolute: 0.5 10*3/uL (ref 0.2–0.9)
Monocytes Relative: 7 %
Neutro Abs: 5.2 10*3/uL (ref 1.4–6.5)
Neutrophils Relative %: 71 %
PLATELETS: 489 10*3/uL — AB (ref 150–440)
RBC: 5.02 MIL/uL (ref 3.80–5.20)
RDW: 39 % — ABNORMAL HIGH (ref 11.5–14.5)
WBC: 7.3 10*3/uL (ref 3.6–11.0)

## 2015-03-01 NOTE — Progress Notes (Signed)
[place] @ [facility] [phone]  Fax:(336) [place] OB: 11-Nov-1940  MR#: [id number]  [id number]  Patient Care Team: No Pcp Per Patient as PCP - General (General Practice)  CHIEF COMPLAINT:  Chief Complaint  Patient presents with  . Pelvic Mass  1. large adnexal mass. Chief Complaint/Diagnosis:   1.iron deficiency anemia most likely secondary to blood loss from vaginal bleeding Patient has refused further workup and blood transfusion during the hospital hospitalization has signed [place] 2.  CT scan showing abnormal mass in pelvic area evaluated today by gynecologist oncologist Dr. Jacquelyne Balint   No history exists.    Oncology Flowsheet 02/15/2013 01/30/2015 01/30/2015  diphenhydrAMINE (BENADRYL) PO 25 mg - -  LORazepam (ATIVAN) IV - 0.5 mg    ondansetron (ZOFRAN) IV - 4 mg 4 mg  promethazine (PHENERGAN) IV - 12.5 mg      INTERVAL HISTORY:   Patient came back today to reestablish care. Initially colonoscopy was planned during hospital stay but she refused but now she and her family is agreeable to proceed with colonoscopy.  She had a rectal bleeding also has a large ovarian mass. Today she complains of toothache for 2 days duration and has a dental appointment today. Increasing abdominal distention.  Hemoglobin after multiple transfusions and has stabilized to 10 Iron saturation is still very low.  Total iron is low. REVIEW OF SYSTEMS:   Gen. status: Performance status is 1. Lungs: Shortness of breath on exertion patient is in wheelchair. GI: Increasing abdominal distention. GU: No dysuria hematuria HEENT: Toothache no other visual disturbances Lower extremity: Swelling 1+. Skin: No rash Scleral skeletal system no bony pain Other systems have been reviewed.  Family was present. As per HPI. Otherwise, a complete review of systems is negatve.  PAST MEDICAL HISTORY: Past Medical History  Diagnosis Date  . Anemia    . Hypertension     PAST SURGICAL HISTORY: Past Surgical History  Procedure Laterality Date  . None      FAMILY HISTORY No family history on file.  ADVANCED DIRECTIVES:  No flowsheet data found.  HEALTH MAINTENANCE: Social History  Substance Use Topics  . Smoking status: Never Smoker   . Smokeless tobacco: None  . Alcohol Use: No      No Known Allergies  Current Outpatient Prescriptions  Medication Sig Dispense Refill  . acetaminophen (TYLENOL) 325 MG tablet Take 650 mg by mouth every 6 (six) hours as needed for moderate pain.    Marland Kitchen amitriptyline (ELAVIL) 25 MG tablet Take by mouth.    . hydrochlorothiazide (MICROZIDE) 12.5 MG capsule Take 12.5 mg by mouth daily.    . hyoscyamine (LEVSIN SL) 0.125 MG SL tablet Place under the tongue.    . magnesium oxide (MAG-OX) 400 MG tablet Take 400 mg by mouth daily.    . ondansetron (ZOFRAN-ODT) 4 MG disintegrating tablet Take by mouth.    . traMADol (ULTRAM) 50 MG tablet Take by mouth every 6 (six) hours as needed.    . vitamin B-12 (CYANOCOBALAMIN) 100 MCG tablet Take 100 mcg by mouth daily.     No current facility-administered medications for this visit.    No Known Allergies:   Preventive Screening:  Has patient had any of the following test? Colonscopy  Mammography  Pap Smear (1)   Last Colonoscopy: n/a(1)   Last Mammography: unsure(1)   Last Pap Smear: unsure(1)   Smoking History: Smoking History Never Smoked.(1)  PFSH: Family History: noncontributory  Social History: negative alcohol, negative tobacco  Additional Past Medical and Surgical History: patient  denies any POSS history of medical illness    very  poor historian and noncompliance to medical advice   OBJECTIVE:  Filed Vitals:   03/01/15 0950  BP: 196/94  Pulse: 98  Temp: 96.9 F (36.1 C)  Resp: 18     Body mass index is 40.67 kg/(m^2).    ECOG FS:1 - Symptomatic but completely ambulatory  PHYSICAL EXAM: Gen. status: Patient is in  wheelchair.  Limited ambulation. Patient is a resident of an assisted living facility Lymphatic system: Supraclavicular, cervical, axillary, inguinal lymph nodes are not palpable HEENT: Inflammation of tooth being attended by dentist Pale looking sclera Cardiac: Tachycardia soft systolic murmur GI: Abdominal distention tenderness GU: examined   GYN oncologist. Neurological system no localizing signs Other systems have been examined     LAB RESULTS:  CBC Latest Ref Rng 03/01/2015 02/23/2015  WBC 3.6 - 11.0 K/uL 7.3 6.3  Hemoglobin 12.0 - 16.0 g/dL 10.1(L) 10.6(L)  Hematocrit 35.0 - 47.0 % 32.5(L) 34.8(L)  Platelets 150 - 440 K/uL 489(H) 573(H)    Appointment on 03/01/2015  Component Date Value Ref Range Status  . WBC 03/01/2015 7.3  3.6 - 11.0 K/uL Final  . RBC 03/01/2015 5.02  3.80 - 5.20 MIL/uL Final  . Hemoglobin 03/01/2015 10.1* 12.0 - 16.0 g/dL Final  . HCT 03/01/2015 32.5* 35.0 - 47.0 % Final  . MCV 03/01/2015 64.8* 80.0 - 100.0 fL Final  . MCH 03/01/2015 20.0* 26.0 - 34.0 pg Final  . MCHC 03/01/2015 30.9* 32.0 - 36.0 g/dL Final  . RDW 03/01/2015 39.0* 11.5 - 14.5 % Final  . Platelets 03/01/2015 489* 150 - 440 K/uL Final  . Neutrophils Relative % 03/01/2015 71   Final  . Neutro Abs 03/01/2015 5.2  1.4 - 6.5 K/uL Final  . Lymphocytes Relative 03/01/2015 16   Final  . Lymphs Abs 03/01/2015 1.1  1.0 - 3.6 K/uL Final  . Monocytes Relative 03/01/2015 7   Final  . Monocytes Absolute 03/01/2015 0.5  0.2 - 0.9 K/uL Final  . Eosinophils Relative 03/01/2015 4   Final  . Eosinophils Absolute 03/01/2015 0.3  0 - 0.7 K/uL Final  . Basophils Relative 03/01/2015 2   Final  . Basophils Absolute 03/01/2015 0.2* 0 - 0.1 K/uL Final       STUDIES: Ct Abdomen Pelvis W Contrast  01/31/2015  CLINICAL DATA:  Abdominal distention EXAM: CT ABDOMEN AND PELVIS WITH CONTRAST TECHNIQUE: Multidetector CT imaging of the abdomen and pelvis was performed using the standard protocol following bolus  administration of intravenous contrast. CONTRAST:  167mL OMNIPAQUE IOHEXOL 300 MG/ML  SOLN COMPARISON:  07/26/2013 FINDINGS: There is extensive stranding throughout the patient's fat, including intraperitoneal and subcutaneous fat. Small bilateral pleural effusions and bibasilar atelectasis. There is a moderate amount of ascites in the abdomen. No obvious evidence of omental caking. Liver is grossly within normal limits The lesion in the spleen previously visualized is obscured. Kidneys, adrenal glands, and pancreas are grossly within normal limits The left adnexal mass has markedly enlarged, now measuring 16.9 cm and previously measuring 8 cm. It is heterogeneous with solid common calcified, and and fluid elements. The metal object within the uterus is again noted and is stable. Bladder is unremarkable. No vertebral compression deformity. 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 IMPRESSION: Left adnexal mass has markedly enlarged worrisome for malignancy. It is associated with ascites. Peritoneal spread of tumor  cannot be excluded. There is no obvious omental caking. There are small pleural effusions and diffuse adipose edema suggesting anasarca. Electronically Signed   By: Marybelle Killings M.D.   On: 01/31/2015 11:22   US Venous Img Lower Unilateral Left  01/31/2015  CLINICAL DATA:  75 year old female with left leg pain and swelling for 1 month. EXAM: LEFT LOWER EXTREMITY VENOUS DOPPLER ULTRASOUND TECHNIQUE: Gray-scale sonography with graded compression, as well as color Doppler and duplex ultrasound were performed to evaluate the lower extremity deep venous systems from the level of the common femoral vein and including the common femoral, femoral, profunda femoral, popliteal and calf veins including the posterior tibial, peroneal and gastrocnemius veins when visible. The superficial great saphenous vein was also interrogated. Spectral Doppler was utilized to evaluate flow at rest and with  distal augmentation maneuvers in the common femoral, femoral and popliteal veins. COMPARISON:  None. FINDINGS: Deep venous system appears patent and compressible from groin through popliteal fossae. Spontaneous venous flow present with evidence of respiratory phasicity. Augmentation intact. No intraluminal thrombus identified. Visualized portions of the greater saphenous veins patent. The right common femoral vein is patent without DVT. IMPRESSION: No evidence of left lower extremity deep venous thrombosis. Electronically Signed   By: Margarette Canada M.D.   On: 01/31/2015 09:39    ASSESSMENT: Large ovarian mass which is increased in size from the previous examination CT scan has been reviewed independently CA-125 is also rising and moderately elevated GYN oncology evaluation done surgery is pending depending on colonoscopy report  2, iron deficiency anemia Proceed with intravenous iron Colonoscopy pending Reevaluate patient after colonoscopies done. Proceed with Feraheme  On the CT scan and x-rays have been reviewed Family was present All lab data has been reviewed  Patient expressed understanding and was in agreement with this plan. She also understands that She can call clinic at any time with any questions, concerns, or complaints.    No matching staging information was found for the patient.  Forest Gleason, MD   03/01/2015 10:05 AM

## 2015-03-07 DIAGNOSIS — R2681 Unsteadiness on feet: Secondary | ICD-10-CM | POA: Diagnosis not present

## 2015-03-07 DIAGNOSIS — F3289 Other specified depressive episodes: Secondary | ICD-10-CM | POA: Diagnosis not present

## 2015-03-07 DIAGNOSIS — M6281 Muscle weakness (generalized): Secondary | ICD-10-CM | POA: Diagnosis not present

## 2015-03-07 DIAGNOSIS — R4182 Altered mental status, unspecified: Secondary | ICD-10-CM | POA: Diagnosis not present

## 2015-03-07 DIAGNOSIS — Z9181 History of falling: Secondary | ICD-10-CM | POA: Diagnosis not present

## 2015-03-07 DIAGNOSIS — I10 Essential (primary) hypertension: Secondary | ICD-10-CM | POA: Diagnosis not present

## 2015-03-09 ENCOUNTER — Inpatient Hospital Stay: Payer: Medicare Other | Attending: Oncology

## 2015-03-09 VITALS — BP 178/90 | HR 99 | Resp 20

## 2015-03-09 DIAGNOSIS — R030 Elevated blood-pressure reading, without diagnosis of hypertension: Secondary | ICD-10-CM | POA: Insufficient documentation

## 2015-03-09 DIAGNOSIS — D649 Anemia, unspecified: Secondary | ICD-10-CM

## 2015-03-09 DIAGNOSIS — N950 Postmenopausal bleeding: Secondary | ICD-10-CM | POA: Insufficient documentation

## 2015-03-09 DIAGNOSIS — R971 Elevated cancer antigen 125 [CA 125]: Secondary | ICD-10-CM | POA: Insufficient documentation

## 2015-03-09 DIAGNOSIS — D509 Iron deficiency anemia, unspecified: Secondary | ICD-10-CM | POA: Insufficient documentation

## 2015-03-09 DIAGNOSIS — R188 Other ascites: Secondary | ICD-10-CM | POA: Insufficient documentation

## 2015-03-09 DIAGNOSIS — D3912 Neoplasm of uncertain behavior of left ovary: Secondary | ICD-10-CM | POA: Insufficient documentation

## 2015-03-09 MED ORDER — SODIUM CHLORIDE 0.9 % IV SOLN
510.0000 mg | Freq: Once | INTRAVENOUS | Status: AC
  Administered 2015-03-09: 510 mg via INTRAVENOUS
  Filled 2015-03-09: qty 17

## 2015-03-09 MED ORDER — SODIUM CHLORIDE 0.9 % IV SOLN
Freq: Once | INTRAVENOUS | Status: AC
  Administered 2015-03-09: 14:00:00 via INTRAVENOUS
  Filled 2015-03-09: qty 1000

## 2015-03-10 DIAGNOSIS — R4182 Altered mental status, unspecified: Secondary | ICD-10-CM | POA: Diagnosis not present

## 2015-03-10 DIAGNOSIS — R2681 Unsteadiness on feet: Secondary | ICD-10-CM | POA: Diagnosis not present

## 2015-03-10 DIAGNOSIS — Z9181 History of falling: Secondary | ICD-10-CM | POA: Diagnosis not present

## 2015-03-10 DIAGNOSIS — I10 Essential (primary) hypertension: Secondary | ICD-10-CM | POA: Diagnosis not present

## 2015-03-10 DIAGNOSIS — F3289 Other specified depressive episodes: Secondary | ICD-10-CM | POA: Diagnosis not present

## 2015-03-10 DIAGNOSIS — M6281 Muscle weakness (generalized): Secondary | ICD-10-CM | POA: Diagnosis not present

## 2015-03-11 ENCOUNTER — Encounter: Payer: Self-pay | Admitting: *Deleted

## 2015-03-11 DIAGNOSIS — R2681 Unsteadiness on feet: Secondary | ICD-10-CM | POA: Diagnosis not present

## 2015-03-11 DIAGNOSIS — K219 Gastro-esophageal reflux disease without esophagitis: Secondary | ICD-10-CM | POA: Diagnosis not present

## 2015-03-11 DIAGNOSIS — M150 Primary generalized (osteo)arthritis: Secondary | ICD-10-CM | POA: Diagnosis not present

## 2015-03-11 DIAGNOSIS — M6281 Muscle weakness (generalized): Secondary | ICD-10-CM | POA: Diagnosis not present

## 2015-03-11 DIAGNOSIS — F3289 Other specified depressive episodes: Secondary | ICD-10-CM | POA: Diagnosis not present

## 2015-03-11 DIAGNOSIS — R4182 Altered mental status, unspecified: Secondary | ICD-10-CM | POA: Diagnosis not present

## 2015-03-11 DIAGNOSIS — N939 Abnormal uterine and vaginal bleeding, unspecified: Secondary | ICD-10-CM | POA: Diagnosis not present

## 2015-03-11 DIAGNOSIS — Z9181 History of falling: Secondary | ICD-10-CM | POA: Diagnosis not present

## 2015-03-11 DIAGNOSIS — D519 Vitamin B12 deficiency anemia, unspecified: Secondary | ICD-10-CM | POA: Diagnosis not present

## 2015-03-11 DIAGNOSIS — G894 Chronic pain syndrome: Secondary | ICD-10-CM | POA: Diagnosis not present

## 2015-03-11 DIAGNOSIS — I10 Essential (primary) hypertension: Secondary | ICD-10-CM | POA: Diagnosis not present

## 2015-03-11 DIAGNOSIS — D509 Iron deficiency anemia, unspecified: Secondary | ICD-10-CM | POA: Diagnosis not present

## 2015-03-14 ENCOUNTER — Encounter
Admission: RE | Disposition: A | Discharge status: Home / Self Care | Payer: Self-pay | Source: Ambulatory Visit | Attending: Gastroenterology

## 2015-03-14 ENCOUNTER — Ambulatory Visit: Payer: MEDICAID | Admitting: Anesthesiology

## 2015-03-14 ENCOUNTER — Ambulatory Visit
Admission: RE | Admit: 2015-03-14 | Discharge: 2015-03-14 | Disposition: A | Discharge status: Home / Self Care | Payer: Medicare Other | Source: Ambulatory Visit | Attending: Gastroenterology | Admitting: Gastroenterology

## 2015-03-14 DIAGNOSIS — K219 Gastro-esophageal reflux disease without esophagitis: Secondary | ICD-10-CM | POA: Insufficient documentation

## 2015-03-14 DIAGNOSIS — D500 Iron deficiency anemia secondary to blood loss (chronic): Secondary | ICD-10-CM | POA: Insufficient documentation

## 2015-03-14 DIAGNOSIS — R1900 Intra-abdominal and pelvic swelling, mass and lump, unspecified site: Secondary | ICD-10-CM | POA: Insufficient documentation

## 2015-03-14 DIAGNOSIS — Z800 Family history of malignant neoplasm of digestive organs: Secondary | ICD-10-CM | POA: Insufficient documentation

## 2015-03-14 DIAGNOSIS — K5730 Diverticulosis of large intestine without perforation or abscess without bleeding: Secondary | ICD-10-CM | POA: Insufficient documentation

## 2015-03-14 DIAGNOSIS — I10 Essential (primary) hypertension: Secondary | ICD-10-CM | POA: Insufficient documentation

## 2015-03-14 DIAGNOSIS — Z79899 Other long term (current) drug therapy: Secondary | ICD-10-CM | POA: Insufficient documentation

## 2015-03-14 DIAGNOSIS — K921 Melena: Secondary | ICD-10-CM | POA: Insufficient documentation

## 2015-03-14 HISTORY — DX: Gastro-esophageal reflux disease without esophagitis: K21.9

## 2015-03-14 HISTORY — PX: ESOPHAGOGASTRODUODENOSCOPY (EGD) WITH PROPOFOL: SHX5813

## 2015-03-14 HISTORY — DX: Abdominal distension (gaseous): R14.0

## 2015-03-14 HISTORY — PX: COLONOSCOPY WITH PROPOFOL: SHX5780

## 2015-03-14 SURGERY — COLONOSCOPY WITH PROPOFOL
Anesthesia: General

## 2015-03-14 MED ORDER — SODIUM CHLORIDE 0.9 % IV SOLN
INTRAVENOUS | Status: DC
  Administered 2015-03-14: 1000 mL via INTRAVENOUS

## 2015-03-14 MED ORDER — LIDOCAINE HCL (PF) 1 % IJ SOLN
2.0000 mL | Freq: Once | INTRAMUSCULAR | Status: AC
  Administered 2015-03-14: 0.03 mL via INTRADERMAL
  Filled 2015-03-14: qty 2

## 2015-03-14 MED ORDER — MIDAZOLAM HCL 2 MG/2ML IJ SOLN
INTRAMUSCULAR | Status: DC | PRN
  Administered 2015-03-14: 1 mg via INTRAVENOUS

## 2015-03-14 MED ORDER — PROPOFOL 500 MG/50ML IV EMUL
INTRAVENOUS | Status: DC | PRN
Start: 2015-03-14 — End: 2015-03-14
  Administered 2015-03-14: 80 ug/kg/min via INTRAVENOUS

## 2015-03-14 MED ORDER — SODIUM CHLORIDE 0.9 % IV SOLN
INTRAVENOUS | Status: DC
  Administered 2015-03-14: 16:00:00 via INTRAVENOUS

## 2015-03-14 MED ORDER — PROPOFOL 10 MG/ML IV BOLUS
INTRAVENOUS | Status: DC | PRN
  Administered 2015-03-14: 20 mg via INTRAVENOUS
  Administered 2015-03-14: 40 mg via INTRAVENOUS

## 2015-03-14 NOTE — Op Note (Signed)
[facility]astroenterology Patient Name: [name] Procedure Date: 03/14/2015 4:28 PM MRN: [id number] Account #: [id number] Date of Birth: Jan 18, 1941 Admit Type: Outpatient Age: 75 Room: [facility] ENDO ROOM 4 Gender: Female Note Status: Finalized Procedure:         Colonoscopy Indications:       Rectal bleeding, Iron deficiency anemia, Pelvic mass Providers:         Lupita Dawn. Candace Cruise, MD Referring MD:      Forest Gleason Md, MD (Referring MD) Medicines:         Monitored Anesthesia Care Complications:     No immediate complications. Procedure:         Pre-Anesthesia Assessment:                    - Prior to the procedure, a History and Physical was                     performed, and patient medications, allergies and                     sensitivities were reviewed. The patient's tolerance of                     previous anesthesia was reviewed.                    - The risks and benefits of the procedure and the sedation                     options and risks were discussed with the patient. All                     questions were answered and informed consent was obtained.                    - After reviewing the risks and benefits, the patient was                     deemed in satisfactory condition to undergo the procedure.                    After obtaining informed consent, the colonoscope was                     passed under direct vision. Throughout the procedure, the                     patient's blood pressure, pulse, and oxygen saturations                     were monitored continuously. The Olympus CF-H180AL                     colonoscope ( S#: [id number] ) was introduced through the                     anus and advanced to the the cecum, identified by                     appendiceal orifice and ileocecal valve. The colonoscopy                     was performed without difficulty. The patient tolerated  the procedure well. The quality of the bowel  preparation                     was fair. Findings:      A few small-mouthed diverticula were found in the sigmoid colon. No       evidence of bleeding or tumor invasion into the colon.      The exam was otherwise without abnormality. Impression:        - Preparation of the colon was fair.                    - Diverticulosis in the sigmoid colon.                    - The examination was otherwise normal.                    - No specimens collected. Recommendation:    - Discharge patient to home.                    - The findings and recommendations were discussed with the                     patient's family. Procedure Code(s): --- Professional ---                    [phone], Colonoscopy, flexible; diagnostic, including                     collection of specimen(s) by brushing or washing, when                     performed (separate procedure) Diagnosis Code(s): --- Professional ---                    K62.5, Hemorrhage of anus and rectum                    D50.9, Iron deficiency anemia, unspecified                    K57.30, Diverticulosis of large intestine without                     perforation or abscess without bleeding CPT copyright 2016 [employer]. All rights reserved. The codes documented in this report are preliminary and upon coder review may  be revised to meet current compliance requirements. Hulen Luster, MD 03/14/2015 4:54:02 PM This report has been signed electronically. Number of Addenda: 0 Note Initiated On: 03/14/2015 4:28 PM Scope Withdrawal Time: 0 hours 4 minutes 55 seconds  Total Procedure Duration: 0 hours 8 minutes 57 seconds       [facility]

## 2015-03-14 NOTE — Op Note (Signed)
[facility] Gastroenterology Patient Name: [name] Procedure Date: 03/14/2015 4:29 PM MRN: [id number] Account #: [id number] Date of Birth: 10-23-1940 Admit Type: Outpatient Age: 75 Room: [facility] ENDO ROOM 4 Gender: Female Note Status: Finalized Procedure:         Upper GI endoscopy Indications:       Iron deficiency anemia secondary to chronic blood loss,                     Iron deficiency anemia, Hematochezia Providers:         Lupita Dawn. Candace Cruise, MD Referring MD:      Forest Gleason Md, MD (Referring MD) Medicines:         Monitored Anesthesia Care Complications:     No immediate complications. Procedure:         Pre-Anesthesia Assessment:                    - Prior to the procedure, a History and Physical was                     performed, and patient medications, allergies and                     sensitivities were reviewed. The patient's tolerance of                     previous anesthesia was reviewed.                    - The risks and benefits of the procedure and the sedation                     options and risks were discussed with the patient. All                     questions were answered and informed consent was obtained.                    - After reviewing the risks and benefits, the patient was                     deemed in satisfactory condition to undergo the procedure.                    After obtaining informed consent, the endoscope was passed                     under direct vision. Throughout the procedure, the                     patient's blood pressure, pulse, and oxygen saturations                     were monitored continuously. The Endoscope was introduced                     through the mouth, and advanced to the second part of                     duodenum. The upper GI endoscopy was accomplished without                     difficulty. The patient tolerated the procedure well. Findings:      The examined esophagus was normal.  The  entire examined stomach was normal.      The examined duodenum was normal. Impression:        - Normal esophagus.                    - Normal stomach.                    - Normal examined duodenum.                    - No specimens collected. Recommendation:    - Discharge patient to home.                    - Observe patient's clinical course.                    - The findings and recommendations were discussed with the                     patient. Procedure Code(s): --- Professional ---                    [phone], Esophagogastroduodenoscopy, flexible, transoral;                     diagnostic, including collection of specimen(s) by                     brushing or washing, when performed (separate procedure) Diagnosis Code(s): --- Professional ---                    D50.0, Iron deficiency anemia secondary to blood loss                     (chronic)                    D50.9, Iron deficiency anemia, unspecified                    K92.1, Melena (includes Hematochezia) CPT copyright 2016 American Medical Association. All rights reserved. The codes documented in this report are preliminary and upon coder review may  be revised to meet current compliance requirements. Hulen Luster, MD 03/14/2015 4:38:55 PM This report has been signed electronically. Number of Addenda: 0 Note Initiated On: 03/14/2015 4:29 PM      [facility]

## 2015-03-14 NOTE — Anesthesia Preprocedure Evaluation (Addendum)
Anesthesia Evaluation  Patient identified by MRN, date of birth, ID band Patient awake    Reviewed: Allergy & Precautions, H&P , NPO status , Patient's Chart, lab work & pertinent test results, reviewed documented beta blocker date and time   Airway Mallampati: II   Neck ROM: full    Dental  (+) Poor Dentition   Pulmonary neg pulmonary ROS,    Pulmonary exam normal        Cardiovascular hypertension, negative cardio ROS Normal cardiovascular exam     Neuro/Psych negative neurological ROS  negative psych ROS   GI/Hepatic negative GI ROS, Neg liver ROS, GERD  ,  Endo/Other  negative endocrine ROS  Renal/GU negative Renal ROS  negative genitourinary   Musculoskeletal   Abdominal   Peds  Hematology negative hematology ROS (+) anemia ,   Anesthesia Other Findings Past Medical History:   Anemia                                                       Hypertension                                                 GERD (gastroesophageal reflux disease)                       Abdominal distension                                       Past Surgical History:   none                                                        BMI    Body Mass Index   44.29 kg/m 2     Reproductive/Obstetrics                            Anesthesia Physical Anesthesia Plan  ASA: III  Anesthesia Plan: General   Post-op Pain Management:    Induction:   Airway Management Planned:   Additional Equipment:   Intra-op Plan:   Post-operative Plan:   Informed Consent: I have reviewed the patients History and Physical, chart, labs and discussed the procedure including the risks, benefits and alternatives for the proposed anesthesia with the patient or authorized representative who has indicated his/her understanding and acceptance.   Dental Advisory Given  Plan Discussed with: CRNA  Anesthesia Plan Comments:          Anesthesia Quick Evaluation

## 2015-03-14 NOTE — H&P (Signed)
  Date of Initial H&P: 02/14/2015  History reviewed, patient examined, no change in status, stable for surgery.

## 2015-03-14 NOTE — Transfer of Care (Signed)
Immediate Anesthesia Transfer of Care Note  Patient: [name]  Procedure(s) Performed: Procedure(s): COLONOSCOPY WITH PROPOFOL (N/A) ESOPHAGOGASTRODUODENOSCOPY (EGD) WITH PROPOFOL (N/A)  Patient Location: PACU  Anesthesia Type:General  Level of Consciousness: awake, alert  and oriented  Airway & Oxygen Therapy: Patient Spontanous Breathing and Patient connected to nasal cannula oxygen  Post-op Assessment: Report given to RN and Post -op Vital signs reviewed and stable  Post vital signs: Reviewed and stable  Last Vitals:  Filed Vitals:   03/14/15 1419 03/14/15 1700  BP: 171/74 167/87  Pulse: 91 100  Temp: 36.9 C 37.1 C  Resp: 17 13    Complications: No apparent anesthesia complications

## 2015-03-15 DIAGNOSIS — F3289 Other specified depressive episodes: Secondary | ICD-10-CM | POA: Diagnosis not present

## 2015-03-15 DIAGNOSIS — Z9181 History of falling: Secondary | ICD-10-CM | POA: Diagnosis not present

## 2015-03-15 DIAGNOSIS — R4182 Altered mental status, unspecified: Secondary | ICD-10-CM | POA: Diagnosis not present

## 2015-03-15 DIAGNOSIS — I10 Essential (primary) hypertension: Secondary | ICD-10-CM | POA: Diagnosis not present

## 2015-03-15 DIAGNOSIS — R2681 Unsteadiness on feet: Secondary | ICD-10-CM | POA: Diagnosis not present

## 2015-03-15 DIAGNOSIS — M6281 Muscle weakness (generalized): Secondary | ICD-10-CM | POA: Diagnosis not present

## 2015-03-16 ENCOUNTER — Inpatient Hospital Stay (HOSPITAL_BASED_OUTPATIENT_CLINIC_OR_DEPARTMENT_OTHER): Payer: Medicaid Other | Admitting: Obstetrics and Gynecology

## 2015-03-16 ENCOUNTER — Inpatient Hospital Stay: Payer: Medicare Other

## 2015-03-16 ENCOUNTER — Inpatient Hospital Stay (HOSPITAL_BASED_OUTPATIENT_CLINIC_OR_DEPARTMENT_OTHER): Payer: Medicare Other | Admitting: Oncology

## 2015-03-16 ENCOUNTER — Encounter: Payer: Self-pay | Admitting: Gastroenterology

## 2015-03-16 ENCOUNTER — Encounter: Payer: Self-pay | Admitting: Obstetrics and Gynecology

## 2015-03-16 VITALS — BP 175/96 | HR 98 | Temp 98.0°F | Resp 18

## 2015-03-16 VITALS — BP 182/85 | HR 98 | Temp 97.2°F | Resp 18 | Wt 212.4 lb

## 2015-03-16 DIAGNOSIS — N950 Postmenopausal bleeding: Secondary | ICD-10-CM

## 2015-03-16 DIAGNOSIS — D3912 Neoplasm of uncertain behavior of left ovary: Secondary | ICD-10-CM

## 2015-03-16 DIAGNOSIS — D500 Iron deficiency anemia secondary to blood loss (chronic): Secondary | ICD-10-CM

## 2015-03-16 DIAGNOSIS — R4182 Altered mental status, unspecified: Secondary | ICD-10-CM | POA: Diagnosis not present

## 2015-03-16 DIAGNOSIS — Z9181 History of falling: Secondary | ICD-10-CM | POA: Diagnosis not present

## 2015-03-16 DIAGNOSIS — R971 Elevated cancer antigen 125 [CA 125]: Secondary | ICD-10-CM

## 2015-03-16 DIAGNOSIS — R188 Other ascites: Secondary | ICD-10-CM

## 2015-03-16 DIAGNOSIS — D649 Anemia, unspecified: Secondary | ICD-10-CM

## 2015-03-16 DIAGNOSIS — F3289 Other specified depressive episodes: Secondary | ICD-10-CM | POA: Diagnosis not present

## 2015-03-16 DIAGNOSIS — M6281 Muscle weakness (generalized): Secondary | ICD-10-CM | POA: Diagnosis not present

## 2015-03-16 DIAGNOSIS — R1900 Intra-abdominal and pelvic swelling, mass and lump, unspecified site: Secondary | ICD-10-CM

## 2015-03-16 DIAGNOSIS — N838 Other noninflammatory disorders of ovary, fallopian tube and broad ligament: Secondary | ICD-10-CM

## 2015-03-16 DIAGNOSIS — R2681 Unsteadiness on feet: Secondary | ICD-10-CM | POA: Diagnosis not present

## 2015-03-16 DIAGNOSIS — R030 Elevated blood-pressure reading, without diagnosis of hypertension: Secondary | ICD-10-CM

## 2015-03-16 DIAGNOSIS — I10 Essential (primary) hypertension: Secondary | ICD-10-CM | POA: Diagnosis not present

## 2015-03-16 DIAGNOSIS — D509 Iron deficiency anemia, unspecified: Secondary | ICD-10-CM

## 2015-03-16 MED ORDER — SODIUM CHLORIDE 0.9 % IV SOLN
Freq: Once | INTRAVENOUS | Status: AC
  Administered 2015-03-16: 16:00:00 via INTRAVENOUS
  Filled 2015-03-16: qty 1000

## 2015-03-16 MED ORDER — SODIUM CHLORIDE 0.9 % IV SOLN
510.0000 mg | Freq: Once | INTRAVENOUS | Status: AC
  Administered 2015-03-16: 510 mg via INTRAVENOUS
  Filled 2015-03-16: qty 17

## 2015-03-16 NOTE — Progress Notes (Signed)
  Oncology Nurse Navigator Documentation  Navigator Location: [facility] (03/16/15 1500) Navigator Encounter Type: Follow-up Appt (Gyn Oncology) (03/16/15 1500)           Patient Visit Type: Follow-up (03/16/15 1500)   Barriers/Navigation Needs: Coordination of Care (03/16/15 1500)                Acuity: Level 4 (03/16/15 1500)         Time Spent with Patient: 73 (03/16/15 1500)   Met with [name] and ex spouse in exam room along with Dr Theora Gianotti. She went over her history of events thoroughly, including 2015 hospitalization with findings of 8cm mass, CA 125 results, pap smear results from 2016. Pt had refused any surgery at that time. Recent hospitalization events from 01/2015 were recalled including mass growth to 17cm, education regarding low hgb, possible bleeding from mass, Ct imaging, recent CEA,CA 125, recent negative endometrial biopsy and pap. Also discussed endoscopy/colonoscopy results. Appt was rearranged with Dr Fransisca Connors at [facility] for 03/25/15 1430 for surgery planning. Directions to his office printed and given to her. Also appt for Dr Einar Pheasant was made for primary care/HTN management for 03/17/15 1030. Provided directions and phone number for any appt changes if she could not make appt tomorrow. She and spouse verbalized understanding.

## 2015-03-16 NOTE — Progress Notes (Signed)
George Mason @ [facility] [phone]  Fax:([phone] Lorenzo OB: 03/29/1940  MR#: [id number]  [id number]  Patient Care Team: No Pcp Per Patient as PCP - General (General Practice)  CHIEF COMPLAINT:  Chief Complaint  Patient presents with  . Pelvic Mass  1. large adnexal mass. Chief Complaint/Diagnosis:   1.iron deficiency anemia most likely secondary to blood loss from vaginal bleeding Patient has refused further workup and blood transfusion during the hospital hospitalization has signed [place] 2.  CT scan showing abnormal mass in pelvic area evaluated today by gynecologist oncologist Dr. Jacquelyne Balint 3.  Recent and diffuse surgery at that point in time now has progressing enlargement of the ovarian mass.  Planning to get surgical intervention but by GYN oncologist (February, 2017) Upper and lower endoscopy has been done by Dr. Jenetta Downer h  reported to be negative.    No history exists.    Oncology Flowsheet 02/15/2013 01/30/2015 01/30/2015 03/09/2015  diphenhydrAMINE (BENADRYL) PO 25 mg - - -  ferumoxytol (FERAHEME) IV - - - 510 mg  LORazepam (ATIVAN) IV - 0.5 mg   -  ondansetron (ZOFRAN) IV - 4 mg 4 mg -  promethazine (PHENERGAN) IV - 12.5 mg   -    INTERVAL HISTORY:   Patient came back today to reestablish care. Initially colonoscopy was planned during hospital stay but she refused but now she and her family is agreeable to proceed with colonoscopy.  She had a rectal bleeding also has a large ovarian mass. And is here for continuation of intravenous iron.  Also being evaluated by GYN oncologist.  According to nurse navigator patient desires surgery at [facility] which is being worked on.  Upper and lower endoscopy report has been reviewed. REVIEW OF SYSTEMS:   Gen. status: Performance status is 1. Lungs: Shortness of breath on exertion patient is in wheelchair. GI: Increasing abdominal distention. GU: No dysuria  hematuria HEENT: Toothache no other visual disturbances Lower extremity: Swelling 1+. Skin: No rash Scleral skeletal system no bony pain Other systems have been reviewed.  Family was present. As per HPI. Otherwise, a complete review of systems is negatve.  PAST MEDICAL HISTORY: Past Medical History  Diagnosis Date  . Anemia   . Hypertension   . GERD (gastroesophageal reflux disease)   . Abdominal distension     PAST SURGICAL HISTORY: Past Surgical History  Procedure Laterality Date  . None    . Colonoscopy with propofol N/A 03/14/2015    Procedure: COLONOSCOPY WITH PROPOFOL;  Surgeon: Hulen Luster, MD;  Location: [facility] ENDOSCOPY;  Service: Gastroenterology;  Laterality: N/A;  . Esophagogastroduodenoscopy (egd) with propofol N/A 03/14/2015    Procedure: ESOPHAGOGASTRODUODENOSCOPY (EGD) WITH PROPOFOL;  Surgeon: Hulen Luster, MD;  Location: [facility] ENDOSCOPY;  Service: Gastroenterology;  Laterality: N/A;    FAMILY HISTORY No family history on file.  ADVANCED DIRECTIVES:  No flowsheet data found.  HEALTH MAINTENANCE: Social History  Substance Use Topics  . Smoking status: Never Smoker   . Smokeless tobacco: None  . Alcohol Use: No      No Known Allergies  Current Outpatient Prescriptions  Medication Sig Dispense Refill  . acetaminophen (TYLENOL) 325 MG tablet Take 650 mg by mouth every 6 (six) hours as needed for moderate pain.    Marland Kitchen amitriptyline (ELAVIL) 25 MG tablet Take by mouth.    Marland Kitchen ascorbic acid (VITAMIN C) 1000 MG tablet Take 1,000 mg by mouth daily.    Marland Kitchen  ferrous sulfate 325 (65 FE) MG tablet Take 325 mg by mouth 2 (two) times daily with a meal.    . folic acid (FOLVITE) 1 MG tablet Take 1 mg by mouth daily.    . hydrochlorothiazide (MICROZIDE) 12.5 MG capsule Take 12.5 mg by mouth daily.    . hyoscyamine (LEVSIN SL) 0.125 MG SL tablet Place under the tongue.    . magnesium oxide (MAG-OX) 400 MG tablet Take 400 mg by mouth daily.    . ondansetron (ZOFRAN-ODT) 4 MG  disintegrating tablet Take by mouth.    . traMADol (ULTRAM) 50 MG tablet Take by mouth every 6 (six) hours as needed.    . vitamin B-12 (CYANOCOBALAMIN) 100 MCG tablet Take 100 mcg by mouth daily.     No current facility-administered medications for this visit.    No Known Allergies:   Preventive Screening:  Has patient had any of the following test? Colonscopy  Mammography  Pap Smear (1)   Last Colonoscopy: n/a(1)   Last Mammography: unsure(1)   Last Pap Smear: unsure(1)   Smoking History: Smoking History Never Smoked.(1)  PFSH: Family History: noncontributory  Social History: negative alcohol, negative tobacco  Additional Past Medical and Surgical History: patient  denies any POSS history of medical illness    very  poor historian and noncompliance to medical advice   OBJECTIVE:  Filed Vitals:   03/16/15 1359  BP: 182/85  Pulse: 98  Temp: 97.2 F (36.2 C)  Resp: 18     Body mass index is 37.64 kg/(m^2).    ECOG FS:1 - Symptomatic but completely ambulatory  PHYSICAL EXAM: Gen. status: Patient is in wheelchair.  Limited ambulation. Patient is a resident of an assisted living facility Lymphatic system: Supraclavicular, cervical, axillary, inguinal lymph nodes are not palpable HEENT: Inflammation of tooth being attended by dentist Pale looking sclera Cardiac: Tachycardia soft systolic murmur GI: Abdominal distention tenderness GU: examined   GYN oncologist. Neurological system no localizing signs Other systems have been examined     LAB RESULTS:  CBC Latest Ref Rng 03/01/2015 02/23/2015  WBC 3.6 - 11.0 K/uL 7.3 6.3  Hemoglobin 12.0 - 16.0 g/dL 10.1(L) 10.6(L)  Hematocrit 35.0 - 47.0 % 32.5(L) 34.8(L)  Platelets 150 - 440 K/uL 489(H) 573(H)    No visits with results within 5 Day(s) from this visit. Latest known visit with results is:  Appointment on 03/01/2015  Component Date Value Ref Range Status  . WBC 03/01/2015 7.3  3.6 - 11.0 K/uL Final  . RBC  03/01/2015 5.02  3.80 - 5.20 MIL/uL Final  . Hemoglobin 03/01/2015 10.1* 12.0 - 16.0 g/dL Final  . HCT 03/01/2015 32.5* 35.0 - 47.0 % Final  . MCV 03/01/2015 64.8* 80.0 - 100.0 fL Final  . MCH 03/01/2015 20.0* 26.0 - 34.0 pg Final  . MCHC 03/01/2015 30.9* 32.0 - 36.0 g/dL Final  . RDW 03/01/2015 39.0* 11.5 - 14.5 % Final  . Platelets 03/01/2015 489* 150 - 440 K/uL Final  . Neutrophils Relative % 03/01/2015 71   Final  . Neutro Abs 03/01/2015 5.2  1.4 - 6.5 K/uL Final  . Lymphocytes Relative 03/01/2015 16   Final  . Lymphs Abs 03/01/2015 1.1  1.0 - 3.6 K/uL Final  . Monocytes Relative 03/01/2015 7   Final  . Monocytes Absolute 03/01/2015 0.5  0.2 - 0.9 K/uL Final  . Eosinophils Relative 03/01/2015 4   Final  . Eosinophils Absolute 03/01/2015 0.3  0 - 0.7 K/uL Final  . Basophils Relative  03/01/2015 2   Final  . Basophils Absolute 03/01/2015 0.2* 0 - 0.1 K/uL Final       STUDIES: No results found.  ASSESSMENT: Large ovarian mass which is increased in size from the previous examination CT scan has been reviewed independently CA-125 is also rising and moderately elevated GYN oncology evaluation done surgery is pending depending on colonoscopy report  2, iron deficiency anemia Proceed with intravenous iron Upper and lower endoscopy results have been reviewed they are negative for any malignancy  She is going to be evaluated by GYN oncology is to proceed with surgical intervention Discussed situation with GYN oncologist   Dr. Theora Gianotti  Patient expressed understanding and was in agreement with this plan. She also understands that She can call clinic at any time with any questions, concerns, or complaints.    No matching staging information was found for the patient.  Forest Gleason, MD   03/16/2015 2:19 PM

## 2015-03-16 NOTE — Progress Notes (Signed)
Gynecologic Oncology Consult Visit   Referring Provider: Dr. Ouida Sills  Chief Concern: Pelvic mass, elevated CA125, postmenopausal bleeding  Subjective:  [name] is a 75 y.o. female  P4 who was seen in consultation from Dr. Ouida Sills for for pelvic mass with postmenopausal bleeding/anemia and elevated CA125.Please see her history as noted below.   She presents today to discussed her endoscopy and colonoscopy results and recommendations for management.   Colonoscopy on 2/13 demonstrated  - Diverticulosis in the sigmoid colon. - The examination was otherwise normal.  Endoscopy on 2/13 demonstrated normal esophagus, stomach, and duodenum.   She had been in SNF at [facility] after discharge. But about a week ago she was transferred to assisted living at [facility]. With the help of OT/PT she has learned to walk again and is able to ambulate independently.   She has been seeing Dr. Oliva Bustard for management of anemia and receiving iron therapy. Her most recent hemoglobin was 10.1.   Patient has continued light spotting, abdominal pain, abdominal bloating, and diarrhea.     Gynecologic Oncology  She has a long history in that she was admitted to [facility] in 7/15 with PMB, iron deficiency anemia and an 8 cm left pelvic mass. CA125 was 10. PAP 08/21/14 AGUS. Dr Ouida Sills did an endometrial bx 08/25/14 that showed polyp with simple hyperplasia and disordered proliferative endometrium. She refused surgery offered by Dr Ouida Sills and Dr Jacquelyne Balint and was lost to follow up.   She was admitted again 01/30/15 after presenting to the emergency department because of nausea and vomiting and possibly melena. Hemoglobin was 5.3 with MCV 46.9, Hct 20.7, Plts 454k, WBC 9,600. Hgb 7.8 after 2 units of blood. CT scan 1/17 showed that the heterogeneous pelvic mass has grown to 17 cm. CT also shows some ascites, but no carcinomatosis. CA125 now 112 and CEA 1.6 normal. Dr  Ouida Sills examined the patient in his office 1/3 and endometrial biopsy and PAP were negative.02/05/15 she received an additional blood transfusion.    States she had an IUD, but not sure if it was removed.   She has been in a SNF since discharge due to immobility.  She saw Dr. Fransisca Connors on 02/23/2015. Her exam was limited due to habitus. He recommended colonoscopy/endoscopy to evaluate the source of bleeding. He also recommended that she see Dr. Nicki Reaper for management of hypertension. She did not follow up with her appointment with Dr. Einar Pheasant.      Problem List: Patient Active Problem List   Diagnosis Date Noted  . Ovarian mass 03/16/2015  . Ascites 03/16/2015  . Anemia due to chronic blood loss 03/16/2015  . Pelvic mass in female 02/02/2015  . Elevated CA-125 02/02/2015  . Postmenopausal bleeding 02/02/2015  . Morbid obesity ([place]) 02/02/2015  . Anemia 01/30/2015    Past Medical History: Past Medical History  Diagnosis Date  . Anemia   . Hypertension   . GERD (gastroesophageal reflux disease)   . Abdominal distension     Past Surgical History: Past Surgical History  Procedure Laterality Date  . None    . Colonoscopy with propofol N/A 03/14/2015    Procedure: COLONOSCOPY WITH PROPOFOL;  Surgeon: Hulen Luster, MD;  Location: [facility] ENDOSCOPY;  Service: Gastroenterology;  Laterality: N/A;  . Esophagogastroduodenoscopy (egd) with propofol N/A 03/14/2015    Procedure: ESOPHAGOGASTRODUODENOSCOPY (EGD) WITH PROPOFOL;  Surgeon: Hulen Luster, MD;  Location: [facility] ENDOSCOPY;  Service: Gastroenterology;  Laterality: N/A;   Family History: No family history on  file.  Social History: Social History   Social History  . Marital Status: Legally Separated    Spouse Name: N/A  . Number of Children: N/A  . Years of Education: N/A   Occupational History  . Not on file.   Social History Main Topics  . Smoking status: Never Smoker   . Smokeless tobacco: Not on file  . Alcohol Use:  No  . Drug Use: No  . Sexual Activity: Not on file   Other Topics Concern  . Not on file   Social History Narrative    Allergies: No Known Allergies  Current Medications: Current Outpatient Prescriptions  Medication Sig Dispense Refill  . acetaminophen (TYLENOL) 325 MG tablet Take 650 mg by mouth every 6 (six) hours as needed for moderate pain.    Marland Kitchen amitriptyline (ELAVIL) 25 MG tablet Take by mouth.    Marland Kitchen ascorbic acid (VITAMIN C) 1000 MG tablet Take 1,000 mg by mouth daily.    . ferrous sulfate 325 (65 FE) MG tablet Take 325 mg by mouth 2 (two) times daily with a meal.    . folic acid (FOLVITE) 1 MG tablet Take 1 mg by mouth daily.    . hydrochlorothiazide (MICROZIDE) 12.5 MG capsule Take 12.5 mg by mouth daily.    . hyoscyamine (LEVSIN SL) 0.125 MG SL tablet Place under the tongue.    . magnesium oxide (MAG-OX) 400 MG tablet Take 400 mg by mouth daily.    . ondansetron (ZOFRAN-ODT) 4 MG disintegrating tablet Take by mouth.    . traMADol (ULTRAM) 50 MG tablet Take by mouth every 6 (six) hours as needed.    . vitamin B-12 (CYANOCOBALAMIN) 100 MCG tablet Take 100 mcg by mouth daily.     No current facility-administered medications for this visit.    Review of Systems General: no complaints  HEENT: no complaints  Lungs: no complaints  Cardiac: no complaints  GI: See interval history  GU: See interval history  Musculoskeletal: no complaints  Extremities: no complaints  Skin: no complaints  Neuro: no complaints  Endocrine: no complaints  Psych: no complaints      Objective:  Physical Examination:  T 97.2; P=98; R=18; BP 182/85 Wt: 212 pounds   ECOG Performance Status: 2 - Symptomatic, <50% confined to bed  From Dr. Blake Divine visit 02/23/2015 General appearance: alert, cooperative and appears stated age HEENT:PERRLA, extra ocular movement intact, neck supple with midline trachea and thyroid without masses Lymph node survey: non-palpable, axillary, inguinal,  supraclavicular Cardiovascular: regular rate and rhythm, no murmurs or gallops Respiratory: normal air entry, lungs clear to auscultation and no rales, rhonchi or wheezing Breast exam: not examined. Abdomen: protuberant, without guarding, no hernias and well healed incision Back: inspection of back is normal Extremities: 1+ edema bilaterally Skin exam - normal coloration and turgor, no rashes, no suspicious skin lesions noted. Neurological exam reveals alert, oriented, normal speech, no focal findings or movement disorder noted.  Pelvic: exam chaperoned by nurse;  Vulva: normal appearing vulva with no masses, tenderness or lesions; Vagina: normal vagina; Adnexa: no masses palpable, but limited exam due to obesity; Uterus: nontender, not palpable; Cervix: anteverted; Rectal: not indicated and normal rectal, no masses  Radiologic Imaging: 01/30/15 CT ABDOMEN AND PELVIS WITH CONTRAST  There is extensive stranding throughout the patient's fat, including intraperitoneal and subcutaneous fat. Small bilateral pleural effusions and bibasilar atelectasis.  There is a moderate amount of ascites in the abdomen. No obvious evidence of omental caking. Liver is grossly within  normal limits. The lesion in the spleen previously visualized is obscured. Kidneys, adrenal glands, and pancreas are grossly within normal limits  The left adnexal mass has markedly enlarged, now measuring 16.9 cm and previously measuring 8 cm. It is heterogeneous with solid common calcified, and and fluid elements.  The metal object within the uterus is again noted and is stable. Bladder is unremarkable.   IMPRESSION: Left adnexal mass has markedly enlarged worrisome for malignancy. It is associated with ascites. Peritoneal spread of tumor cannot be excluded. There is no obvious omental caking. There are small pleural effusions and diffuse adipose edema suggesting anasarca.           Lab Results  Component Value Date    WBC 7.3 03/01/2015   HGB 10.1* 03/01/2015   HCT 32.5* 03/01/2015   MCV 64.8* 03/01/2015   PLT 489* 03/01/2015      Assessment:  Nikie Jamariona Boris is a 75 y.o. female diagnosed with large pelvic mass and elevated CA125 with postmenopausal bleeding and severe iron deficiency anemia. The ovarian mass is heterogeneous and concerning for malignancy. The PMB is also concerning for uterine cancer or other pathology even though PAP and endometrial biopsy are negative. Endometrial biopsy in 7/15 showed polyp with simple hyperplasia. Anemia secondary to gynecologic bleeding.    Medical comorbidties: morbid obesity with relative immobility and living in a SNF since hospital discharge, hypertension. Noncompliant with medical advice in the past.   Plan:   Problem List Items Addressed This Visit      Other   Postmenopausal bleeding - Primary   Ovarian mass   Ascites   Anemia due to chronic blood loss     We discussed that in view of the large ovarian mass and uterine bleeding with increased CA125, she should have surgery to remove the uterus, tubes and ovaries. She recently missed her appointment with Dr. Fransisca Connors at [facility] and we will reschedule this visit. Appointment Feb 24th, 2017 at 2:30 PM. She will also need Preop Anesthesia assessment.   In view of her persistent elevated BP we again recommended she see Dr Einar Pheasant in internal medicine for initiation of antihypertensive therapy and general physical exam in preparation for surgery.     The patient's diagnosis, an outline of the further diagnostic and laboratory studies which will be required, the recommendation, and alternatives were discussed. All questions were answered to the patient's satisfaction.    Gillis Ends, MD  Dr. Ouida Sills

## 2015-03-16 NOTE — Anesthesia Postprocedure Evaluation (Signed)
Anesthesia Post Note  Patient: [name]  Procedure(s) Performed: Procedure(s) (LRB): COLONOSCOPY WITH PROPOFOL (N/A) ESOPHAGOGASTRODUODENOSCOPY (EGD) WITH PROPOFOL (N/A)  Patient location during evaluation: Endoscopy Anesthesia Type: General Level of consciousness: oriented and awake and alert Pain management: pain level controlled Vital Signs Assessment: post-procedure vital signs reviewed and stable Respiratory status: spontaneous breathing Cardiovascular status: blood pressure returned to baseline    Last Vitals:  Filed Vitals:   03/14/15 1720 03/14/15 1730  BP: 150/76 132/78  Pulse: 98 94  Temp:    Resp: 24 16    Last Pain:  Filed Vitals:   03/15/15 0718  PainSc: 0-No pain                 Avonte Sensabaugh

## 2015-03-16 NOTE — Patient Instructions (Signed)
Appointment with Dr. Fransisca Connors Feb 24th, 2017 at 2:30 PM.  You also need to follow up with Dr. Einar Pheasant regarding management of your high blood pressure. Today it was 182/85

## 2015-03-17 ENCOUNTER — Ambulatory Visit: Payer: Medicare Other | Admitting: Family Medicine

## 2015-03-17 DIAGNOSIS — M6281 Muscle weakness (generalized): Secondary | ICD-10-CM | POA: Diagnosis not present

## 2015-03-17 DIAGNOSIS — R2681 Unsteadiness on feet: Secondary | ICD-10-CM | POA: Diagnosis not present

## 2015-03-17 DIAGNOSIS — F3289 Other specified depressive episodes: Secondary | ICD-10-CM | POA: Diagnosis not present

## 2015-03-17 DIAGNOSIS — Z9181 History of falling: Secondary | ICD-10-CM | POA: Diagnosis not present

## 2015-03-17 DIAGNOSIS — R4182 Altered mental status, unspecified: Secondary | ICD-10-CM | POA: Diagnosis not present

## 2015-03-17 DIAGNOSIS — I10 Essential (primary) hypertension: Secondary | ICD-10-CM | POA: Diagnosis not present

## 2015-03-18 DIAGNOSIS — Z9181 History of falling: Secondary | ICD-10-CM | POA: Diagnosis not present

## 2015-03-18 DIAGNOSIS — R4182 Altered mental status, unspecified: Secondary | ICD-10-CM | POA: Diagnosis not present

## 2015-03-18 DIAGNOSIS — R2681 Unsteadiness on feet: Secondary | ICD-10-CM | POA: Diagnosis not present

## 2015-03-18 DIAGNOSIS — I10 Essential (primary) hypertension: Secondary | ICD-10-CM | POA: Diagnosis not present

## 2015-03-18 DIAGNOSIS — F3289 Other specified depressive episodes: Secondary | ICD-10-CM | POA: Diagnosis not present

## 2015-03-18 DIAGNOSIS — M6281 Muscle weakness (generalized): Secondary | ICD-10-CM | POA: Diagnosis not present

## 2015-03-21 ENCOUNTER — Encounter (INDEPENDENT_AMBULATORY_CARE_PROVIDER_SITE_OTHER): Payer: Self-pay

## 2015-03-21 ENCOUNTER — Ambulatory Visit (INDEPENDENT_AMBULATORY_CARE_PROVIDER_SITE_OTHER): Payer: Self-pay | Admitting: Family Medicine

## 2015-03-21 ENCOUNTER — Encounter: Payer: Self-pay | Admitting: Family Medicine

## 2015-03-21 VITALS — BP 158/78 | HR 92 | Temp 98.1°F | Ht 63.0 in | Wt 203.2 lb

## 2015-03-21 DIAGNOSIS — G44209 Tension-type headache, unspecified, not intractable: Secondary | ICD-10-CM

## 2015-03-21 DIAGNOSIS — I10 Essential (primary) hypertension: Secondary | ICD-10-CM

## 2015-03-21 DIAGNOSIS — H539 Unspecified visual disturbance: Secondary | ICD-10-CM

## 2015-03-21 DIAGNOSIS — H547 Unspecified visual loss: Secondary | ICD-10-CM

## 2015-03-21 DIAGNOSIS — R1900 Intra-abdominal and pelvic swelling, mass and lump, unspecified site: Secondary | ICD-10-CM

## 2015-03-21 LAB — BASIC METABOLIC PANEL
BUN: 8 mg/dL (ref 6–23)
CALCIUM: 9.3 mg/dL (ref 8.4–10.5)
CO2: 26 meq/L (ref 19–32)
Chloride: 101 mEq/L (ref 96–112)
Creatinine, Ser: 0.7 mg/dL (ref 0.40–1.20)
GFR: 105.49 mL/min (ref >60.00)
GLUCOSE: 95 mg/dL (ref 70–99)
POTASSIUM: 4.4 meq/L (ref 3.5–5.1)
SODIUM: 135 meq/L (ref 135–145)

## 2015-03-21 MED ORDER — HYDROCHLOROTHIAZIDE 12.5 MG PO CAPS: 12.5000 mg | ORAL_CAPSULE | Freq: Every day | ORAL | Status: DC

## 2015-03-21 NOTE — Assessment & Plan Note (Addendum)
Not controlled. Not currently on medications. Asymptomatic at this time. We will start her on hydrochlorothiazide. We will check a metabolic panel.

## 2015-03-21 NOTE — Assessment & Plan Note (Signed)
Stable history of chronic headaches throughout her life. Unchanged recently. Neurologically intact and vision at patient's reported baseline. We'll continue to monitor. Given return precautions.

## 2015-03-21 NOTE — Patient Instructions (Signed)
Nice to see you. We will start you on hctz for your blood pressure. Please monitor your blood pressure at home. If it is >150/90 or less than 100/60 please let us know.  We will refer you to an eye doctor. If you develop chest pain, shortness of breath, change in vision, numbness, weakness, headaches, bleeding, or any new or changing symptoms please seek medical attention.

## 2015-03-21 NOTE — Progress Notes (Signed)
Patient ID: [name], female   DOB: 1940-06-28, 75 y.o.   MRN: [id number]  Hannah Rumps, MD Phone: [phone]  [name] is a 75 y.o. female who presents today for new patient visit.  HYPERTENSION Disease Monitoring Home BP Monitoring not checking Chest pain- no    Dyspnea- no Lightheadedness-  no  Edema- minimal intermittent ankle edema orthopnea-no PND-no Patient notes mild chronic intermittent headache that is frontal and aching in nature that she's had her entire life. This is not new. This is not worsened. No numbness, weakness, or change in headaches. Patient additionally notes some mild blurry vision that has been going on for many years. This is unchanged recently. She has not been seeing an eye doctor. She reports she can walk a good distance and then states that she has only walked 26 steps at one time. She was not tired with this. She recently has undergone physical therapy to help her re-learn how to walk.  She reports that they're considering doing surgery on the pelvic mass that she has. She occasionally has discomfort in her left lower and mid abdomen relating to this. She is followed by oncology and gynecology oncology for this. She takes tramadol and Tylenol for this discomfort. She states her nurse at her assisted living facility has advised her that the tramadol is not strong enough. She denies nausea, vomiting, and diarrhea with this. No fevers.  Active Ambulatory Problems    Diagnosis Date Noted  . Anemia 01/30/2015  . Pelvic mass in female 02/02/2015  . Elevated CA-125 02/02/2015  . Postmenopausal bleeding 02/02/2015  . Morbid obesity ([place]) 02/02/2015  . Ovarian mass 03/16/2015  . Ascites 03/16/2015  . Anemia due to chronic blood loss 03/16/2015  . Essential hypertension 03/21/2015  . Vision decreased 03/21/2015  . Tension headache 03/21/2015   Resolved Ambulatory Problems    Diagnosis Date Noted  . Abdominal distension    Past  Medical History  Diagnosis Date  . Hypertension   . GERD (gastroesophageal reflux disease)   . Blood in stool   . Colon polyps   . History of blood transfusion   . Urine incontinence     Family History  Problem Relation Age of Onset  . Hypertension      Social History   Social History  . Marital Status: Legally Separated    Spouse Name: N/A  . Number of Children: N/A  . Years of Education: N/A   Occupational History  . Not on file.   Social History Main Topics  . Smoking status: Never Smoker   . Smokeless tobacco: Not on file  . Alcohol Use: No  . Drug Use: No  . Sexual Activity: Not on file   Other Topics Concern  . Not on file   Social History Narrative    ROS see HPI  Objective  Physical Exam Filed Vitals:   03/21/15 1427 03/21/15 2010  BP: 152/74 158/78  Pulse: 92   Temp: 98.1 F (36.7 C)     BP Readings from Last 3 Encounters:  03/21/15 158/78  03/16/15 175/96  03/16/15 182/85   Wt Readings from Last 3 Encounters:  03/21/15 203 lb 3.2 oz (92.171 kg)  03/16/15 212 lb 7 oz (96.361 kg)  03/14/15 250 lb (113.399 kg)    Physical Exam  Constitutional: No distress.  HENT:  Head: Normocephalic and atraumatic.  Right Ear: External ear normal.  Left Ear: External ear normal.  Mouth/Throat: Oropharynx is clear and moist. No  oropharyngeal exudate.  Eyes: Conjunctivae are normal. Pupils are equal, round, and reactive to light.  Cataracts noted  Neck: Neck supple.  Cardiovascular: Normal rate, regular rhythm and normal heart sounds.  Exam reveals no gallop and no friction rub.   No murmur heard. Pulmonary/Chest: Effort normal and breath sounds normal. No respiratory distress. She has no wheezes. She has no rales.  Abdominal: Soft. Bowel sounds are normal. She exhibits no distension. There is no tenderness. There is no rebound and no guarding.  Musculoskeletal: She exhibits no edema.  Lymphadenopathy:    She has no cervical adenopathy.    Neurological: She is alert.  See vision exam otherwise CN 2-12 intact, 5/5 strength in bilateral biceps, triceps, grip, quads, hamstrings, plantar and dorsiflexion, sensation to light touch intact in bilateral UE and LE, deliberate gait, 2+ patellar reflexes  Skin: Skin is warm and dry. She is not diaphoretic.     Assessment/Plan:   Essential hypertension Not controlled. Not currently on medications. Asymptomatic at this time. We will start her on hydrochlorothiazide. We will check a metabolic panel.  Vision decreased Patient reports no acute changes in vision. Notes vision has been stable for some time. Has not seen an ophthalmologist in some time. Likely has cataracts given exam. We'll refer to ophthalmology.  Tension headache Stable history of chronic headaches throughout her life. Unchanged recently. Neurologically intact and vision at patient's reported baseline. We'll continue to monitor. Given return precautions.  Pelvic mass in female Benign abdominal exam today. We will request the [facility] from the patient's assisted living facility to determine what kind and how often of medication she is receiving for her discomfort prior to altering her treatment regimen. I did advise that we would not be prescribing any narcotics for her discomfort.    Orders Placed This Encounter  Procedures  . Basic Metabolic Panel (BMET)  . Ambulatory referral to Ophthalmology    Referral Priority:  Routine    Referral Type:  Consultation    Referral Reason:  Specialty Services Required    Requested Specialty:  Ophthalmology    Number of Visits Requested:  1    Meds ordered this encounter  Medications  . hydrochlorothiazide (MICROZIDE) 12.5 MG capsule    Sig: Take 1 capsule (12.5 mg total) by mouth daily.    Dispense:  90 capsule    Refill:  0     [name]

## 2015-03-21 NOTE — Assessment & Plan Note (Addendum)
Benign abdominal exam today. We will request the [facility] from the patient's assisted living facility to determine what kind and how often of medication she is receiving for her discomfort prior to altering her treatment regimen. I did advise that we would not be prescribing any narcotics for her discomfort.

## 2015-03-21 NOTE — Assessment & Plan Note (Signed)
Patient reports no acute changes in vision. Notes vision has been stable for some time. Has not seen an ophthalmologist in some time. Likely has cataracts given exam. We'll refer to ophthalmology.

## 2015-03-22 ENCOUNTER — Encounter: Payer: Self-pay | Admitting: General Practice

## 2015-03-22 ENCOUNTER — Encounter: Payer: Self-pay | Admitting: Family Medicine

## 2015-03-22 DIAGNOSIS — M6281 Muscle weakness (generalized): Secondary | ICD-10-CM | POA: Diagnosis not present

## 2015-03-22 DIAGNOSIS — I10 Essential (primary) hypertension: Secondary | ICD-10-CM | POA: Diagnosis not present

## 2015-03-22 DIAGNOSIS — R2681 Unsteadiness on feet: Secondary | ICD-10-CM | POA: Diagnosis not present

## 2015-03-22 DIAGNOSIS — F3289 Other specified depressive episodes: Secondary | ICD-10-CM | POA: Diagnosis not present

## 2015-03-22 DIAGNOSIS — Z9181 History of falling: Secondary | ICD-10-CM | POA: Diagnosis not present

## 2015-03-22 DIAGNOSIS — R4182 Altered mental status, unspecified: Secondary | ICD-10-CM | POA: Diagnosis not present

## 2015-03-24 DIAGNOSIS — R2681 Unsteadiness on feet: Secondary | ICD-10-CM | POA: Diagnosis not present

## 2015-03-24 DIAGNOSIS — I10 Essential (primary) hypertension: Secondary | ICD-10-CM | POA: Diagnosis not present

## 2015-03-24 DIAGNOSIS — R4182 Altered mental status, unspecified: Secondary | ICD-10-CM | POA: Diagnosis not present

## 2015-03-24 DIAGNOSIS — F3289 Other specified depressive episodes: Secondary | ICD-10-CM | POA: Diagnosis not present

## 2015-03-24 DIAGNOSIS — M6281 Muscle weakness (generalized): Secondary | ICD-10-CM | POA: Diagnosis not present

## 2015-03-24 DIAGNOSIS — Z9181 History of falling: Secondary | ICD-10-CM | POA: Diagnosis not present

## 2015-03-29 DIAGNOSIS — M6281 Muscle weakness (generalized): Secondary | ICD-10-CM | POA: Diagnosis not present

## 2015-03-29 DIAGNOSIS — D519 Vitamin B12 deficiency anemia, unspecified: Secondary | ICD-10-CM | POA: Diagnosis not present

## 2015-03-29 DIAGNOSIS — F3289 Other specified depressive episodes: Secondary | ICD-10-CM | POA: Diagnosis not present

## 2015-03-29 DIAGNOSIS — R2681 Unsteadiness on feet: Secondary | ICD-10-CM | POA: Diagnosis not present

## 2015-03-29 DIAGNOSIS — N939 Abnormal uterine and vaginal bleeding, unspecified: Secondary | ICD-10-CM | POA: Diagnosis not present

## 2015-03-29 DIAGNOSIS — Z9181 History of falling: Secondary | ICD-10-CM | POA: Diagnosis not present

## 2015-03-29 DIAGNOSIS — R4182 Altered mental status, unspecified: Secondary | ICD-10-CM | POA: Diagnosis not present

## 2015-03-29 DIAGNOSIS — I10 Essential (primary) hypertension: Secondary | ICD-10-CM | POA: Diagnosis not present

## 2015-03-31 DIAGNOSIS — M6281 Muscle weakness (generalized): Secondary | ICD-10-CM | POA: Diagnosis not present

## 2015-03-31 DIAGNOSIS — I10 Essential (primary) hypertension: Secondary | ICD-10-CM | POA: Diagnosis not present

## 2015-03-31 DIAGNOSIS — R2681 Unsteadiness on feet: Secondary | ICD-10-CM | POA: Diagnosis not present

## 2015-03-31 DIAGNOSIS — F3289 Other specified depressive episodes: Secondary | ICD-10-CM | POA: Diagnosis not present

## 2015-03-31 DIAGNOSIS — Z9181 History of falling: Secondary | ICD-10-CM | POA: Diagnosis not present

## 2015-03-31 DIAGNOSIS — R4182 Altered mental status, unspecified: Secondary | ICD-10-CM | POA: Diagnosis not present

## 2015-04-05 ENCOUNTER — Telehealth: Payer: Self-pay | Admitting: Family Medicine

## 2015-04-05 ENCOUNTER — Emergency Department: Payer: Medicare Other

## 2015-04-05 ENCOUNTER — Emergency Department
Admission: EM | Admit: 2015-04-05 | Discharge: 2015-04-05 | Disposition: A | Discharge status: Home / Self Care | Payer: Medicare Other | Attending: Emergency Medicine | Admitting: Emergency Medicine

## 2015-04-05 DIAGNOSIS — I10 Essential (primary) hypertension: Secondary | ICD-10-CM

## 2015-04-05 DIAGNOSIS — R519 Headache, unspecified: Secondary | ICD-10-CM

## 2015-04-05 DIAGNOSIS — R51 Headache: Secondary | ICD-10-CM | POA: Insufficient documentation

## 2015-04-05 DIAGNOSIS — Z79899 Other long term (current) drug therapy: Secondary | ICD-10-CM | POA: Insufficient documentation

## 2015-04-05 LAB — CBC WITH DIFFERENTIAL/PLATELET
Basophils Absolute: 0.1 10*3/uL (ref 0–0.1)
Basophils Relative: 1 %
Eosinophils Absolute: 0.2 10*3/uL (ref 0–0.7)
Eosinophils Relative: 2 %
HCT: 38.5 % (ref 35.0–47.0)
HEMOGLOBIN: 13.2 g/dL (ref 12.0–16.0)
LYMPHS ABS: 1.8 10*3/uL (ref 1.0–3.6)
Lymphocytes Relative: 19 %
MCH: 25.1 pg — ABNORMAL LOW (ref 26.0–34.0)
MCHC: 34.3 g/dL (ref 32.0–36.0)
MCV: 73.1 fL — AB (ref 80.0–100.0)
MONOS PCT: 9 %
Monocytes Absolute: 0.8 10*3/uL (ref 0.2–0.9)
NEUTROS ABS: 6.4 10*3/uL (ref 1.4–6.5)
NEUTROS PCT: 69 %
PLATELETS: 303 10*3/uL (ref 150–440)
RBC: 5.26 MIL/uL — AB (ref 3.80–5.20)
RDW: 28.3 % — AB (ref 11.5–14.5)
WBC: 9.3 10*3/uL (ref 3.6–11.0)

## 2015-04-05 LAB — BASIC METABOLIC PANEL
ANION GAP: 9 (ref 5–15)
BUN: 12 mg/dL (ref 6–20)
CHLORIDE: 101 mmol/L (ref 101–111)
CO2: 25 mmol/L (ref 22–32)
Calcium: 9.1 mg/dL (ref 8.9–10.3)
Creatinine, Ser: 0.69 mg/dL (ref 0.44–1.00)
GFR calc non Af Amer: >60 mL/min (ref >60)
Glucose, Bld: 115 mg/dL — ABNORMAL HIGH (ref 65–99)
POTASSIUM: 3.6 mmol/L (ref 3.5–5.1)
Sodium: 135 mmol/L (ref 135–145)

## 2015-04-05 NOTE — ED Notes (Addendum)
This RN and another RN unable to obtain blood at this time, will attempt again once pt returns from CT

## 2015-04-05 NOTE — ED Notes (Signed)
Pt sent from [facility] due to HTN, states this morning it was 160's/109-110.. States for the past couple of days has had a HA.. States her HA has improved from this morning.[name]

## 2015-04-05 NOTE — Discharge Instructions (Signed)
You were evaluated for high blood pressure and headache earlier today, both of which are better now.  You're given evaluation are reassuring in the emergency department tonight.  Return to the emergency room if any worsening condition having chest pain or trouble breathing, weakness, numbness, confusion or altered mental status, slurred speech or facial droop.   General Headache Without Cause A headache is pain or discomfort felt around the head or neck area. The specific cause of a headache may not be found. There are many causes and types of headaches. A few common ones are:  Tension headaches.  Migraine headaches.  Cluster headaches.  Chronic daily headaches. HOME CARE INSTRUCTIONS  Watch your condition for any changes. Take these steps to help with your condition: Managing Pain  Take over-the-counter and prescription medicines only as told by your health care provider.  Lie down in a dark, quiet room when you have a headache.  If directed, apply ice to the head and neck area:  Put ice in a plastic bag.  Place a towel between your skin and the bag.  Leave the ice on for 20 minutes, 2-3 times per day.  Use a heating pad or hot shower to apply heat to the head and neck area as told by your health care provider.  Keep lights dim if bright lights bother you or make your headaches worse. Eating and Drinking  Eat meals on a regular schedule.  Limit alcohol use.  Decrease the amount of caffeine you drink, or stop drinking caffeine. General Instructions  Keep all follow-up visits as told by your health care provider. This is important.  Keep a headache journal to help find out what may trigger your headaches. For example, write down:  What you eat and drink.  How much sleep you get.  Any change to your diet or medicines.  Try massage or other relaxation techniques.  Limit stress.  Sit up straight, and do not tense your muscles.  Do not use tobacco products,  including cigarettes, chewing tobacco, or e-cigarettes. If you need help quitting, ask your health care provider.  Exercise regularly as told by your health care provider.  Sleep on a regular schedule. Get 7-9 hours of sleep, or the amount recommended by your health care provider. SEEK MEDICAL CARE IF:   Your symptoms are not helped by medicine.  You have a headache that is different from the usual headache.  You have nausea or you vomit.  You have a fever. SEEK IMMEDIATE MEDICAL CARE IF:   Your headache becomes severe.  You have repeated vomiting.  You have a stiff neck.  You have a loss of vision.  You have problems with speech.  You have pain in the eye or ear.  You have muscular weakness or loss of muscle control.  You lose your balance or have trouble walking.  You feel faint or pass out.  You have confusion.   This information is not intended to replace advice given to you by your health care provider. Make sure you discuss any questions you have with your health care provider.   Document Released: 01/15/2005 Document Revised: 10/06/2014 Document Reviewed: 05/10/2014 Elsevier Interactive Patient Education 2016 [employer]. Hypertension Hypertension, commonly called high blood pressure, is when the force of blood pumping through your arteries is too strong. Your arteries are the blood vessels that carry blood from your heart throughout your body. A blood pressure reading consists of a higher number over a lower number, such as  110/72. The higher number (systolic) is the pressure inside your arteries when your heart pumps. The lower number (diastolic) is the pressure inside your arteries when your heart relaxes. Ideally you want your blood pressure below 120/80. Hypertension forces your heart to work harder to pump blood. Your arteries may become narrow or stiff. Having untreated or uncontrolled hypertension can cause heart attack, stroke, kidney disease, and other  problems. RISK FACTORS Some risk factors for high blood pressure are controllable. Others are not.  Risk factors you cannot control include:   Race. You may be at higher risk if you are African American.  Age. Risk increases with age.  Gender. Men are at higher risk than women before age 67 years. After age 13, women are at higher risk than men. Risk factors you can control include:  Not getting enough exercise or physical activity.  Being overweight.  Getting too much fat, sugar, calories, or salt in your diet.  Drinking too much alcohol. SIGNS AND SYMPTOMS Hypertension does not usually cause signs or symptoms. Extremely high blood pressure (hypertensive crisis) may cause headache, anxiety, shortness of breath, and nosebleed. DIAGNOSIS To check if you have hypertension, your health care provider will measure your blood pressure while you are seated, with your arm held at the level of your heart. It should be measured at least twice using the same arm. Certain conditions can cause a difference in blood pressure between your right and left arms. A blood pressure reading that is higher than normal on one occasion does not mean that you need treatment. If it is not clear whether you have high blood pressure, you may be asked to return on a different day to have your blood pressure checked again. Or, you may be asked to monitor your blood pressure at home for 1 or more weeks. TREATMENT Treating high blood pressure includes making lifestyle changes and possibly taking medicine. Living a healthy lifestyle can help lower high blood pressure. You may need to change some of your habits. Lifestyle changes may include:  Following the DASH diet. This diet is high in fruits, vegetables, and whole grains. It is low in salt, red meat, and added sugars.  Keep your sodium intake below 2,300 mg per day.  Getting at least 30-45 minutes of aerobic exercise at least 4 times per week.  Losing weight if  necessary.  Not smoking.  Limiting alcoholic beverages.  Learning ways to reduce stress. Your health care provider may prescribe medicine if lifestyle changes are not enough to get your blood pressure under control, and if one of the following is true:  You are 63-5 years of age and your systolic blood pressure is above 140.  You are 46 years of age or older, and your systolic blood pressure is above 150.  Your diastolic blood pressure is above 90.  You have diabetes, and your systolic blood pressure is over [id number] or your diastolic blood pressure is over 90.  You have kidney disease and your blood pressure is above 140/90.  You have heart disease and your blood pressure is above 140/90. Your personal target blood pressure may vary depending on your medical conditions, your age, and other factors. HOME CARE INSTRUCTIONS  Have your blood pressure rechecked as directed by your health care provider.   Take medicines only as directed by your health care provider. Follow the directions carefully. Blood pressure medicines must be taken as prescribed. The medicine does not work as well when you skip doses. Skipping  doses also puts you at risk for problems.  Do not smoke.   Monitor your blood pressure at home as directed by your health care provider. SEEK MEDICAL CARE IF:   You think you are having a reaction to medicines taken.  You have recurrent headaches or feel dizzy.  You have swelling in your ankles.  You have trouble with your vision. SEEK IMMEDIATE MEDICAL CARE IF:  You develop a severe headache or confusion.  You have unusual weakness, numbness, or feel faint.  You have severe chest or abdominal pain.  You vomit repeatedly.  You have trouble breathing. MAKE SURE YOU:   Understand these instructions.  Will watch your condition.  Will get help right away if you are not doing well or get worse.   This information is not intended to replace advice given to you  by your health care provider. Make sure you discuss any questions you have with your health care provider.   Document Released: 01/15/2005 Document Revised: 06/01/2014 Document Reviewed: 11/07/2012 Elsevier Interactive Patient Education [employer].

## 2015-04-05 NOTE — ED Notes (Signed)
MD at bedside. 

## 2015-04-05 NOTE — ED Notes (Signed)
Patient transported to CT 

## 2015-04-05 NOTE — Telephone Encounter (Signed)
Patient Name: [name]  DOB: [birth year]    Initial Comment Caller states her mother in [facility] assisted living facility told her 180/106 bp, then 190/104, wants to know if she needs to be seen in office to adjust medicaton   Nurse Assessment  Nurse: Thad Ranger, RN, Langley Gauss Date/Time (Eastern Time): 04/05/2015 1:08:15 PM  Confirm and document reason for call. If symptomatic, describe symptoms. You must click the next button to save text entered. ---Caller states her mother in [place] assisted living facility told her 180/106 bp, then 190/104, wants to know if she needs to be seen in office to adjust medication. States the pt has been complaining of a HA and dizziness. Seen by MD on approximately 03/21/15 and BP med was changed.  Has the patient traveled out of the country within the last 30 days? ---Not Applicable  Does the patient have any new or worsening symptoms? ---Yes  Will a triage be completed? ---Yes  Related visit to physician within the last 2 weeks? ---Yes  Does the PT have any chronic conditions? (i.e. diabetes, asthma, etc.) ---Yes  List chronic conditions. ---HTN  Is this a behavioral health or substance abuse call? ---No     Guidelines    Guideline Title Affirmed Question Affirmed Notes  High Blood Pressure [1] BP ? 160 / 100 AND [2] cardiac or neurologic symptoms (e.g., chest pain, difficulty breathing, unsteady gait, blurred vision)    Final Disposition User   Go to ED Now [place], RN, [employer] reports pt is seeing things that are not there such as water on the floor or bugs. Pt does not appear to be confused as she is able to follow conversation without difficulty. She c/o visual distortion. Advised to take a list of all meds she takes, both Rx and OTC, with her to the ER.   Referrals  [facility] - ED   Disagree/Comply: Comply

## 2015-04-05 NOTE — ED Provider Notes (Signed)
[facility] Emergency Department Provider Note   ____________________________________________  Time seen: Approximately 3:45pm I have reviewed the triage vital signs and the triage nursing note.  HISTORY  Chief Complaint Hypertension   Historian Patient, granddaughter  HPI [name] is a 75 y.o. 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  Was a global headache, now better after rest, lying down.  Takes hctz for htn.  No missed doses.  Chronic continued vaginal bleeding.  No headache now.    Past Medical History  Diagnosis Date  . Anemia   . Hypertension   . GERD (gastroesophageal reflux disease)   . Abdominal distension   . Blood in stool   . Colon polyps   . History of blood transfusion   . Urine incontinence     Patient Active Problem List   Diagnosis Date Noted  . Essential hypertension 03/21/2015  . Vision decreased 03/21/2015  . Tension headache 03/21/2015  . Ovarian mass 03/16/2015  . Ascites 03/16/2015  . Anemia due to chronic blood loss 03/16/2015  . Pelvic mass in female 02/02/2015  . Elevated CA-125 02/02/2015  . Postmenopausal bleeding 02/02/2015  . Morbid obesity ([place]) 02/02/2015  . Anemia 01/30/2015    Past Surgical History  Procedure Laterality Date  . None    . Colonoscopy with propofol N/A 03/14/2015    Procedure: COLONOSCOPY WITH PROPOFOL;  Surgeon: Hulen Luster, MD;  Location: [facility] ENDOSCOPY;  Service: Gastroenterology;  Laterality: N/A;  . Esophagogastroduodenoscopy (egd) with propofol N/A 03/14/2015    Procedure: ESOPHAGOGASTRODUODENOSCOPY (EGD) WITH PROPOFOL;  Surgeon: Hulen Luster, MD;  Location: [facility] ENDOSCOPY;  Service: Gastroenterology;  Laterality: N/A;    Current Outpatient Rx  Name  Route  Sig  Dispense  Refill  . acetaminophen (TYLENOL) 325 MG tablet   Oral   Take 650 mg by mouth every 6  (six) hours as needed for mild pain, fever or headache.          Marland Kitchen amitriptyline (ELAVIL) 25 MG tablet   Oral   Take 25 mg by mouth at bedtime.          . calcium carbonate (TUMS - DOSED IN MG ELEMENTAL CALCIUM) 500 MG chewable tablet   Oral   Chew 1 tablet by mouth every 6 (six) hours as needed for indigestion or heartburn.         . ferrous sulfate 325 (65 FE) MG tablet   Oral   Take 325 mg by mouth 2 (two) times daily with a meal.         . Grape Seed Extract 50 MG CAPS   Oral   Take 50 mg by mouth daily.         . hydrochlorothiazide (MICROZIDE) 12.5 MG capsule   Oral   Take 1 capsule (12.5 mg total) by mouth daily.   90 capsule   0   . hyoscyamine (LEVSIN SL) 0.125 MG SL tablet   Sublingual   Place 0.125 mg under the tongue every 8 (eight) hours as needed for cramping.          Marland Kitchen LORazepam (ATIVAN) 0.5 MG tablet   Oral   Take 0.5 mg by mouth every 8 (eight) hours as needed for anxiety.         . magnesium oxide (MAG-OX) 400 MG tablet   Oral   Take 400 mg by mouth daily.         Marland Kitchen  ondansetron (ZOFRAN) 4 MG tablet   Oral   Take 4 mg by mouth every 8 (eight) hours as needed for nausea or vomiting.         . pantoprazole (PROTONIX) 40 MG tablet   Oral   Take 40 mg by mouth daily.         . traMADol (ULTRAM) 50 MG tablet   Oral   Take 50 mg by mouth every 6 (six) hours as needed for moderate pain.          . TURMERIC PO   Oral   Take 1 tablet by mouth daily.         . vitamin B-12 (CYANOCOBALAMIN) 1000 MCG tablet   Oral   Take 1,000 mcg by mouth daily.           Allergies Review of patient's allergies indicates no known allergies.  Family History  Problem Relation Age of Onset  . Hypertension      Social History Social History  Substance Use Topics  . Smoking status: Never Smoker   . Smokeless tobacco: None  . Alcohol Use: No    Review of Systems  Constitutional: Negative for fever. Eyes: Negative for visual  changes. ENT: Negative for sore throat. Cardiovascular: Negative for chest pain. Respiratory: Negative for shortness of breath. Gastrointestinal: Negative for abdominal pain, vomiting and diarrhea. Genitourinary: Negative for dysuria. Musculoskeletal: Negative for back pain. Skin: Negative for rash. Neurological: No confusion, facial droop, weakness or numbness 10 point Review of Systems otherwise negative ____________________________________________   PHYSICAL EXAM:  VITAL SIGNS: ED Triage Vitals  Enc Vitals Group     BP 04/05/15 1358 162/77 mmHg     Pulse Rate 04/05/15 1358 93     Resp 04/05/15 1358 18     Temp 04/05/15 1358 97.9 F (36.6 C)     Temp Source 04/05/15 1358 Oral     SpO2 04/05/15 1358 100 %     Weight 04/05/15 1358 152 lb (68.947 kg)     Height 04/05/15 1358 5' 4" (1.626 m)     Head Cir --      Peak Flow --      Pain Score 04/05/15 1358 0     Pain Loc --      Pain Edu? --      Excl. in [place]? --      Constitutional: Alert and oriented. Well appearing and in no distress. HEENT   Head: Normocephalic and atraumatic.      Eyes: Conjunctivae are normal. PERRL. Normal extraocular movements.      Ears:         Nose: No congestion/rhinnorhea.   Mouth/Throat: Mucous membranes are moist.   Neck: No stridor. Cardiovascular/Chest: Normal rate, regular rhythm.  No murmurs, rubs, or gallops. Respiratory: Normal respiratory effort without tachypnea nor retractions. Breath sounds are clear and equal bilaterally. No wheezes/rales/rhonchi. Gastrointestinal: Soft. No distention, no guarding, no rebound. Obese, mild tenderness llq. Genitourinary/rectal:Deferred Musculoskeletal: Nontender with normal range of motion in all extremities. No joint effusions.  No lower extremity tenderness.  No edema. Neurologic:  Normal speech and language. No gross or focal neurologic deficits are appreciated. Skin:  Skin is warm, dry and intact. No rash noted. Psychiatric: Mood and  affect are normal. Speech and behavior are normal. Patient exhibits appropriate insight and judgment.  ____________________________________________   EKG I, Lisa Roca, MD, the attending physician have personally viewed and interpreted all ECGs.  none ____________________________________________  LABS (pertinent positives/negatives)  Basic  metabolic panel within normal limits CBC within normal limits  ____________________________________________  RADIOLOGY All Xrays were viewed by me. Imaging interpreted by Radiologist.  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 __________________________________________  PROCEDURES  Procedure(s) performed: None  Critical Care performed: None  ____________________________________________   ED COURSE / ASSESSMENT AND PLAN  Pertinent labs & imaging results that were available during my care of the patient were reviewed by me and considered in my medical decision making (see chart for details).  Patient came in for evaluation of headache which by history preceded elevated blood pressure. She did take an extra dose of blood pressure medication, and perhaps this lower the blood pressure and resolve the headache. 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  No chest pain or shortness of breath, weakness or numbness. Did not suspect cardiac or respiratory issue or stroke.  BP ok now.  Will check labs to make sure no new anemia.     CONSULTATIONS:   none   Patient / Family / Caregiver informed of clinical course, medical decision-making process, and agree with plan.   I discussed return precautions, follow-up instructions, and discharged instructions with patient and/or family.   ___________________________________________   FINAL CLINICAL IMPRESSION(S) / ED DIAGNOSES   Final  diagnoses:  Headache, unspecified headache type  Essential hypertension              Note: This dictation was prepared with Dragon dictation. Any transcriptional errors that result from this process are unintentional   Lisa Roca, MD 04/05/15 1940

## 2015-04-06 DIAGNOSIS — M6281 Muscle weakness (generalized): Secondary | ICD-10-CM | POA: Diagnosis not present

## 2015-04-06 DIAGNOSIS — R4182 Altered mental status, unspecified: Secondary | ICD-10-CM | POA: Diagnosis not present

## 2015-04-06 DIAGNOSIS — F3289 Other specified depressive episodes: Secondary | ICD-10-CM | POA: Diagnosis not present

## 2015-04-06 DIAGNOSIS — Z9181 History of falling: Secondary | ICD-10-CM | POA: Diagnosis not present

## 2015-04-06 DIAGNOSIS — I10 Essential (primary) hypertension: Secondary | ICD-10-CM | POA: Diagnosis not present

## 2015-04-06 DIAGNOSIS — R2681 Unsteadiness on feet: Secondary | ICD-10-CM | POA: Diagnosis not present

## 2015-04-08 DIAGNOSIS — Z9181 History of falling: Secondary | ICD-10-CM | POA: Diagnosis not present

## 2015-04-08 DIAGNOSIS — M150 Primary generalized (osteo)arthritis: Secondary | ICD-10-CM | POA: Diagnosis not present

## 2015-04-08 DIAGNOSIS — I10 Essential (primary) hypertension: Secondary | ICD-10-CM | POA: Diagnosis not present

## 2015-04-08 DIAGNOSIS — R2681 Unsteadiness on feet: Secondary | ICD-10-CM | POA: Diagnosis not present

## 2015-04-08 DIAGNOSIS — K219 Gastro-esophageal reflux disease without esophagitis: Secondary | ICD-10-CM | POA: Diagnosis not present

## 2015-04-08 DIAGNOSIS — G894 Chronic pain syndrome: Secondary | ICD-10-CM | POA: Diagnosis not present

## 2015-04-08 DIAGNOSIS — F3289 Other specified depressive episodes: Secondary | ICD-10-CM | POA: Diagnosis not present

## 2015-04-08 DIAGNOSIS — M6281 Muscle weakness (generalized): Secondary | ICD-10-CM | POA: Diagnosis not present

## 2015-04-08 DIAGNOSIS — D509 Iron deficiency anemia, unspecified: Secondary | ICD-10-CM | POA: Diagnosis not present

## 2015-04-08 DIAGNOSIS — R4182 Altered mental status, unspecified: Secondary | ICD-10-CM | POA: Diagnosis not present

## 2015-04-08 DIAGNOSIS — N939 Abnormal uterine and vaginal bleeding, unspecified: Secondary | ICD-10-CM | POA: Diagnosis not present

## 2015-04-08 DIAGNOSIS — D519 Vitamin B12 deficiency anemia, unspecified: Secondary | ICD-10-CM | POA: Diagnosis not present

## 2015-04-14 ENCOUNTER — Ambulatory Visit: Payer: Medicare Other | Admitting: Family Medicine

## 2015-04-14 DIAGNOSIS — R4182 Altered mental status, unspecified: Secondary | ICD-10-CM | POA: Diagnosis not present

## 2015-04-14 DIAGNOSIS — I10 Essential (primary) hypertension: Secondary | ICD-10-CM | POA: Diagnosis not present

## 2015-04-14 DIAGNOSIS — R2681 Unsteadiness on feet: Secondary | ICD-10-CM | POA: Diagnosis not present

## 2015-04-14 DIAGNOSIS — F3289 Other specified depressive episodes: Secondary | ICD-10-CM | POA: Diagnosis not present

## 2015-04-14 DIAGNOSIS — Z9181 History of falling: Secondary | ICD-10-CM | POA: Diagnosis not present

## 2015-04-14 DIAGNOSIS — M6281 Muscle weakness (generalized): Secondary | ICD-10-CM | POA: Diagnosis not present

## 2015-04-19 DIAGNOSIS — G629 Polyneuropathy, unspecified: Secondary | ICD-10-CM | POA: Diagnosis not present

## 2015-04-19 DIAGNOSIS — I10 Essential (primary) hypertension: Secondary | ICD-10-CM | POA: Diagnosis present

## 2015-04-19 DIAGNOSIS — N950 Postmenopausal bleeding: Secondary | ICD-10-CM | POA: Diagnosis present

## 2015-04-19 DIAGNOSIS — F411 Generalized anxiety disorder: Secondary | ICD-10-CM | POA: Diagnosis not present

## 2015-04-19 DIAGNOSIS — Z781 Physical restraint status: Secondary | ICD-10-CM | POA: Diagnosis not present

## 2015-04-19 DIAGNOSIS — D649 Anemia, unspecified: Secondary | ICD-10-CM | POA: Diagnosis not present

## 2015-04-19 DIAGNOSIS — F3289 Other specified depressive episodes: Secondary | ICD-10-CM | POA: Diagnosis not present

## 2015-04-19 DIAGNOSIS — R1900 Intra-abdominal and pelvic swelling, mass and lump, unspecified site: Secondary | ICD-10-CM | POA: Diagnosis not present

## 2015-04-19 DIAGNOSIS — R4182 Altered mental status, unspecified: Secondary | ICD-10-CM | POA: Diagnosis not present

## 2015-04-19 DIAGNOSIS — R262 Difficulty in walking, not elsewhere classified: Secondary | ICD-10-CM | POA: Diagnosis not present

## 2015-04-19 DIAGNOSIS — Z5189 Encounter for other specified aftercare: Secondary | ICD-10-CM | POA: Diagnosis not present

## 2015-04-19 DIAGNOSIS — K219 Gastro-esophageal reflux disease without esophagitis: Secondary | ICD-10-CM | POA: Diagnosis present

## 2015-04-19 DIAGNOSIS — M6281 Muscle weakness (generalized): Secondary | ICD-10-CM | POA: Diagnosis not present

## 2015-04-19 DIAGNOSIS — R52 Pain, unspecified: Secondary | ICD-10-CM | POA: Diagnosis not present

## 2015-04-19 DIAGNOSIS — E876 Hypokalemia: Secondary | ICD-10-CM | POA: Diagnosis not present

## 2015-04-19 DIAGNOSIS — M6250 Muscle wasting and atrophy, not elsewhere classified, unspecified site: Secondary | ICD-10-CM | POA: Diagnosis not present

## 2015-04-19 DIAGNOSIS — R188 Other ascites: Secondary | ICD-10-CM | POA: Diagnosis present

## 2015-04-19 DIAGNOSIS — D500 Iron deficiency anemia secondary to blood loss (chronic): Secondary | ICD-10-CM | POA: Diagnosis present

## 2015-04-19 DIAGNOSIS — R451 Restlessness and agitation: Secondary | ICD-10-CM | POA: Diagnosis not present

## 2015-04-19 DIAGNOSIS — C562 Malignant neoplasm of left ovary: Secondary | ICD-10-CM | POA: Diagnosis present

## 2015-04-19 DIAGNOSIS — F05 Delirium due to known physiological condition: Secondary | ICD-10-CM | POA: Diagnosis not present

## 2015-04-19 HISTORY — PX: TOTAL ABDOMINAL HYSTERECTOMY W/ BILATERAL SALPINGOOPHORECTOMY: SHX83

## 2015-04-23 DIAGNOSIS — E876 Hypokalemia: Secondary | ICD-10-CM | POA: Insufficient documentation

## 2015-04-26 DIAGNOSIS — R262 Difficulty in walking, not elsewhere classified: Secondary | ICD-10-CM | POA: Diagnosis not present

## 2015-04-26 DIAGNOSIS — M6250 Muscle wasting and atrophy, not elsewhere classified, unspecified site: Secondary | ICD-10-CM | POA: Diagnosis not present

## 2015-04-26 DIAGNOSIS — D509 Iron deficiency anemia, unspecified: Secondary | ICD-10-CM | POA: Diagnosis not present

## 2015-04-26 DIAGNOSIS — R4182 Altered mental status, unspecified: Secondary | ICD-10-CM | POA: Diagnosis not present

## 2015-04-26 DIAGNOSIS — D519 Vitamin B12 deficiency anemia, unspecified: Secondary | ICD-10-CM | POA: Diagnosis not present

## 2015-04-26 DIAGNOSIS — R52 Pain, unspecified: Secondary | ICD-10-CM | POA: Diagnosis not present

## 2015-04-26 DIAGNOSIS — G894 Chronic pain syndrome: Secondary | ICD-10-CM | POA: Diagnosis not present

## 2015-04-26 DIAGNOSIS — D649 Anemia, unspecified: Secondary | ICD-10-CM | POA: Diagnosis not present

## 2015-04-26 DIAGNOSIS — M6281 Muscle weakness (generalized): Secondary | ICD-10-CM | POA: Diagnosis not present

## 2015-04-26 DIAGNOSIS — R2681 Unsteadiness on feet: Secondary | ICD-10-CM | POA: Diagnosis not present

## 2015-04-26 DIAGNOSIS — K219 Gastro-esophageal reflux disease without esophagitis: Secondary | ICD-10-CM | POA: Diagnosis not present

## 2015-04-26 DIAGNOSIS — M150 Primary generalized (osteo)arthritis: Secondary | ICD-10-CM | POA: Diagnosis not present

## 2015-04-26 DIAGNOSIS — F3289 Other specified depressive episodes: Secondary | ICD-10-CM | POA: Diagnosis not present

## 2015-04-26 DIAGNOSIS — I10 Essential (primary) hypertension: Secondary | ICD-10-CM | POA: Diagnosis not present

## 2015-04-26 DIAGNOSIS — R1900 Intra-abdominal and pelvic swelling, mass and lump, unspecified site: Secondary | ICD-10-CM | POA: Diagnosis not present

## 2015-04-26 DIAGNOSIS — Z5189 Encounter for other specified aftercare: Secondary | ICD-10-CM | POA: Diagnosis not present

## 2015-04-26 DIAGNOSIS — N939 Abnormal uterine and vaginal bleeding, unspecified: Secondary | ICD-10-CM | POA: Diagnosis not present

## 2015-04-29 DIAGNOSIS — D519 Vitamin B12 deficiency anemia, unspecified: Secondary | ICD-10-CM | POA: Diagnosis not present

## 2015-04-29 DIAGNOSIS — I10 Essential (primary) hypertension: Secondary | ICD-10-CM | POA: Diagnosis not present

## 2015-04-29 DIAGNOSIS — D509 Iron deficiency anemia, unspecified: Secondary | ICD-10-CM | POA: Diagnosis not present

## 2015-04-29 DIAGNOSIS — M150 Primary generalized (osteo)arthritis: Secondary | ICD-10-CM | POA: Diagnosis not present

## 2015-04-29 DIAGNOSIS — R2681 Unsteadiness on feet: Secondary | ICD-10-CM | POA: Diagnosis not present

## 2015-04-29 DIAGNOSIS — K219 Gastro-esophageal reflux disease without esophagitis: Secondary | ICD-10-CM | POA: Diagnosis not present

## 2015-04-29 DIAGNOSIS — N939 Abnormal uterine and vaginal bleeding, unspecified: Secondary | ICD-10-CM | POA: Diagnosis not present

## 2015-04-29 DIAGNOSIS — G894 Chronic pain syndrome: Secondary | ICD-10-CM | POA: Diagnosis not present

## 2015-05-09 DIAGNOSIS — D649 Anemia, unspecified: Secondary | ICD-10-CM | POA: Diagnosis not present

## 2015-05-09 DIAGNOSIS — E876 Hypokalemia: Secondary | ICD-10-CM | POA: Diagnosis not present

## 2015-05-09 DIAGNOSIS — F419 Anxiety disorder, unspecified: Secondary | ICD-10-CM | POA: Diagnosis not present

## 2015-05-09 DIAGNOSIS — H2512 Age-related nuclear cataract, left eye: Secondary | ICD-10-CM | POA: Diagnosis present

## 2015-05-09 DIAGNOSIS — D3912 Neoplasm of uncertain behavior of left ovary: Secondary | ICD-10-CM | POA: Diagnosis present

## 2015-05-09 DIAGNOSIS — J00 Acute nasopharyngitis [common cold]: Secondary | ICD-10-CM | POA: Diagnosis not present

## 2015-05-09 DIAGNOSIS — Z9841 Cataract extraction status, right eye: Secondary | ICD-10-CM | POA: Diagnosis not present

## 2015-05-09 DIAGNOSIS — K219 Gastro-esophageal reflux disease without esophagitis: Secondary | ICD-10-CM | POA: Diagnosis not present

## 2015-05-09 DIAGNOSIS — D500 Iron deficiency anemia secondary to blood loss (chronic): Secondary | ICD-10-CM | POA: Diagnosis not present

## 2015-05-09 DIAGNOSIS — E0590 Thyrotoxicosis, unspecified without thyrotoxic crisis or storm: Secondary | ICD-10-CM | POA: Diagnosis not present

## 2015-05-09 DIAGNOSIS — Z79899 Other long term (current) drug therapy: Secondary | ICD-10-CM | POA: Diagnosis not present

## 2015-05-09 DIAGNOSIS — K5792 Diverticulitis of intestine, part unspecified, without perforation or abscess without bleeding: Secondary | ICD-10-CM | POA: Diagnosis not present

## 2015-05-09 DIAGNOSIS — E870 Hyperosmolality and hypernatremia: Secondary | ICD-10-CM | POA: Diagnosis not present

## 2015-05-09 DIAGNOSIS — R51 Headache: Secondary | ICD-10-CM | POA: Diagnosis not present

## 2015-05-09 DIAGNOSIS — Z862 Personal history of diseases of the blood and blood-forming organs and certain disorders involving the immune mechanism: Secondary | ICD-10-CM | POA: Diagnosis not present

## 2015-05-09 DIAGNOSIS — R1900 Intra-abdominal and pelvic swelling, mass and lump, unspecified site: Secondary | ICD-10-CM | POA: Diagnosis not present

## 2015-05-09 DIAGNOSIS — F411 Generalized anxiety disorder: Secondary | ICD-10-CM | POA: Diagnosis not present

## 2015-05-09 DIAGNOSIS — Z90722 Acquired absence of ovaries, bilateral: Secondary | ICD-10-CM | POA: Diagnosis not present

## 2015-05-09 DIAGNOSIS — N838 Other noninflammatory disorders of ovary, fallopian tube and broad ligament: Secondary | ICD-10-CM | POA: Diagnosis not present

## 2015-05-09 DIAGNOSIS — Z6837 Body mass index (BMI) 37.0-37.9, adult: Secondary | ICD-10-CM | POA: Diagnosis not present

## 2015-05-09 DIAGNOSIS — E538 Deficiency of other specified B group vitamins: Secondary | ICD-10-CM | POA: Diagnosis not present

## 2015-05-09 DIAGNOSIS — N839 Noninflammatory disorder of ovary, fallopian tube and broad ligament, unspecified: Secondary | ICD-10-CM | POA: Diagnosis not present

## 2015-05-09 DIAGNOSIS — F329 Major depressive disorder, single episode, unspecified: Secondary | ICD-10-CM | POA: Diagnosis not present

## 2015-05-09 DIAGNOSIS — F320 Major depressive disorder, single episode, mild: Secondary | ICD-10-CM | POA: Diagnosis not present

## 2015-05-09 DIAGNOSIS — N840 Polyp of corpus uteri: Secondary | ICD-10-CM | POA: Diagnosis not present

## 2015-05-09 DIAGNOSIS — F0390 Unspecified dementia without behavioral disturbance: Secondary | ICD-10-CM | POA: Diagnosis not present

## 2015-05-09 DIAGNOSIS — H2511 Age-related nuclear cataract, right eye: Secondary | ICD-10-CM | POA: Diagnosis present

## 2015-05-09 DIAGNOSIS — R2681 Unsteadiness on feet: Secondary | ICD-10-CM | POA: Diagnosis not present

## 2015-05-09 DIAGNOSIS — F3289 Other specified depressive episodes: Secondary | ICD-10-CM | POA: Diagnosis not present

## 2015-05-09 DIAGNOSIS — Z8543 Personal history of malignant neoplasm of ovary: Secondary | ICD-10-CM | POA: Diagnosis not present

## 2015-05-09 DIAGNOSIS — D460 Refractory anemia without ring sideroblasts, so stated: Secondary | ICD-10-CM | POA: Diagnosis not present

## 2015-05-09 DIAGNOSIS — R32 Unspecified urinary incontinence: Secondary | ICD-10-CM | POA: Diagnosis not present

## 2015-05-09 DIAGNOSIS — K047 Periapical abscess without sinus: Secondary | ICD-10-CM | POA: Diagnosis not present

## 2015-05-09 DIAGNOSIS — Z90710 Acquired absence of both cervix and uterus: Secondary | ICD-10-CM | POA: Diagnosis not present

## 2015-05-09 DIAGNOSIS — F418 Other specified anxiety disorders: Secondary | ICD-10-CM | POA: Diagnosis not present

## 2015-05-09 DIAGNOSIS — I10 Essential (primary) hypertension: Secondary | ICD-10-CM | POA: Diagnosis not present

## 2015-05-17 DIAGNOSIS — N839 Noninflammatory disorder of ovary, fallopian tube and broad ligament, unspecified: Secondary | ICD-10-CM | POA: Diagnosis not present

## 2015-05-17 DIAGNOSIS — E876 Hypokalemia: Secondary | ICD-10-CM | POA: Diagnosis not present

## 2015-05-17 DIAGNOSIS — I10 Essential (primary) hypertension: Secondary | ICD-10-CM | POA: Diagnosis not present

## 2015-05-17 DIAGNOSIS — D500 Iron deficiency anemia secondary to blood loss (chronic): Secondary | ICD-10-CM | POA: Diagnosis not present

## 2015-05-24 DIAGNOSIS — D500 Iron deficiency anemia secondary to blood loss (chronic): Secondary | ICD-10-CM | POA: Diagnosis not present

## 2015-05-24 DIAGNOSIS — N838 Other noninflammatory disorders of ovary, fallopian tube and broad ligament: Secondary | ICD-10-CM | POA: Diagnosis not present

## 2015-05-24 DIAGNOSIS — I10 Essential (primary) hypertension: Secondary | ICD-10-CM | POA: Diagnosis not present

## 2015-05-25 ENCOUNTER — Ambulatory Visit: Payer: Medicare Other

## 2015-05-26 DIAGNOSIS — N838 Other noninflammatory disorders of ovary, fallopian tube and broad ligament: Secondary | ICD-10-CM | POA: Diagnosis not present

## 2015-06-07 DIAGNOSIS — K219 Gastro-esophageal reflux disease without esophagitis: Secondary | ICD-10-CM | POA: Diagnosis not present

## 2015-06-10 DIAGNOSIS — K047 Periapical abscess without sinus: Secondary | ICD-10-CM | POA: Diagnosis not present

## 2015-06-10 DIAGNOSIS — I10 Essential (primary) hypertension: Secondary | ICD-10-CM | POA: Diagnosis not present

## 2015-06-16 IMAGING — US US EXTREM LOW VENOUS*R*
1 series · 13 of 24 positions shown · non-contrast
Comparison: None.

CLINICAL DATA: Right lateral lower extremity pain, evaluate for DVT



[Series 1: us extrem low venous*right* · 13 of 40 slices shown]
[im 1/40]
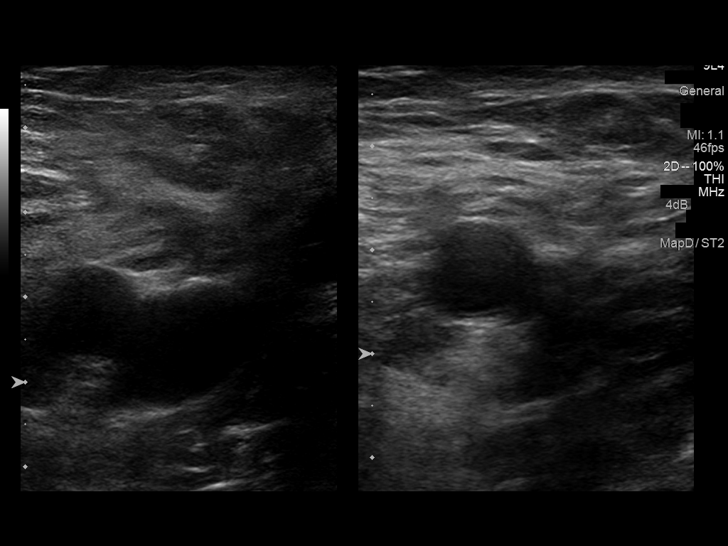
[im 4/40]
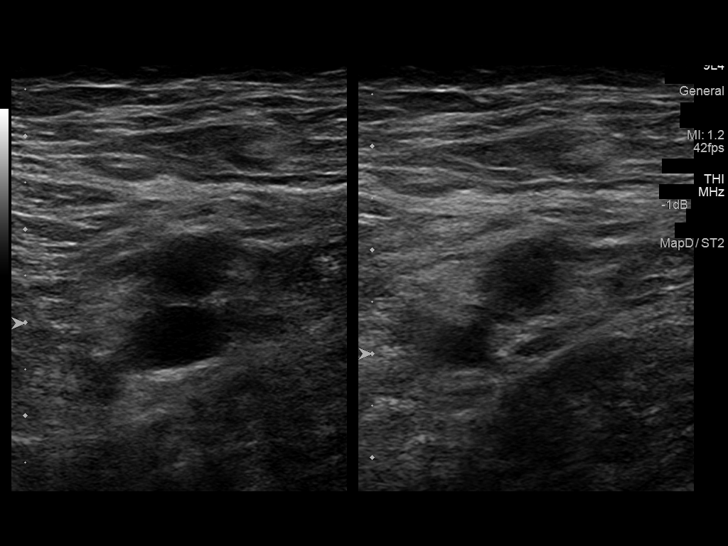
[im 7/40]
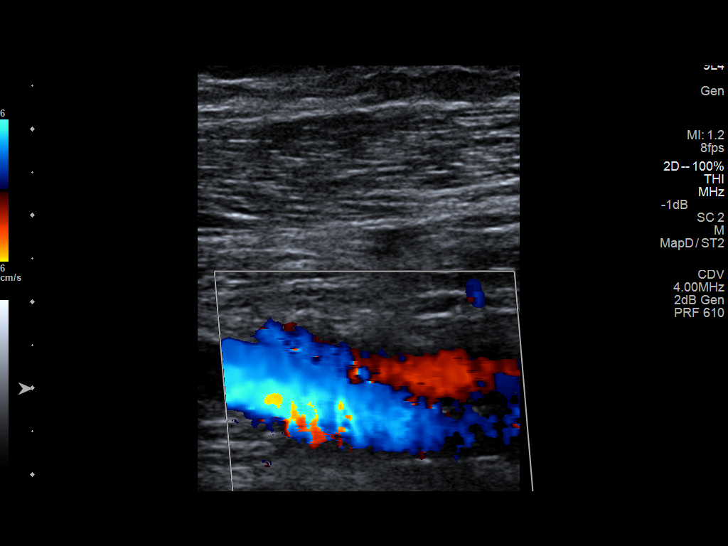
[im 11/40]
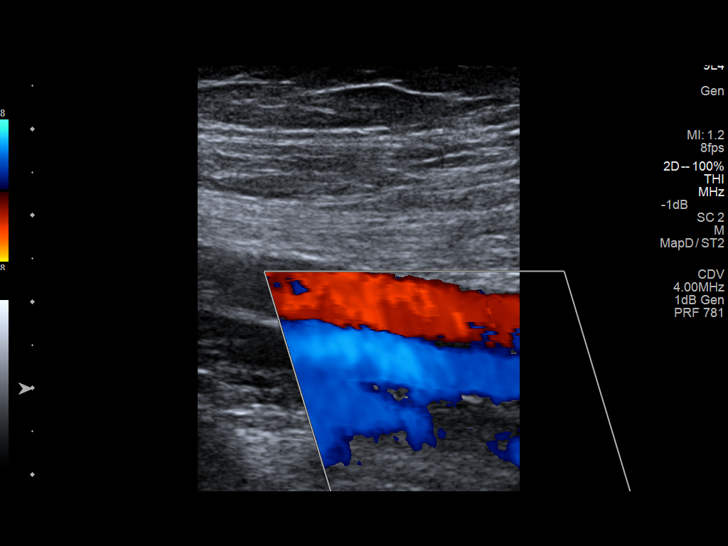
[im 14/40]
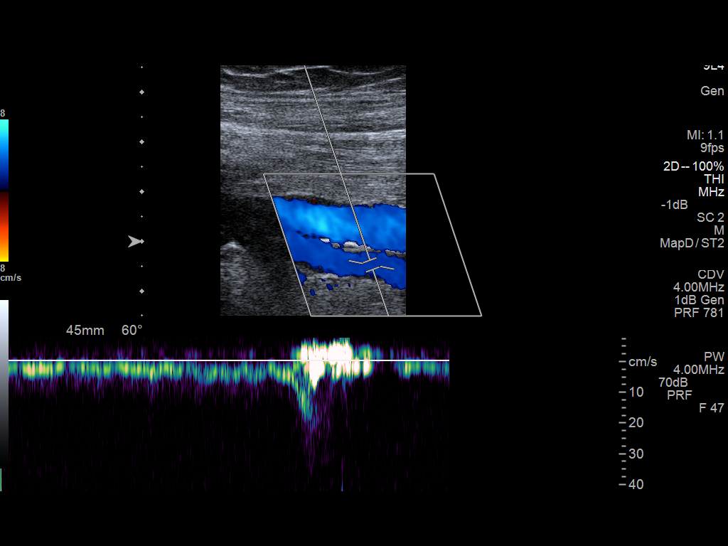
[im 17/40]
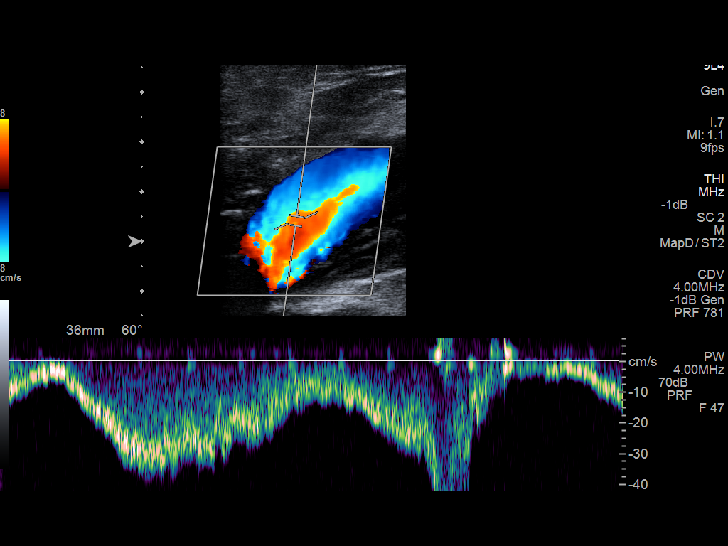
[im 21/40]
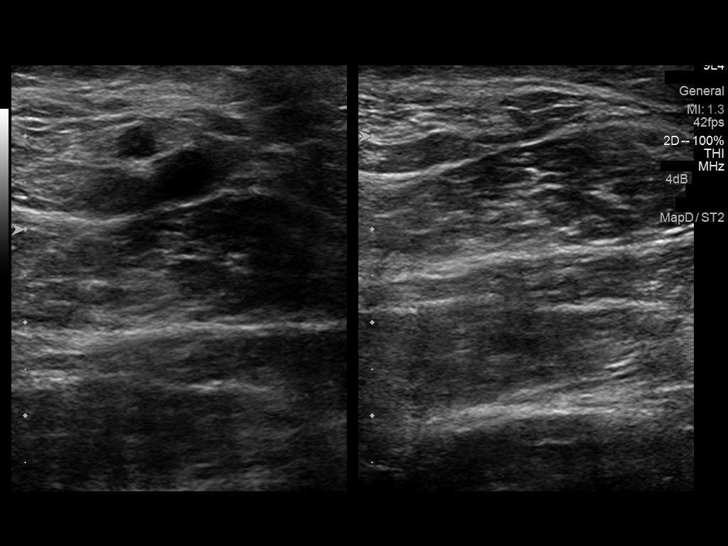
[im 23/40]
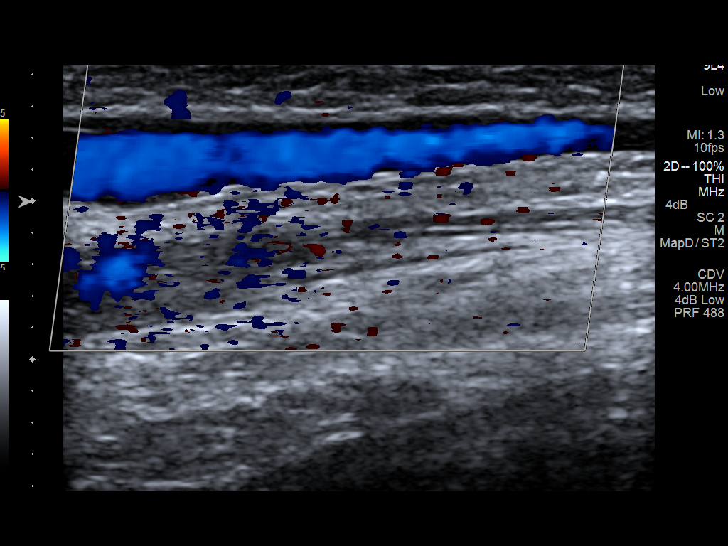
[im 26/40]
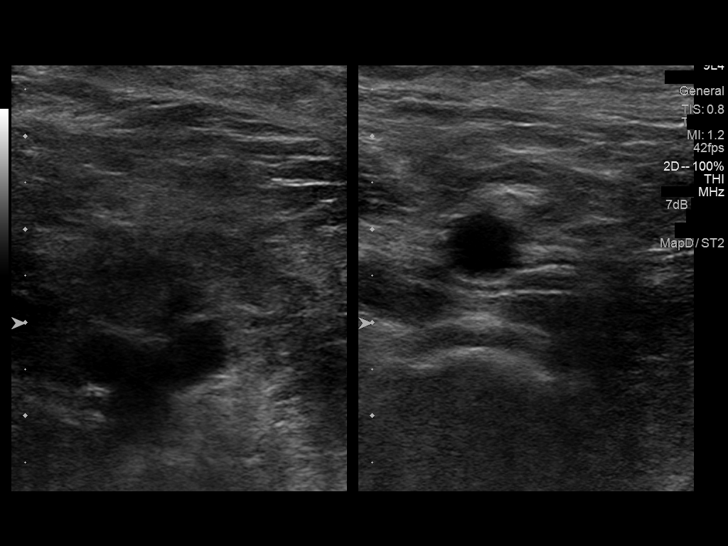
[im 29/40]
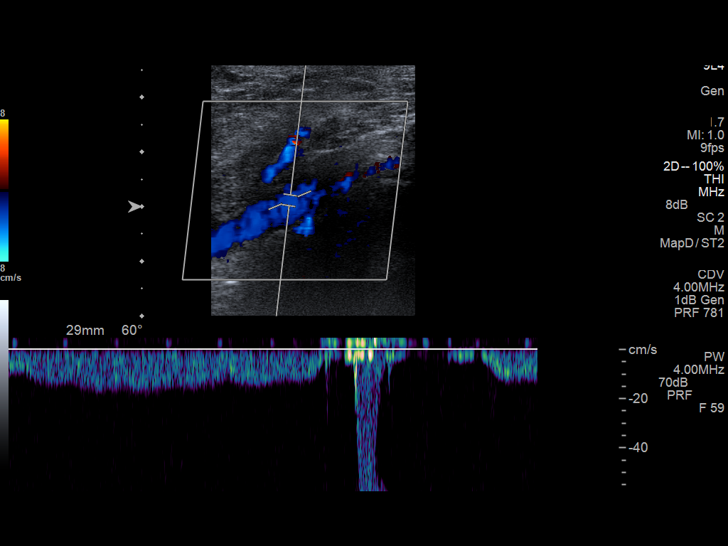
[im 33/40]
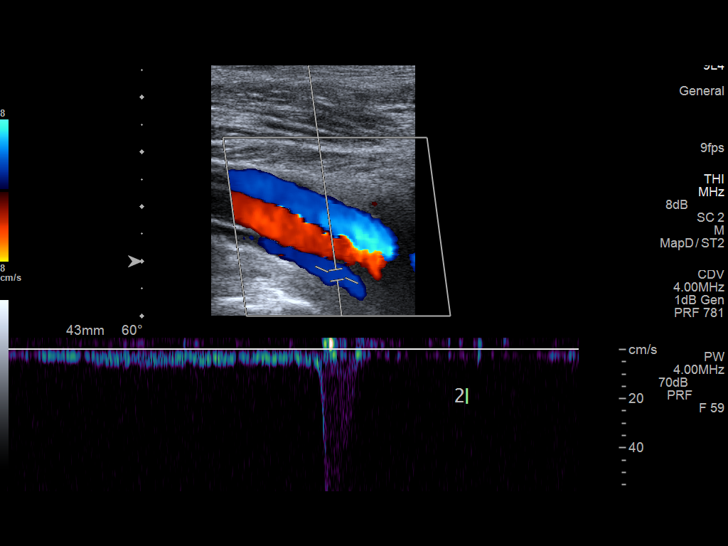
[im 36/40]
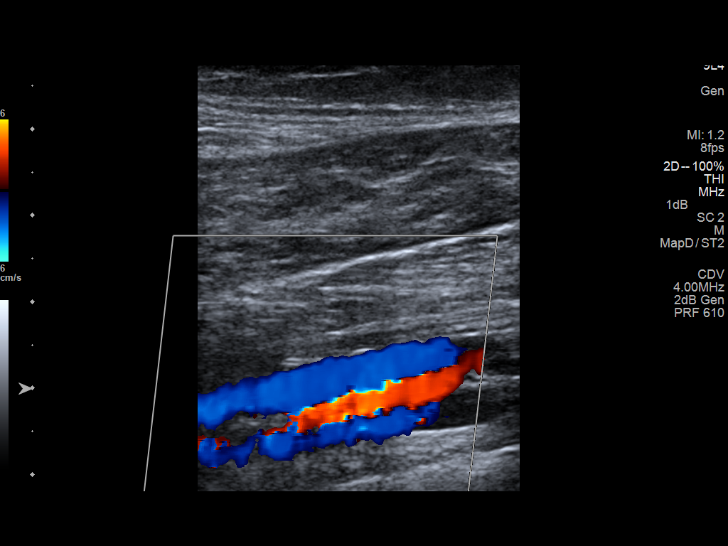
[im 40/40]
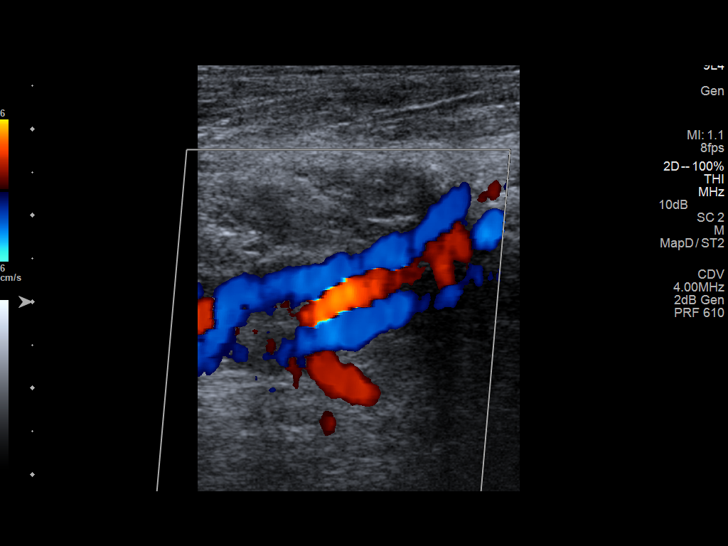

[13 of 24 positions shown; findings below may reference images not displayed]

FINDINGS: Common Femoral Vein: No evidence of thrombus. Normal
compressibility, respiratory phasicity and response to augmentation.

Saphenofemoral Junction: No evidence of thrombus. Normal
compressibility and flow on color Doppler imaging.

Profunda Femoral Vein: No evidence of thrombus. Normal
compressibility and flow on color Doppler imaging.

Femoral Vein: No evidence of thrombus. Normal compressibility,
respiratory phasicity and response to augmentation.

Popliteal Vein: No evidence of thrombus. Normal compressibility,
respiratory phasicity and response to augmentation.

Calf Veins: No evidence of thrombus. Normal compressibility and flow
on color Doppler imaging.

Superficial Great Saphenous Vein: No evidence of thrombus. Normal
compressibility and flow on color Doppler imaging.

Venous Reflux:  None.

Other Findings:  None.
IMPRESSION: No evidence of DVT within the right lower extremity.

## 2015-06-21 DIAGNOSIS — J00 Acute nasopharyngitis [common cold]: Secondary | ICD-10-CM | POA: Diagnosis not present

## 2015-07-01 DIAGNOSIS — F320 Major depressive disorder, single episode, mild: Secondary | ICD-10-CM | POA: Diagnosis not present

## 2015-07-19 ENCOUNTER — Encounter: Payer: Self-pay | Admitting: *Deleted

## 2015-07-26 ENCOUNTER — Ambulatory Visit: Payer: Medicare Other | Admitting: Anesthesiology

## 2015-07-26 ENCOUNTER — Encounter: Payer: Self-pay | Admitting: *Deleted

## 2015-07-26 ENCOUNTER — Ambulatory Visit
Admission: RE | Admit: 2015-07-26 | Discharge: 2015-07-26 | Disposition: A | Discharge status: Home / Self Care | Payer: Medicare Other | Source: Ambulatory Visit | Attending: Ophthalmology | Admitting: Ophthalmology

## 2015-07-26 ENCOUNTER — Encounter
Admission: RE | Disposition: A | Discharge status: Home / Self Care | Payer: Self-pay | Source: Ambulatory Visit | Attending: Ophthalmology

## 2015-07-26 DIAGNOSIS — F329 Major depressive disorder, single episode, unspecified: Secondary | ICD-10-CM | POA: Insufficient documentation

## 2015-07-26 DIAGNOSIS — H2511 Age-related nuclear cataract, right eye: Secondary | ICD-10-CM | POA: Diagnosis not present

## 2015-07-26 DIAGNOSIS — I10 Essential (primary) hypertension: Secondary | ICD-10-CM | POA: Insufficient documentation

## 2015-07-26 DIAGNOSIS — F419 Anxiety disorder, unspecified: Secondary | ICD-10-CM | POA: Insufficient documentation

## 2015-07-26 DIAGNOSIS — Z862 Personal history of diseases of the blood and blood-forming organs and certain disorders involving the immune mechanism: Secondary | ICD-10-CM | POA: Insufficient documentation

## 2015-07-26 HISTORY — DX: Depression, unspecified: F32.A

## 2015-07-26 HISTORY — DX: Hypomagnesemia: E83.42

## 2015-07-26 HISTORY — PX: CATARACT EXTRACTION W/PHACO: SHX586

## 2015-07-26 HISTORY — DX: Anxiety disorder, unspecified: F41.9

## 2015-07-26 HISTORY — DX: Deficiency of other specified B group vitamins: E53.8

## 2015-07-26 HISTORY — DX: Other abnormalities of gait and mobility: R26.89

## 2015-07-26 HISTORY — DX: Major depressive disorder, single episode, unspecified: F32.9

## 2015-07-26 HISTORY — DX: Unspecified dementia, unspecified severity, without behavioral disturbance, psychotic disturbance, mood disturbance, and anxiety: F03.90

## 2015-07-26 HISTORY — DX: Malignant (primary) neoplasm, unspecified: C80.1

## 2015-07-26 SURGERY — PHACOEMULSIFICATION, CATARACT, WITH IOL INSERTION
Anesthesia: General | Site: Eye | Laterality: Right | Wound class: Clean

## 2015-07-26 MED ORDER — TETRACAINE HCL 0.5 % OP SOLN
1.0000 [drp] | Freq: Once | OPHTHALMIC | Status: AC
  Administered 2015-07-26: 1 [drp] via OPHTHALMIC

## 2015-07-26 MED ORDER — EPINEPHRINE HCL 1 MG/ML IJ SOLN
INTRAOCULAR | Status: DC | PRN
  Administered 2015-07-26: 1 mL via OPHTHALMIC

## 2015-07-26 MED ORDER — MOXIFLOXACIN HCL 0.5 % OP SOLN
OPHTHALMIC | Status: AC
  Filled 2015-07-26: qty 3

## 2015-07-26 MED ORDER — LIDOCAINE HCL (PF) 4 % IJ SOLN
INTRAMUSCULAR | Status: AC
  Filled 2015-07-26: qty 5

## 2015-07-26 MED ORDER — ALFENTANIL 500 MCG/ML IJ INJ
INJECTION | INTRAMUSCULAR | Status: DC | PRN
  Administered 2015-07-26 (×2): 250 ug via INTRAVENOUS

## 2015-07-26 MED ORDER — CARBACHOL 0.01 % IO SOLN
INTRAOCULAR | Status: DC | PRN
  Administered 2015-07-26: .5 mL via INTRAOCULAR

## 2015-07-26 MED ORDER — EPINEPHRINE HCL 1 MG/ML IJ SOLN
INTRAMUSCULAR | Status: AC
  Filled 2015-07-26: qty 2

## 2015-07-26 MED ORDER — LIDOCAINE HCL (PF) 4 % IJ SOLN
INTRAOCULAR | Status: DC | PRN
  Administered 2015-07-26: .5 mL via OPHTHALMIC

## 2015-07-26 MED ORDER — MIDAZOLAM HCL 2 MG/2ML IJ SOLN
INTRAMUSCULAR | Status: DC | PRN
  Administered 2015-07-26 (×2): 0.5 mg via INTRAVENOUS

## 2015-07-26 MED ORDER — NA CHONDROIT SULF-NA HYALURON 40-17 MG/ML IO SOLN
INTRAOCULAR | Status: DC | PRN
  Administered 2015-07-26: 1 mL via INTRAOCULAR

## 2015-07-26 MED ORDER — MOXIFLOXACIN HCL 0.5 % OP SOLN
OPHTHALMIC | Status: DC | PRN
  Administered 2015-07-26: 1 [drp] via OPHTHALMIC

## 2015-07-26 MED ORDER — TETRACAINE HCL 0.5 % OP SOLN
OPHTHALMIC | Status: AC
  Filled 2015-07-26: qty 2

## 2015-07-26 MED ORDER — CEFUROXIME OPHTHALMIC INJECTION 1 MG/0.1 ML
INJECTION | OPHTHALMIC | Status: DC | PRN
Start: 2015-07-26 — End: 2015-07-26
  Administered 2015-07-26: .1 mL via INTRACAMERAL

## 2015-07-26 MED ORDER — POVIDONE-IODINE 5 % OP SOLN
OPHTHALMIC | Status: AC
  Filled 2015-07-26: qty 30

## 2015-07-26 MED ORDER — ARMC OPHTHALMIC DILATING GEL
1.0000 "application " | OPHTHALMIC | Status: DC | PRN
  Administered 2015-07-26: 1 via OPHTHALMIC

## 2015-07-26 MED ORDER — POVIDONE-IODINE 5 % OP SOLN
1.0000 "application " | Freq: Once | OPHTHALMIC | Status: AC
  Administered 2015-07-26: 1 via OPHTHALMIC

## 2015-07-26 MED ORDER — ARMC OPHTHALMIC DILATING GEL
OPHTHALMIC | Status: AC
  Filled 2015-07-26: qty 0.25

## 2015-07-26 MED ORDER — NA CHONDROIT SULF-NA HYALURON 40-17 MG/ML IO SOLN
INTRAOCULAR | Status: AC
  Filled 2015-07-26: qty 1

## 2015-07-26 MED ORDER — CEFUROXIME OPHTHALMIC INJECTION 1 MG/0.1 ML
INJECTION | OPHTHALMIC | Status: AC
  Filled 2015-07-26: qty 0.1

## 2015-07-26 MED ORDER — SODIUM CHLORIDE 0.9 % IV SOLN
INTRAVENOUS | Status: DC
  Administered 2015-07-26: 08:00:00 via INTRAVENOUS

## 2015-07-26 MED ORDER — PHENYLEPHRINE HCL 10 % OP SOLN
OPHTHALMIC | Status: AC
  Filled 2015-07-26: qty 5

## 2015-07-26 MED ORDER — MOXIFLOXACIN HCL 0.5 % OP SOLN: 1.0000 [drp] | OPHTHALMIC | Status: DC | PRN

## 2015-07-26 SURGICAL SUPPLY — 21 items
CANNULA ANT/CHMB 27GA (MISCELLANEOUS) ×3 IMPLANT
CUP MEDICINE 2OZ PLAST GRAD ST (MISCELLANEOUS) ×3 IMPLANT
GLOVE BIO SURGEON STRL SZ8 (GLOVE) ×3 IMPLANT
GLOVE BIOGEL M 6.5 STRL (GLOVE) ×3 IMPLANT
GLOVE SURG LX 8.0 MICRO (GLOVE) ×2
GLOVE SURG LX STRL 8.0 MICRO (GLOVE) ×1 IMPLANT
GOWN STRL REUS W/ TWL LRG LVL3 (GOWN DISPOSABLE) ×2 IMPLANT
GOWN STRL REUS W/TWL LRG LVL3 (GOWN DISPOSABLE) ×4
LENS IOL TECNIS ITEC 19.5 (Intraocular Lens) ×3 IMPLANT
PACK CATARACT (MISCELLANEOUS) ×3 IMPLANT
PACK CATARACT BRASINGTON LX (MISCELLANEOUS) ×3 IMPLANT
PACK EYE AFTER SURG (MISCELLANEOUS) ×3 IMPLANT
SOL BSS BAG (MISCELLANEOUS) ×3
SOL PREP PVP 2OZ (MISCELLANEOUS) ×3
SOLUTION BSS BAG (MISCELLANEOUS) ×1 IMPLANT
SOLUTION PREP PVP 2OZ (MISCELLANEOUS) ×1 IMPLANT
SYR 3ML LL SCALE MARK (SYRINGE) ×3 IMPLANT
SYR 5ML LL (SYRINGE) ×3 IMPLANT
SYR TB 1ML 27GX1/2 LL (SYRINGE) ×3 IMPLANT
WATER STERILE IRR 1000ML POUR (IV SOLUTION) ×3 IMPLANT
WIPE NON LINTING 3.25X3.25 (MISCELLANEOUS) ×3 IMPLANT

## 2015-07-26 NOTE — Discharge Instructions (Signed)
Eye Surgery Discharge Instructions  Expect mild scratchy sensation or mild soreness. DO NOT RUB YOUR EYE!  The day of surgery:  Minimal physical activity, but bed rest is not required  No reading, computer work, or close hand work  No bending, lifting, or straining.  May watch TV  For 24 hours:  No driving, legal decisions, or alcoholic beverages  Safety precautions  Eat anything you prefer: It is better to start with liquids, then soup then solid foods.  _____ Eye patch should be worn until postoperative exam tomorrow.  ____ Solar shield eyeglasses should be worn for comfort in the sunlight/patch while sleeping  Resume all regular medications including aspirin or Coumadin if these were discontinued prior to surgery. You may shower, bathe, shave, or wash your hair. Tylenol may be taken for mild discomfort.  Call your doctor if you experience significant pain, nausea, or vomiting, fever > 101 or other signs of infection. [phone] or [phone] Specific instructions:  Follow-up Information    Follow up with Tim Lair, MD On 07/27/2015.   Specialty:  Ophthalmology   Why:  10:00   Contact information:   [address] [place] [address] [phone]

## 2015-07-26 NOTE — H&P (Signed)
  All labs reviewed. Abnormal studies sent to patients PCP when indicated.  Previous H&P reviewed, patient examined, there are NO CHANGES.  [name] LOUIS6/27/20178:14 AM

## 2015-07-26 NOTE — Op Note (Signed)
PREOPERATIVE DIAGNOSIS:  Nuclear sclerotic cataract of the right eye.   POSTOPERATIVE DIAGNOSIS: nuclear sclerotic cataract right eye   OPERATIVE PROCEDURE:  Procedure(s): CATARACT EXTRACTION PHACO AND INTRAOCULAR LENS PLACEMENT (IOC)   SURGEON:  Birder Robson, MD.   ANESTHESIA:  Anesthesiologist: Gunnar Bulla, MD CRNA: Courtney Paris, CRNA  1.      Managed anesthesia care. 2.      Topical tetracaine drops followed by 2% Xylocaine jelly applied in the preoperative holding area.       3.     0.2 ml of epi-Shugarcaine was  placed in the anterior chamber following the paracentesis.    COMPLICATIONS:  None.   TECHNIQUE:   Stop and chop   DESCRIPTION OF PROCEDURE:  The patient was examined and consented in the preoperative holding area where the aforementioned topical anesthesia was applied to the right eye and then brought back to the Operating Room where the right eye was prepped and draped in the usual sterile ophthalmic fashion and a lid speculum was placed. A paracentesis was created with the side port blade and the anterior chamber was filled with viscoelastic. A near clear corneal incision was performed with the steel keratome. A continuous curvilinear capsulorrhexis was performed with a cystotome followed by the capsulorrhexis forceps. Hydrodissection and hydrodelineation were carried out with BSS on a blunt cannula. The lens was removed in a stop and chop  technique and the remaining cortical material was removed with the irrigation-aspiration handpiece. The capsular bag was inflated with viscoelastic and the Technis [id number]  lens was placed in the capsular bag without complication. The remaining viscoelastic was removed from the eye with the irrigation-aspiration handpiece. The wounds were hydrated. The anterior chamber was flushed with Miostat and the eye was inflated to physiologic pressure. 0.1 mL of cefuroxime concentration 10 mg/mL was placed in the anterior chamber. The wounds  were found to be water tight. The eye was dressed with Vigamox. The patient was given protective glasses to wear throughout the day and a shield with which to sleep tonight. The patient was also given drops with which to begin a drop regimen today and will follow-up with me in one day.  Implant Name Type Inv. Item Serial No. Manufacturer Lot No. LRB No. Used  tecnis aspheric IOL     [id number] [facility]   Right 1   Procedure(s) with comments: CATARACT EXTRACTION PHACO AND INTRAOCULAR LENS PLACEMENT (IOC) (Right) - [name]   1:03.5 AP%  23% CDE   14.59 fluid pack lot # [id number]  Electronically signed: [place] 07/26/2015 8:56 AM

## 2015-07-26 NOTE — Anesthesia Preprocedure Evaluation (Signed)
Anesthesia Evaluation  Patient identified by MRN, date of birth, ID band Patient awake    Reviewed: Allergy & Precautions, NPO status , Patient's Chart, lab work & pertinent test results, reviewed documented beta blocker date and time   Airway Mallampati: II  TM Distance: >3 FB     Dental  (+) Chipped   Pulmonary           Cardiovascular hypertension, Pt. on medications      Neuro/Psych  Headaches, PSYCHIATRIC DISORDERS Anxiety Depression    GI/Hepatic GERD  Controlled,  Endo/Other    Renal/GU      Musculoskeletal   Abdominal   Peds  Hematology  (+) anemia ,   Anesthesia Other Findings   Reproductive/Obstetrics                             Anesthesia Physical Anesthesia Plan  ASA: II  Anesthesia Plan: General   Post-op Pain Management:    Induction: Intravenous  Airway Management Planned: Oral ETT  Additional Equipment:   Intra-op Plan:   Post-operative Plan:   Informed Consent: I have reviewed the patients History and Physical, chart, labs and discussed the procedure including the risks, benefits and alternatives for the proposed anesthesia with the patient or authorized representative who has indicated his/her understanding and acceptance.     Plan Discussed with: CRNA  Anesthesia Plan Comments:         Anesthesia Quick Evaluation

## 2015-07-26 NOTE — Anesthesia Postprocedure Evaluation (Signed)
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  Procedure(s) Performed: Procedure(s) (LRB): CATARACT EXTRACTION PHACO AND INTRAOCULAR LENS PLACEMENT (IOC) (Right)  Patient location during evaluation: Other Anesthesia Type: General Level of consciousness: awake and alert Pain management: pain level controlled Vital Signs Assessment: post-procedure vital signs reviewed and stable Respiratory status: spontaneous breathing, nonlabored ventilation, respiratory function stable and patient connected to nasal cannula oxygen Cardiovascular status: blood pressure returned to baseline and stable Postop Assessment: no signs of nausea or vomiting Anesthetic complications: no    Last Vitals:  Filed Vitals:   07/26/15 0900 07/26/15 0912  BP:  158/92  Pulse: 69 70  Temp: 36.8 C   Resp:  16    Last Pain: There were no vitals filed for this visit.               Laycee Fitzsimmons S

## 2015-07-26 NOTE — Transfer of Care (Signed)
Immediate Anesthesia Transfer of Care Note  Patient: [name]  Procedure(s) Performed: Procedure(s) with comments: CATARACT EXTRACTION PHACO AND INTRAOCULAR LENS PLACEMENT (IOC) (Right) - [name]   1:03.5 AP%  23% CDE   14.59 fluid pack lot # [id number]  Patient Location: PACU and Short Stay  Anesthesia Type:MAC  Level of Consciousness: awake, oriented and patient cooperative  Airway & Oxygen Therapy: Patient Spontanous Breathing and Patient connected to nasal cannula oxygen  Post-op Assessment: Report given to RN and Post -op Vital signs reviewed and stable  Post vital signs: stable  Last Vitals:  Filed Vitals:   07/26/15 0735  BP: 135/68  Pulse: 68  Temp: 36.8 C  Resp: 16    Last Pain: There were no vitals filed for this visit.       Complications: No apparent anesthesia complications

## 2015-08-18 ENCOUNTER — Encounter: Payer: Self-pay | Admitting: *Deleted

## 2015-08-29 ENCOUNTER — Ambulatory Visit: Payer: Medicare Other | Admitting: Certified Registered"

## 2015-08-29 ENCOUNTER — Encounter: Payer: Self-pay | Admitting: *Deleted

## 2015-08-29 ENCOUNTER — Encounter
Admission: RE | Disposition: A | Discharge status: Home / Self Care | Payer: Self-pay | Source: Ambulatory Visit | Attending: Ophthalmology

## 2015-08-29 ENCOUNTER — Ambulatory Visit
Admission: RE | Admit: 2015-08-29 | Discharge: 2015-08-29 | Disposition: A | Discharge status: Home / Self Care | Payer: Medicare Other | Source: Ambulatory Visit | Attending: Ophthalmology | Admitting: Ophthalmology

## 2015-08-29 DIAGNOSIS — F329 Major depressive disorder, single episode, unspecified: Secondary | ICD-10-CM | POA: Insufficient documentation

## 2015-08-29 DIAGNOSIS — Z8543 Personal history of malignant neoplasm of ovary: Secondary | ICD-10-CM | POA: Insufficient documentation

## 2015-08-29 DIAGNOSIS — K5792 Diverticulitis of intestine, part unspecified, without perforation or abscess without bleeding: Secondary | ICD-10-CM | POA: Insufficient documentation

## 2015-08-29 DIAGNOSIS — R51 Headache: Secondary | ICD-10-CM | POA: Insufficient documentation

## 2015-08-29 DIAGNOSIS — H2512 Age-related nuclear cataract, left eye: Secondary | ICD-10-CM | POA: Diagnosis not present

## 2015-08-29 DIAGNOSIS — F0390 Unspecified dementia without behavioral disturbance: Secondary | ICD-10-CM | POA: Insufficient documentation

## 2015-08-29 DIAGNOSIS — R2681 Unsteadiness on feet: Secondary | ICD-10-CM | POA: Insufficient documentation

## 2015-08-29 DIAGNOSIS — F419 Anxiety disorder, unspecified: Secondary | ICD-10-CM | POA: Insufficient documentation

## 2015-08-29 DIAGNOSIS — D649 Anemia, unspecified: Secondary | ICD-10-CM | POA: Insufficient documentation

## 2015-08-29 DIAGNOSIS — F418 Other specified anxiety disorders: Secondary | ICD-10-CM | POA: Insufficient documentation

## 2015-08-29 DIAGNOSIS — Z90710 Acquired absence of both cervix and uterus: Secondary | ICD-10-CM | POA: Insufficient documentation

## 2015-08-29 DIAGNOSIS — I10 Essential (primary) hypertension: Secondary | ICD-10-CM | POA: Insufficient documentation

## 2015-08-29 DIAGNOSIS — K219 Gastro-esophageal reflux disease without esophagitis: Secondary | ICD-10-CM | POA: Insufficient documentation

## 2015-08-29 DIAGNOSIS — E538 Deficiency of other specified B group vitamins: Secondary | ICD-10-CM | POA: Insufficient documentation

## 2015-08-29 DIAGNOSIS — Z9841 Cataract extraction status, right eye: Secondary | ICD-10-CM | POA: Insufficient documentation

## 2015-08-29 HISTORY — PX: CATARACT EXTRACTION W/PHACO: SHX586

## 2015-08-29 SURGERY — PHACOEMULSIFICATION, CATARACT, WITH IOL INSERTION
Anesthesia: Monitor Anesthesia Care | Site: Eye | Laterality: Left | Wound class: Clean

## 2015-08-29 MED ORDER — NA CHONDROIT SULF-NA HYALURON 40-17 MG/ML IO SOLN
INTRAOCULAR | Status: AC
  Filled 2015-08-29: qty 1

## 2015-08-29 MED ORDER — LIDOCAINE HCL (PF) 4 % IJ SOLN
INTRAOCULAR | Status: DC | PRN
  Administered 2015-08-29: .5 mL via OPHTHALMIC

## 2015-08-29 MED ORDER — EPINEPHRINE HCL 1 MG/ML IJ SOLN
INTRAMUSCULAR | Status: AC
  Filled 2015-08-29: qty 2

## 2015-08-29 MED ORDER — TETRACAINE HCL 0.5 % OP SOLN
OPHTHALMIC | Status: AC
  Filled 2015-08-29: qty 2

## 2015-08-29 MED ORDER — FENTANYL CITRATE (PF) 100 MCG/2ML IJ SOLN
INTRAMUSCULAR | Status: DC | PRN
  Administered 2015-08-29: 50 ug via INTRAVENOUS

## 2015-08-29 MED ORDER — NA CHONDROIT SULF-NA HYALURON 40-17 MG/ML IO SOLN
INTRAOCULAR | Status: DC | PRN
  Administered 2015-08-29: 1 mL via INTRAOCULAR

## 2015-08-29 MED ORDER — MOXIFLOXACIN HCL 0.5 % OP SOLN
OPHTHALMIC | Status: DC | PRN
  Administered 2015-08-29: 1 [drp] via OPHTHALMIC

## 2015-08-29 MED ORDER — CEFUROXIME OPHTHALMIC INJECTION 1 MG/0.1 ML
INJECTION | OPHTHALMIC | Status: AC
  Filled 2015-08-29: qty 0.1

## 2015-08-29 MED ORDER — MIDAZOLAM HCL 2 MG/2ML IJ SOLN
INTRAMUSCULAR | Status: DC | PRN
Start: 2015-08-29 — End: 2015-08-29
  Administered 2015-08-29 (×2): 1 mg via INTRAVENOUS

## 2015-08-29 MED ORDER — CEFUROXIME OPHTHALMIC INJECTION 1 MG/0.1 ML
INJECTION | OPHTHALMIC | Status: DC | PRN
  Administered 2015-08-29: .1 mL via INTRACAMERAL

## 2015-08-29 MED ORDER — MOXIFLOXACIN HCL 0.5 % OP SOLN: 1.0000 [drp] | OPHTHALMIC | Status: DC | PRN

## 2015-08-29 MED ORDER — POVIDONE-IODINE 5 % OP SOLN
1.0000 "application " | Freq: Once | OPHTHALMIC | Status: AC
  Administered 2015-08-29: 1 via OPHTHALMIC

## 2015-08-29 MED ORDER — POVIDONE-IODINE 5 % OP SOLN
OPHTHALMIC | Status: AC
  Filled 2015-08-29: qty 30

## 2015-08-29 MED ORDER — MOXIFLOXACIN HCL 0.5 % OP SOLN
OPHTHALMIC | Status: AC
  Filled 2015-08-29: qty 3

## 2015-08-29 MED ORDER — SODIUM CHLORIDE 0.9 % IV SOLN
INTRAVENOUS | Status: DC
  Administered 2015-08-29: 07:00:00 via INTRAVENOUS

## 2015-08-29 MED ORDER — CARBACHOL 0.01 % IO SOLN
INTRAOCULAR | Status: DC | PRN
  Administered 2015-08-29: .5 mL via INTRAOCULAR

## 2015-08-29 MED ORDER — TETRACAINE HCL 0.5 % OP SOLN
1.0000 [drp] | Freq: Once | OPHTHALMIC | Status: AC
  Administered 2015-08-29: 1 [drp] via OPHTHALMIC

## 2015-08-29 MED ORDER — ARMC OPHTHALMIC DILATING GEL
1.0000 "application " | OPHTHALMIC | Status: AC | PRN
  Administered 2015-08-29 (×2): 1 via OPHTHALMIC

## 2015-08-29 MED ORDER — EPINEPHRINE HCL 1 MG/ML IJ SOLN
INTRAOCULAR | Status: DC | PRN
  Administered 2015-08-29: 1 mL via OPHTHALMIC

## 2015-08-29 SURGICAL SUPPLY — 21 items
CANNULA ANT/CHMB 27GA (MISCELLANEOUS) ×3 IMPLANT
CUP MEDICINE 2OZ PLAST GRAD ST (MISCELLANEOUS) ×3 IMPLANT
GLOVE BIO SURGEON STRL SZ8 (GLOVE) ×3 IMPLANT
GLOVE BIOGEL M 6.5 STRL (GLOVE) ×3 IMPLANT
GLOVE SURG LX 8.0 MICRO (GLOVE) ×2
GLOVE SURG LX STRL 8.0 MICRO (GLOVE) ×1 IMPLANT
GOWN STRL REUS W/ TWL LRG LVL3 (GOWN DISPOSABLE) ×2 IMPLANT
GOWN STRL REUS W/TWL LRG LVL3 (GOWN DISPOSABLE) ×6
LENS IOL TECNIS ITEC 19.5 (Intraocular Lens) ×3 IMPLANT
PACK CATARACT (MISCELLANEOUS) ×3 IMPLANT
PACK CATARACT BRASINGTON LX (MISCELLANEOUS) ×3 IMPLANT
PACK EYE AFTER SURG (MISCELLANEOUS) ×3 IMPLANT
SOL BSS BAG (MISCELLANEOUS) ×3
SOL PREP PVP 2OZ (MISCELLANEOUS) ×3
SOLUTION BSS BAG (MISCELLANEOUS) ×1 IMPLANT
SOLUTION PREP PVP 2OZ (MISCELLANEOUS) ×1 IMPLANT
SYR 3ML LL SCALE MARK (SYRINGE) ×3 IMPLANT
SYR 5ML LL (SYRINGE) ×3 IMPLANT
SYR TB 1ML 27GX1/2 LL (SYRINGE) ×3 IMPLANT
WATER STERILE IRR 1000ML POUR (IV SOLUTION) ×3 IMPLANT
WIPE NON LINTING 3.25X3.25 (MISCELLANEOUS) ×3 IMPLANT

## 2015-08-29 NOTE — Anesthesia Preprocedure Evaluation (Signed)
Anesthesia Evaluation  Patient identified by MRN, date of birth, ID band Patient awake    Reviewed: Allergy & Precautions, NPO status , Patient's Chart, lab work & pertinent test results, reviewed documented beta blocker date and time   Airway Mallampati: III  TM Distance: >3 FB     Dental  (+) Chipped   Pulmonary           Cardiovascular hypertension,      Neuro/Psych  Headaches, PSYCHIATRIC DISORDERS Anxiety Depression    GI/Hepatic GERD  Controlled,  Endo/Other    Renal/GU      Musculoskeletal   Abdominal   Peds  Hematology  (+) anemia ,   Anesthesia Other Findings   Reproductive/Obstetrics                             Anesthesia Physical Anesthesia Plan  ASA: III  Anesthesia Plan: MAC   Post-op Pain Management:    Induction:   Airway Management Planned:   Additional Equipment:   Intra-op Plan:   Post-operative Plan:   Informed Consent: I have reviewed the patients History and Physical, chart, labs and discussed the procedure including the risks, benefits and alternatives for the proposed anesthesia with the patient or authorized representative who has indicated his/her understanding and acceptance.     Plan Discussed with: CRNA  Anesthesia Plan Comments:         Anesthesia Quick Evaluation

## 2015-08-29 NOTE — Transfer of Care (Signed)
Immediate Anesthesia Transfer of Care Note  Patient: [name]  Procedure(s) Performed: Procedure(s) with comments: CATARACT EXTRACTION PHACO AND INTRAOCULAR LENS PLACEMENT (IOC) (Left) - [name] 00:40 AP% 20.9 CDE 8.50 fluid pack lot # [id number]  Patient Location: PACU  Anesthesia Type:MAC  Level of Consciousness: awake, alert  and oriented  Airway & Oxygen Therapy: Patient Spontanous Breathing  Post-op Assessment: Report given to RN and Post -op Vital signs reviewed and stable  Post vital signs: Reviewed and stable  Last Vitals:  Vitals:   08/29/15 0642 08/29/15 0820  BP: 134/86 (!) 153/84  Pulse: 72 71  Resp: 18 13  Temp: (!) 36.1 C     Last Pain:  Vitals:   08/29/15 0820  TempSrc: Tympanic         Complications: No apparent anesthesia complications

## 2015-08-29 NOTE — Op Note (Signed)
PREOPERATIVE DIAGNOSIS:  Nuclear sclerotic cataract of the left eye.   POSTOPERATIVE DIAGNOSIS:  nuclear sclerotic cataract left eye   OPERATIVE PROCEDURE:  Procedure(s): CATARACT EXTRACTION PHACO AND INTRAOCULAR LENS PLACEMENT (IOC)   SURGEON:  Birder Robson, MD.   ANESTHESIA:   Anesthesiologist: Gunnar Bulla, MD CRNA: Lance Muss, CRNA  1.      Managed anesthesia care. 2.      Topical tetracaine drops followed by 2% Xylocaine jelly applied in the preoperative holding area.        3.  PREOPERATIVE DIAGNOSIS:  Nuclear sclerotic cataract of the left eye.   POSTOPERATIVE DIAGNOSIS:  nuclear sclerotic cataract left eye   OPERATIVE PROCEDURE:  Procedure(s): CATARACT EXTRACTION PHACO AND INTRAOCULAR LENS PLACEMENT (IOC)   SURGEON:  Birder Robson, MD.   ANESTHESIA:   Anesthesiologist: Gunnar Bulla, MD CRNA: Lance Muss, CRNA  1.      Managed anesthesia care. 2.      Topical tetracaine drops followed by 2% Xylocaine jelly applied in the preoperative holding area.      3. 0.2 ml of epi-Shugarcaine was  placed in the anterior chamber following the paracentesis.     COMPLICATIONS:  None.   TECHNIQUE:   Stop and chop   DESCRIPTION OF PROCEDURE:  The patient was examined and consented in the preoperative holding area where the aforementioned topical anesthesia was applied to the left eye and then brought back to the Operating Room where the left eye was prepped and draped in the usual sterile ophthalmic fashion and a lid speculum was placed. A paracentesis was created with the side port blade and the anterior chamber was filled with viscoelastic. A near clear corneal incision was performed with the steel keratome. A continuous curvilinear capsulorrhexis was performed with a cystotome followed by the capsulorrhexis forceps. Hydrodissection and hydrodelineation were carried out with BSS on a blunt cannula. The lens was removed in a stop and chop  technique and the remaining cortical  material was removed with the irrigation-aspiration handpiece. The capsular bag was inflated with viscoelastic and the Technis ZCB00 lens was placed in the capsular bag without complication. The remaining viscoelastic was removed from the eye with the irrigation-aspiration handpiece. The wounds were hydrated. The anterior chamber was flushed with Miostat and the eye was inflated to physiologic pressure. 0.1 mL of cefuroxime concentration 10 mg/mL was placed in the anterior chamber. The wounds were found to be water tight. The eye was dressed with Vigamox. The patient was given protective glasses to wear throughout the day and a shield with which to sleep tonight. The patient was also given drops with which to begin a drop regimen today and will follow-up with me in one day.  Implant Name Type Inv. Item Serial No. Manufacturer Lot No. LRB No. Used  LENS IOL DIOP 19.5 - [id number] Intraocular Lens LENS IOL DIOP 19.5 [id number]   Left 1   Procedure(s) with comments: CATARACT EXTRACTION PHACO AND INTRAOCULAR LENS PLACEMENT (IOC) (Left) - [name] 00:40 AP% 20.9 CDE 8.50 fluid pack lot # [id number]  Electronically signed: [place] 08/29/2015 [id number]     COMPLICATIONS:  None.   TECHNIQUE:   Stop and chop   DESCRIPTION OF PROCEDURE:  The patient was examined and consented in the preoperative holding area where the aforementioned topical anesthesia was applied to the left eye and then brought back to the Operating Room where the left eye was prepped and draped in the usual sterile ophthalmic fashion and a lid speculum  was placed. A paracentesis was created with the side port blade and the anterior chamber was filled with viscoelastic. A near clear corneal incision was performed with the steel keratome. A continuous curvilinear capsulorrhexis was performed with a cystotome followed by the capsulorrhexis forceps. Hydrodissection and hydrodelineation were carried out with BSS on a blunt cannula.  The lens was removed in a stop and chop  technique and the remaining cortical material was removed with the irrigation-aspiration handpiece. The capsular bag was inflated with viscoelastic and the Technis ZCB00 lens was placed in the capsular bag without complication. The remaining viscoelastic was removed from the eye with the irrigation-aspiration handpiece. The wounds were hydrated. The anterior chamber was flushed with Miostat and the eye was inflated to physiologic pressure. 0.1 mL of cefuroxime concentration 10 mg/mL was placed in the anterior chamber. The wounds were found to be water tight. The eye was dressed with Vigamox. The patient was given protective glasses to wear throughout the day and a shield with which to sleep tonight. The patient was also given drops with which to begin a drop regimen today and will follow-up with me in one day.  Implant Name Type Inv. Item Serial No. Manufacturer Lot No. LRB No. Used  LENS IOL DIOP 19.5 - [id number] Intraocular Lens LENS IOL DIOP 19.5 [id number]   Left 1   Procedure(s) with comments: CATARACT EXTRACTION PHACO AND INTRAOCULAR LENS PLACEMENT (IOC) (Left) - [name] 00:40 AP% 20.9 CDE 8.50 fluid pack lot # [id number]  Electronically signed: [place] 08/29/2015 8:18 AM

## 2015-08-29 NOTE — Anesthesia Postprocedure Evaluation (Signed)
Anesthesia Post Note  Patient: [name] Consulting civil engineer  Procedure(s) Performed: Procedure(s) (LRB): CATARACT EXTRACTION PHACO AND INTRAOCULAR LENS PLACEMENT (IOC) (Left)  Patient location during evaluation: PACU Anesthesia Type: MAC Level of consciousness: awake and alert, awake and oriented Pain management: pain level controlled Vital Signs Assessment: post-procedure vital signs reviewed and stable Respiratory status: spontaneous breathing, nonlabored ventilation and respiratory function stable Cardiovascular status: stable Anesthetic complications: no    Last Vitals:  Vitals:   08/29/15 0642 08/29/15 0820  BP: 134/86 (!) 153/84  Pulse: 72 71  Resp: 18 13  Temp: (!) 36.1 C     Last Pain:  Vitals:   08/29/15 0820  TempSrc: Tympanic                 [employer]

## 2015-08-29 NOTE — Discharge Instructions (Signed)
Eye Surgery Discharge Instructions  Expect mild scratchy sensation or mild soreness. DO NOT RUB YOUR EYE!  The day of surgery:  Minimal physical activity, but bed rest is not required  No reading, computer work, or close hand work  No bending, lifting, or straining.  May watch TV  For 24 hours:  No driving, legal decisions, or alcoholic beverages  Safety precautions  Eat anything you prefer: It is better to start with liquids, then soup then solid foods.  _____ Eye patch should be worn until postoperative exam tomorrow.  ____ Solar shield eyeglasses should be worn for comfort in the sunlight/patch while sleeping  Resume all regular medications including aspirin or Coumadin if these were discontinued prior to surgery. You may shower, bathe, shave, or wash your hair. Tylenol may be taken for mild discomfort.  Call your doctor if you experience significant pain, nausea, or vomiting, fever > 101 or other signs of infection. [phone] or [phone] Specific instructions:  Follow-up Information    Tim Lair, MD .   Specialty:  Ophthalmology Why:  08-29-15 at 2:35 Contact information: [address] [place] [address] [phone]          Eye Surgery Discharge Instructions  Expect mild scratchy sensation or mild soreness. DO NOT RUB YOUR EYE!  The day of surgery:  Minimal physical activity, but bed rest is not required  No reading, computer work, or close hand work  No bending, lifting, or straining.  May watch TV  For 24 hours:  No driving, legal decisions, or alcoholic beverages  Safety precautions  Eat anything you prefer: It is better to start with liquids, then soup then solid foods.  _____ Eye patch should be worn until postoperative exam tomorrow.  ____ Solar shield eyeglasses should be worn for comfort in the sunlight/patch while sleeping  Resume all regular medications including aspirin or Coumadin if these were  discontinued prior to surgery. You may shower, bathe, shave, or wash your hair. Tylenol may be taken for mild discomfort.  Call your doctor if you experience significant pain, nausea, or vomiting, fever > 101 or other signs of infection. [phone] or [phone] Specific instructions:  Follow-up Information    Tim Lair, MD .   Specialty:  Ophthalmology Why:  08-29-15 at 2:35 Contact information: [place] [address] [phone]

## 2015-08-29 NOTE — H&P (Signed)
  All labs reviewed. Abnormal studies sent to patients PCP when indicated.  Previous H&P reviewed, patient examined, there are NO CHANGES.  Hannah Giron LOUIS7/31/20177:49 AM

## 2015-08-29 NOTE — Anesthesia Procedure Notes (Signed)
Procedure Name: MAC Performed by: Tiki Tucciarone Pre-anesthesia Checklist: Patient identified, Emergency Drugs available, Suction available, Patient being monitored and Timeout performed Patient Re-evaluated:Patient Re-evaluated prior to induction Oxygen Delivery Method: Nasal cannula       

## 2015-08-30 NOTE — Progress Notes (Signed)
Voice mail not set up.  Patient had follow up scheduled with Dr. Michelene Heady yesterday at 1435.

## 2015-11-23 ENCOUNTER — Inpatient Hospital Stay: Payer: Medicare Other | Attending: Obstetrics and Gynecology | Admitting: Obstetrics and Gynecology

## 2015-11-23 ENCOUNTER — Encounter: Payer: Self-pay | Admitting: Obstetrics and Gynecology

## 2015-11-23 VITALS — BP 149/74 | HR 84 | Temp 97.3°F | Resp 20 | Ht 64.5 in | Wt 220.3 lb

## 2015-11-23 DIAGNOSIS — Z79899 Other long term (current) drug therapy: Secondary | ICD-10-CM | POA: Insufficient documentation

## 2015-11-23 DIAGNOSIS — F419 Anxiety disorder, unspecified: Secondary | ICD-10-CM | POA: Insufficient documentation

## 2015-11-23 DIAGNOSIS — D279 Benign neoplasm of unspecified ovary: Secondary | ICD-10-CM

## 2015-11-23 DIAGNOSIS — E538 Deficiency of other specified B group vitamins: Secondary | ICD-10-CM | POA: Insufficient documentation

## 2015-11-23 DIAGNOSIS — R32 Unspecified urinary incontinence: Secondary | ICD-10-CM | POA: Insufficient documentation

## 2015-11-23 DIAGNOSIS — D489 Neoplasm of uncertain behavior, unspecified: Secondary | ICD-10-CM

## 2015-11-23 DIAGNOSIS — N840 Polyp of corpus uteri: Secondary | ICD-10-CM | POA: Diagnosis not present

## 2015-11-23 DIAGNOSIS — Z90710 Acquired absence of both cervix and uterus: Secondary | ICD-10-CM | POA: Insufficient documentation

## 2015-11-23 DIAGNOSIS — Z90722 Acquired absence of ovaries, bilateral: Secondary | ICD-10-CM | POA: Diagnosis not present

## 2015-11-23 DIAGNOSIS — D3912 Neoplasm of uncertain behavior of left ovary: Secondary | ICD-10-CM | POA: Insufficient documentation

## 2015-11-23 DIAGNOSIS — F329 Major depressive disorder, single episode, unspecified: Secondary | ICD-10-CM | POA: Insufficient documentation

## 2015-11-23 DIAGNOSIS — I10 Essential (primary) hypertension: Secondary | ICD-10-CM | POA: Insufficient documentation

## 2015-11-23 DIAGNOSIS — K219 Gastro-esophageal reflux disease without esophagitis: Secondary | ICD-10-CM | POA: Insufficient documentation

## 2015-11-23 DIAGNOSIS — F0390 Unspecified dementia without behavioral disturbance: Secondary | ICD-10-CM | POA: Insufficient documentation

## 2015-11-23 DIAGNOSIS — Z6837 Body mass index (BMI) 37.0-37.9, adult: Secondary | ICD-10-CM | POA: Insufficient documentation

## 2015-11-23 NOTE — Progress Notes (Signed)
Patient here for follow up. No concerns today. 

## 2015-11-23 NOTE — Progress Notes (Signed)
Gynecologic Oncology Consult Visit   Referring Provider: Dr. Ouida Sills  Chief Concern: Continued surveillance for sex cord-stromal tumor, NOS   Subjective:  [name] is a 75 y.o. female  P4 who was seen in consultation from Dr. Ouida Sills for for pelvic mass with postmenopausal bleeding/anemia and elevated CA125 who was diagnosed with a sex cord-stromal tumor, NOS.Please see her history as noted below.   On 04/19/15, patient underwent and uncomplicated exploratory laparoscopy, total abdominal hysterectomy, bilateral salping oophorectomy, left pelvic lymph node dissection, omentectomy with Dr. Fransisca Connors.   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 Pathology: DIAGNOSIS  A. 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 See comment.  Fallopian tube: Free of tumor involvement.   See Synoptic Report.   Comment: The morphology and immunohistochemical profile (diffusely positive for inhibin and calretinin) are consistent with a sex-cord stromal tumor. While the histology of certain areas are suggestive of a granulosa cell tumor, overall the nuclei are more rounded with more nucleoli and fewer nuclear grooves than would be expected in a granulosa cell tumor. Focal areas of increased mitotic activity (up to 6/hpf) are also observed. Similarly, no identifiable tubule formations of Sertoli cells or discrete populations of Leydig cells are seen. For these reasons, the tumor is best classified as a sex cord-stromal tumor, not otherwise specified. This specimen has been reviewed with Drs. Rex Telford Nab and Delmer Islam, who agree with the above interpretation.   B. Uterus, cervix, right ovary and fallopian tube, hysterectomy and salpingo-oophorectomy:  Uterus (419 grams) Endometrium: Benign polyp, 4.6 cm. Markedly decidualized endometrium, consistent with exogenous hormone therapy. Myometrium:  Adenomyosis. Leiomyomata. Cervix: No pathologic diagnosis. Serosa: No pathologic diagnosis. Free of tumor.   Right ovary: No pathologic diagnosis. Free of tumor.  Right fallopian tube: No pathologic diagnosis. Free of tumor.    C. Left external iliac lymph node, excision:  Four lymph nodes, negative for malignancy (0/4).   D. Omentum, omentectomy:  Fibroadipose tissue, negative for malignancy.      Gynecologic Oncology  She has a long history in that she was admitted to [facility] in 7/15 with PMB, iron deficiency anemia and an 8 cm left pelvic mass. CA125 was 10. PAP 08/21/14 AGUS. Dr Ouida Sills did an endometrial bx 08/25/14 that showed polyp with simple hyperplasia and disordered proliferative endometrium. She refused surgery offered by Dr Ouida Sills and Dr Jacquelyne Balint and was lost to follow up.   She was admitted again 01/30/15 after presenting to the emergency department because of nausea and vomiting and possibly melena. Hemoglobin was 5.3 with MCV 46.9, Hct 20.7, Plts 454k, WBC 9,600. Hgb 7.8 after 2 units of blood. CT scan 1/17 showed that the heterogeneous pelvic mass has grown to 17 cm. CT also shows some ascites, but no carcinomatosis. CA125 now 112 and CEA 1.6 normal. Dr Ouida Sills examined the patient in his office 1/3 and endometrial biopsy and PAP were negative.02/05/15 she received an additional blood transfusion.    States she had an IUD, but not sure if it was removed.   She has been in a SNF since discharge due to immobility.  She saw Dr. Fransisca Connors on 02/23/2015. Her exam was limited due to habitus. He recommended colonoscopy/endoscopy to evaluate the source of bleeding. He also recommended that she see Dr. Nicki Reaper for management of hypertension. She did not follow up with her appointment with Dr. Einar Pheasant.   CT a/p on 02/15/2015 The left adnexal mass has markedly enlarged,  now measuring 16.9 cm and previously measuring 8 cm. It is heterogeneous  with solid common calcified, and and fluid elements.  CA125 on 1/3/17was 112 and her CEA was 1.6.  She underwent colonoscopy and EGD on 02/14/2015 - no acute abnormalities noted.  She was recommended to undergo surgical evaluation.      Problem List: Patient Active Problem List   Diagnosis Date Noted  . 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 Essential hypertension 03/21/2015  . Vision decreased 03/21/2015  . Tension headache 03/21/2015  . Anemia due to chronic blood loss 03/16/2015  . Morbid obesity ([place]) 02/02/2015  . Anemia 01/30/2015    Past Medical History: Past Medical History:  Diagnosis Date  . Abdominal distension   . Anemia   . Anxiety   . B12 deficiency   . Balance problem   . Blood in stool   . Cancer (HCC)    OVARIAN  . Colon polyps   . Dementia    EARLY  . Dementia    EARLY  . Depression   . GERD (gastroesophageal reflux disease)   . History of blood transfusion   . Hypertension   . Hypomagnesemia   . Urine incontinence     Past Surgical History: Past Surgical History:  Procedure Laterality Date  . ABDOMINAL HYSTERECTOMY    . CATARACT EXTRACTION W/PHACO Right 07/26/2015   Procedure: CATARACT EXTRACTION PHACO AND INTRAOCULAR LENS PLACEMENT (IOC);  Surgeon: Birder Robson, MD;  Location: [facility] ORS;  Service: Ophthalmology;  Laterality: Right;  [name]   1:03.5 AP%  23% CDE   14.59 fluid pack lot # [id number]  . CATARACT EXTRACTION W/PHACO Left 08/29/2015   Procedure: CATARACT EXTRACTION PHACO AND INTRAOCULAR LENS PLACEMENT ([place]);  Surgeon: Birder Robson, MD;  Location: [facility] ORS;  Service: Ophthalmology;  Laterality: Left;  [name] 00:40 AP% 20.9 CDE 8.50 fluid pack lot # [id number]  . COLONOSCOPY WITH PROPOFOL N/A 03/14/2015   Procedure: COLONOSCOPY WITH PROPOFOL;  Surgeon: Hulen Luster, MD;  Location: [facility] ENDOSCOPY;  Service: Gastroenterology;  Laterality: N/A;  . ESOPHAGOGASTRODUODENOSCOPY (EGD) WITH PROPOFOL N/A 03/14/2015   Procedure: ESOPHAGOGASTRODUODENOSCOPY  (EGD) WITH PROPOFOL;  Surgeon: Hulen Luster, MD;  Location: [facility] ENDOSCOPY;  Service: Gastroenterology;  Laterality: N/A;  . none    . TOTAL ABDOMINAL HYSTERECTOMY W/ BILATERAL SALPINGOOPHORECTOMY  04/19/2015   exploratory laparoscopy, total abdominal hysterectomy, bilateral salping oophorectomy, left pelvic lymph node dissection, omentectomy   Family History: Family History  Problem Relation Age of Onset  . Hypertension      Social History: Social History   Social History  . Marital status: Legally Separated    Spouse name: N/A  . Number of children: N/A  . Years of education: N/A   Occupational History  . Not on file.   Social History Main Topics  . Smoking status: Never Smoker  . Smokeless tobacco: Not on file  . Alcohol use No  . Drug use: No  . Sexual activity: Not on file   Other Topics Concern  . Not on file   Social History Narrative  . No narrative on file    Allergies: No Known Allergies  Current Medications: Current Outpatient Prescriptions  Medication Sig Dispense Refill  . calcium carbonate (TUMS - DOSED IN MG ELEMENTAL CALCIUM) 500 MG chewable tablet Chew 1 tablet by mouth every 6 (six) hours as needed for indigestion or heartburn.    . cholecalciferol (VITAMIN D) 400 units TABS tablet Take 1,000 Units by mouth daily.    Marland Kitchen  citalopram (CELEXA) 10 MG tablet Take 10 mg by mouth daily.    . hydrochlorothiazide (MICROZIDE) 12.5 MG capsule Take 1 capsule (12.5 mg total) by mouth daily. 90 capsule 0  . ibuprofen (ADVIL,MOTRIN) 600 MG tablet Take 600 mg by mouth every 6 (six) hours as needed.    . magnesium oxide (MAG-OX) 400 MG tablet Take 400 mg by mouth daily.    . ondansetron (ZOFRAN) 4 MG tablet Take 4 mg by mouth every 8 (eight) hours as needed for nausea or vomiting.    . vitamin B-12 (CYANOCOBALAMIN) 1000 MCG tablet Take 1,000 mcg by mouth daily.     No current facility-administered medications for this visit.     Review of Systems General: no  complaints  HEENT: no complaints  Lungs: no complaints  Cardiac: no complaints  GI: See interval history  GU: See interval history  Musculoskeletal: no complaints  Extremities: no complaints  Skin: no complaints  Neuro: no complaints  Endocrine: no complaints  Psych: no complaints      Objective:  Physical Examination:  BP (!) 149/74 (BP Location: Left Arm, Patient Position: Sitting)   Pulse 84   Temp 97.3 F (36.3 C) (Tympanic)   Resp 20   Ht 5' 4.5" (1.638 m)   Wt 220 lb 5.6 oz (100 kg)   BMI 37.24 kg/m     ECOG Performance Status: 2 - Symptomatic, <50% confined to bed  General appearance: alert, cooperative and appears stated age HEENT:PERRLA, extra ocular movement intact, neck supple with midline trachea and thyroid without masses Lymph node survey: non-palpable inguinal, supraclavicular; she could not tolerate axillary exam Cardiovascular: regular rate and rhythm Respiratory: normal air entry, lungs clear to auscultation  Breast exam: not examined. Abdomen: protuberant, without guarding, no hernias and well healed incision, no ascites or masses Back: inspection of back is normal Extremities: 1+ edema bilaterally Skin exam - normal coloration and turgor, no rashes, no suspicious skin lesions noted. Neurological exam reveals alert, oriented, normal speech, no focal findings or movement disorder noted.  Pelvic: exam chaperoned by nurse;  Vulva: normal appearing vulva with no masses, tenderness or lesions; Vagina: no gross lesions. Uterus/Cervix: surgically absent. BME limited by habitus but no masses. Rectal: not indicated       Assessment:  Leda Rupal Wetherell is a 75 y.o. female diagnosed with benign sex-cord stromal tumor s/p exploratory laparoscopy, total abdominal hysterectomy, bilateral salping oophorectomy, left pelvic lymph node dissection, omentectomy. NED.  Medical comorbidties: morbid obesity with relative immobility and living in a SNF since hospital  discharge, hypertension. Noncompliant with medical advice in the past.   Plan:   Problem List Items Addressed This Visit      Genitourinary   Sex cord stromal tumor    Other Visit Diagnoses    Benign stromal tumor of ovary, unspecified laterality    -  Primary     Per Tumor Board discussion she does not need further treatment. The recommendation was for continued surveillance. Plan to follow up in 6 months and if doing well at that time we can begin alternating visits with Dr. Ouida Sills.     The patient's diagnosis, an outline of the further diagnostic and laboratory studies which will be required, the recommendation, and alternatives were discussed. All questions were answered to the patient's satisfaction.    Gillis Ends, MD  CC: Dr. Ouida Sills

## 2015-11-23 NOTE — Progress Notes (Signed)
  Oncology Nurse Navigator Documentation  Navigator Location: [facility] (11/23/15 1100)   )                   Chaperoned pelvic exam. Follow up 6 months.   Patient Visit Type: GynOnc (11/23/15 1100)                              Time Spent with Patient: 15 (11/23/15 1100)

## 2016-02-03 ENCOUNTER — Encounter: Payer: Self-pay | Admitting: Nurse Practitioner

## 2016-02-03 ENCOUNTER — Ambulatory Visit (INDEPENDENT_AMBULATORY_CARE_PROVIDER_SITE_OTHER): Payer: Medicare Other | Admitting: Nurse Practitioner

## 2016-02-03 VITALS — BP 150/78 | HR 93 | Temp 98.3°F | Ht 64.5 in | Wt 225.0 lb

## 2016-02-03 DIAGNOSIS — Z7689 Persons encountering health services in other specified circumstances: Secondary | ICD-10-CM | POA: Diagnosis not present

## 2016-02-03 DIAGNOSIS — I10 Essential (primary) hypertension: Secondary | ICD-10-CM | POA: Diagnosis not present

## 2016-02-03 NOTE — Progress Notes (Signed)
Subjective:    Patient ID: [name], female    DOB: January 21, 1941, 76 y.o.   MRN: [id number]  Patient presents today for establish care (new patient)  HPI  Admitted to SNF post hystrectomy and ovarian tumor resection 03/2015 by [facility] 04/2015-12/2015.  discharged from [place], [place].  Does not need med refills today.  Labs done by rehab facility prior to discharge in December, 2017.  Reviewed medication list from rehab facility.  Immunizations: (TDAP, Hep C screen, Pneumovax, Influenza, zoster)  Health Maintenance  Topic Date Due  . Flu Shot  04/28/2016*  . Mammogram  02/01/2017*  . DEXA scan (bone density measurement)  02/01/2017*  . Shingles Vaccine  02/01/2017*  . Tetanus Vaccine  02/01/2017*  . Pneumonia vaccines (1 of 2 - PCV13) 02/01/2017*  . Colon Cancer Screening  03/13/2025  *Topic was postponed. The date shown is not the original due date.   Diet:regular Weight:  Wt Readings from Last 3 Encounters:  02/03/16 225 lb (102.1 kg)  11/23/15 220 lb 5.6 oz (100 kg)  08/29/15 191 lb (86.6 kg)    Exercise:ambulates  With cane Fall Risk: Fall Risk  02/03/2016  Falls in the past year? No   Home Safety: home with friend Suzzanne Cloud), 3stairs into house, no grab bars in bathroom, no loose rugs Depression/Suicide: Depression screen [facility] 2/9 02/03/2016  Decreased Interest 0  Down, Depressed, Hopeless 0  PHQ - 2 Score 0   No flowsheet data found. Colonoscopy (every 5-58yrs, >50-66yrs):declined Dexa (every 2-62yrs, >72yrs):declined Mammogram (yearly, >46yrs):declined Vision: needed Dental:upper and lower dentures Advanced Directive: Advanced Directives 11/23/2015  Does Patient Have a Medical Advance Directive? No  Would patient [name] information on creating a medical advance directive? No - patient declined information   Sexual History (birth control, marital status, STD):seperated from husband for 83yrs, has 3adult  children.  Medications and allergies reviewed with patient and updated if appropriate.  Patient Active Problem List   Diagnosis Date Noted  . 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 Hypomagnesemia 04/25/2015  . Hypokalemia 04/23/2015  . Essential (primary) hypertension 03/21/2015  . Vision decreased 03/21/2015  . Tension-type headache, not intractable 03/21/2015  . Anemia due to chronic blood loss 03/16/2015  . Morbid obesity ([place]) 02/02/2015  . Anemia 01/30/2015    Current Outpatient Prescriptions on File Prior to Visit  Medication Sig Dispense Refill  . cholecalciferol (VITAMIN D) 400 units TABS tablet Take 1,000 Units by mouth daily.    . citalopram (CELEXA) 10 MG tablet Take 10 mg by mouth daily.    . hydrochlorothiazide (MICROZIDE) 12.5 MG capsule Take 1 capsule (12.5 mg total) by mouth daily. 90 capsule 0  . ibuprofen (ADVIL,MOTRIN) 600 MG tablet Take 600 mg by mouth every 6 (six) hours as needed.    . magnesium oxide (MAG-OX) 400 MG tablet Take 400 mg by mouth daily.    . vitamin B-12 (CYANOCOBALAMIN) 1000 MCG tablet Take 1,000 mcg by mouth daily.    . calcium carbonate (TUMS - DOSED IN MG ELEMENTAL CALCIUM) 500 MG chewable tablet Chew 1 tablet by mouth every 6 (six) hours as needed for indigestion or heartburn.    . ondansetron (ZOFRAN) 4 MG tablet Take 4 mg by mouth every 8 (eight) hours as needed for nausea or vomiting.     No current facility-administered medications on file prior to visit.     Past Medical History:  Diagnosis Date  . Abdominal distension   . Anemia   .  Anxiety   . B12 deficiency   . Balance problem   . Blood in stool   . Cancer (HCC)    OVARIAN  . Colon polyps   . Dementia    EARLY  . Dementia    EARLY  . Depression   . GERD (gastroesophageal reflux disease)   . History of blood transfusion   . Hypertension   . Hypomagnesemia   . Urine incontinence     Past Surgical History:  Procedure Laterality Date  . ABDOMINAL HYSTERECTOMY    .  CATARACT EXTRACTION W/PHACO Right 07/26/2015   Procedure: CATARACT EXTRACTION PHACO AND INTRAOCULAR LENS PLACEMENT (IOC);  Surgeon: Birder Robson, MD;  Location: [facility] ORS;  Service: Ophthalmology;  Laterality: Right;  [name]   1:03.5 AP%  23% CDE   14.59 fluid pack lot # [id number]  . CATARACT EXTRACTION W/PHACO Left 08/29/2015   Procedure: CATARACT EXTRACTION PHACO AND INTRAOCULAR LENS PLACEMENT ([place]);  Surgeon: Birder Robson, MD;  Location: [facility] ORS;  Service: Ophthalmology;  Laterality: Left;  [name] 00:40 AP% 20.9 CDE 8.50 fluid pack lot # [id number]  . COLONOSCOPY WITH PROPOFOL N/A 03/14/2015   Procedure: COLONOSCOPY WITH PROPOFOL;  Surgeon: Hulen Luster, MD;  Location: [facility] ENDOSCOPY;  Service: Gastroenterology;  Laterality: N/A;  . ESOPHAGOGASTRODUODENOSCOPY (EGD) WITH PROPOFOL N/A 03/14/2015   Procedure: ESOPHAGOGASTRODUODENOSCOPY (EGD) WITH PROPOFOL;  Surgeon: Hulen Luster, MD;  Location: [facility] ENDOSCOPY;  Service: Gastroenterology;  Laterality: N/A;  . none    . TOTAL ABDOMINAL HYSTERECTOMY W/ BILATERAL SALPINGOOPHORECTOMY  04/19/2015   exploratory laparoscopy, total abdominal hysterectomy, bilateral salping oophorectomy, left pelvic lymph node dissection, omentectomy    Social History   Social History  . Marital status: Legally Separated    Spouse name: N/A  . Number of children: N/A  . Years of education: N/A   Social History Main Topics  . Smoking status: Never Smoker  . Smokeless tobacco: Never Used  . Alcohol use No  . Drug use: No  . Sexual activity: Not Asked   Other Topics Concern  . None   Social History Narrative  . None    Family History  Problem Relation Age of Onset  . Family history unknown: Yes        Review of Systems  Constitutional: Negative for fever, malaise/fatigue and weight loss.  HENT: Negative for congestion and sore throat.   Eyes:       Negative for visual changes  Respiratory: Negative for cough and shortness of breath.   Cardiovascular:  Negative for chest pain, palpitations and leg swelling.  Gastrointestinal: Negative for blood in stool, constipation, diarrhea and heartburn.  Genitourinary: Negative for dysuria, frequency and urgency.  Musculoskeletal: Negative for falls, joint pain and myalgias.  Skin: Negative for rash.  Neurological: Negative for dizziness, sensory change and headaches.  Endo/Heme/Allergies: Does not bruise/bleed easily.  Psychiatric/Behavioral: Negative for depression, substance abuse and suicidal ideas. The patient is not nervous/anxious.     Objective:   Vitals:   02/03/16 1123  BP: (!) 150/78  Pulse: 93  Temp: 98.3 F (36.8 C)    Body mass index is 38.02 kg/m.   Physical Examination:  Physical Exam  Constitutional: She is oriented to person, place, and time. No distress.  Eyes: Conjunctivae and EOM are normal. Pupils are equal, round, and reactive to light. No scleral icterus.  Neck: Normal range of motion. Neck supple. No thyromegaly present.  Cardiovascular: Normal rate and normal heart sounds.   Pulmonary/Chest: Effort normal and breath  sounds normal.  Abdominal: Soft. Bowel sounds are normal. She exhibits no distension. There is no tenderness. There is no rebound and no guarding.  Musculoskeletal: She exhibits edema.  Bilateral ankle edema (non pitting)  Lymphadenopathy:    She has no cervical adenopathy.  Neurological: She is alert and oriented to person, place, and time. No cranial nerve deficit.  ambulates with cane  Skin: Skin is warm and dry.  Vitals reviewed.   ASSESSMENT and PLAN:  Jenaveve was seen today for establish care.  Diagnoses and all orders for this visit:  Encounter to establish care with new doctor  Essential hypertension    No problem-specific Assessment & Plan notes found for this encounter.      Follow up: Return in about 3 months (around 05/03/2016) for HTN, and anemia (fasting).  Wilfred Lacy, NP

## 2016-02-03 NOTE — Patient Instructions (Signed)
Sign medical release form to get records from [facility] facility.

## 2016-02-03 NOTE — Progress Notes (Signed)
Pre visit review using our clinic review tool, if applicable. No additional management support is needed unless otherwise documented below in the visit note. 

## 2016-03-06 ENCOUNTER — Ambulatory Visit (INDEPENDENT_AMBULATORY_CARE_PROVIDER_SITE_OTHER): Payer: Medicare Other | Admitting: Nurse Practitioner

## 2016-03-06 ENCOUNTER — Encounter: Payer: Self-pay | Admitting: Nurse Practitioner

## 2016-03-06 ENCOUNTER — Other Ambulatory Visit (INDEPENDENT_AMBULATORY_CARE_PROVIDER_SITE_OTHER): Payer: Medicare Other

## 2016-03-06 VITALS — BP 144/82 | HR 99 | Temp 98.4°F | Ht 64.5 in | Wt 219.0 lb

## 2016-03-06 DIAGNOSIS — E782 Mixed hyperlipidemia: Secondary | ICD-10-CM | POA: Diagnosis not present

## 2016-03-06 DIAGNOSIS — E876 Hypokalemia: Secondary | ICD-10-CM

## 2016-03-06 DIAGNOSIS — L608 Other nail disorders: Secondary | ICD-10-CM

## 2016-03-06 DIAGNOSIS — E559 Vitamin D deficiency, unspecified: Secondary | ICD-10-CM | POA: Diagnosis not present

## 2016-03-06 DIAGNOSIS — R739 Hyperglycemia, unspecified: Secondary | ICD-10-CM

## 2016-03-06 DIAGNOSIS — Z136 Encounter for screening for cardiovascular disorders: Secondary | ICD-10-CM

## 2016-03-06 DIAGNOSIS — H547 Unspecified visual loss: Secondary | ICD-10-CM

## 2016-03-06 DIAGNOSIS — D649 Anemia, unspecified: Secondary | ICD-10-CM

## 2016-03-06 DIAGNOSIS — E118 Type 2 diabetes mellitus with unspecified complications: Secondary | ICD-10-CM

## 2016-03-06 DIAGNOSIS — R2681 Unsteadiness on feet: Secondary | ICD-10-CM | POA: Diagnosis not present

## 2016-03-06 DIAGNOSIS — Z794 Long term (current) use of insulin: Secondary | ICD-10-CM | POA: Diagnosis not present

## 2016-03-06 DIAGNOSIS — Z13220 Encounter for screening for lipoid disorders: Secondary | ICD-10-CM

## 2016-03-06 DIAGNOSIS — I10 Essential (primary) hypertension: Secondary | ICD-10-CM

## 2016-03-06 LAB — LDL CHOLESTEROL, DIRECT: Direct LDL: 139 mg/dL

## 2016-03-06 LAB — CBC WITH DIFFERENTIAL/PLATELET
BASOS ABS: 0.1 10*3/uL (ref 0.0–0.1)
Basophils Relative: 1.1 % (ref 0.0–3.0)
EOS ABS: 0.3 10*3/uL (ref 0.0–0.7)
Eosinophils Relative: 3.5 % (ref 0.0–5.0)
HCT: 45 % (ref 36.0–46.0)
Hemoglobin: 15.5 g/dL — ABNORMAL HIGH (ref 12.0–15.0)
LYMPHS PCT: 21.5 % (ref 12.0–46.0)
Lymphs Abs: 1.8 10*3/uL (ref 0.7–4.0)
MCHC: 34.5 g/dL (ref 30.0–36.0)
MCV: 81.1 fl (ref 78.0–100.0)
MONOS PCT: 7.4 % (ref 3.0–12.0)
Monocytes Absolute: 0.6 10*3/uL (ref 0.1–1.0)
Neutro Abs: 5.4 10*3/uL (ref 1.4–7.7)
Neutrophils Relative %: 66.5 % (ref 43.0–77.0)
PLATELETS: 340 10*3/uL (ref 150.0–400.0)
RBC: 5.56 Mil/uL — AB (ref 3.87–5.11)
RDW: 13.6 % (ref 11.5–15.5)
WBC: 8.2 10*3/uL (ref 4.0–10.5)

## 2016-03-06 LAB — LIPID PANEL
Cholesterol: 235 mg/dL — ABNORMAL HIGH (ref 0–200)
HDL: 54.4 mg/dL (ref >39.00)
NonHDL: 180.72
TRIGLYCERIDES: 269 mg/dL — AB (ref 0.0–149.0)
Total CHOL/HDL Ratio: 4
VLDL: 53.8 mg/dL — AB (ref 0.0–40.0)

## 2016-03-06 LAB — MAGNESIUM: MAGNESIUM: 1.8 mg/dL (ref 1.5–2.5)

## 2016-03-06 LAB — BASIC METABOLIC PANEL
BUN: 19 mg/dL (ref 6–23)
CALCIUM: 10.2 mg/dL (ref 8.4–10.5)
CO2: 29 mEq/L (ref 19–32)
CREATININE: 0.96 mg/dL (ref 0.40–1.20)
Chloride: 94 mEq/L — ABNORMAL LOW (ref 96–112)
GFR: 73.08 mL/min (ref >60.00)
Glucose, Bld: 476 mg/dL — ABNORMAL HIGH (ref 70–99)
Potassium: 4.2 mEq/L (ref 3.5–5.1)
SODIUM: 131 meq/L — AB (ref 135–145)

## 2016-03-06 NOTE — Progress Notes (Signed)
Pre visit review using our clinic review tool, if applicable. No additional management support is needed unless otherwise documented below in the visit note. 

## 2016-03-06 NOTE — Progress Notes (Addendum)
Subjective:    Patient ID: [name], female    DOB: 22-Sep-1940, 76 y.o.   MRN: [id number]  Patient presents today for complete physical and unsteady re evaluation.  HPI   HTN: controlled with HCTZ.  Unsteady Gait: generalized weakness and unsteady gait since discharge from rehab facility. Ambulates with cane at this time, but still feels unsteady. Denies any dizziness or syncope.  Immunizations: (TDAP, Hep C screen, Pneumovax, Influenza, zoster)  Health Maintenance  Topic Date Due  . Complete foot exam   03/26/1952  . Eye exam for diabetics  03/26/1950  . Flu Shot  04/28/2016*  . Mammogram  02/01/2017*  . DEXA scan (bone density measurement)  02/01/2017*  . Shingles Vaccine  02/01/2017*  . Tetanus Vaccine  02/01/2017*  . Pneumonia vaccines (1 of 2 - PCV13) 02/01/2017*  . Hemoglobin A1C  09/06/2016  . Colon Cancer Screening  03/13/2025  *Topic was postponed. The date shown is not the original due date.   Diet:regular Weight:  Wt Readings from Last 3 Encounters:  03/06/16 219 lb (99.3 kg)  02/03/16 225 lb (102.1 kg)  11/23/15 220 lb 5.6 oz (100 kg)   Exercise:none, due to unsteady gait Fall Risk: Fall Risk  02/03/2016  Falls in the past year? No   Home Safety:home alone Depression/Suicide: Depression screen [facility] 2/9 02/03/2016  Decreased Interest 0  Down, Depressed, Hopeless 0  PHQ - 2 Score 0   No flowsheet data found. Colonoscopy (every 5-56yr, >50-719yr:up to date Dexa (every 2-5y41yr>65y9yrp to date Mammogram (yearly, >33yr69yr to date Vision:needs referral to opthalmologist Dental:needed Advanced Directive: Advanced Directives 11/23/2015  Does Patient Have a Medical Advance Directive? No  Would patient [name] information on creating a medical advance directive? No - patient declined information   Medications and allergies reviewed with patient and updated if appropriate.  Patient Active Problem List   Diagnosis Date Noted  . Vitamin D  deficiency 03/06/2016  . 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 Essential (primary) hypertension 03/21/2015  . Vision decreased 03/21/2015  . Tension-type headache, not intractable 03/21/2015  . Anemia due to chronic blood loss 03/16/2015  . Morbid obesity (HCC) [place]04/2017  . Anemia 01/30/2015    Current Outpatient Prescriptions on File Prior to Visit  Medication Sig Dispense Refill  . cholecalciferol (VITAMIN D) 400 units TABS tablet Take 1,000 Units by mouth daily.    . hydrochlorothiazide (MICROZIDE) 12.5 MG capsule Take 1 capsule (12.5 mg total) by mouth daily. 90 capsule 0  . ibuprofen (ADVIL,MOTRIN) 600 MG tablet Take 600 mg by mouth every 6 (six) hours as needed.    . magnesium oxide (MAG-OX) 400 MG tablet Take 400 mg by mouth daily.    . vitamin B-12 (CYANOCOBALAMIN) 1000 MCG tablet Take 1,000 mcg by mouth daily.    . calcium carbonate (TUMS - DOSED IN MG ELEMENTAL CALCIUM) 500 MG chewable tablet Chew 1 tablet by mouth every 6 (six) hours as needed for indigestion or heartburn.    . citalopram (CELEXA) 10 MG tablet Take 10 mg by mouth daily.    . ondansetron (ZOFRAN) 4 MG tablet Take 4 mg by mouth every 8 (eight) hours as needed for nausea or vomiting.     No current facility-administered medications on file prior to visit.     Past Medical History:  Diagnosis Date  . Abdominal distension   . Anemia   . Anxiety   . B12 deficiency   . Balance problem   . Blood in  stool   . Cancer (HCC)    OVARIAN  . Colon polyps   . Dementia    EARLY  . Dementia    EARLY  . Depression   . GERD (gastroesophageal reflux disease)   . History of blood transfusion   . Hypertension   . Hypomagnesemia   . Urine incontinence     Past Surgical History:  Procedure Laterality Date  . ABDOMINAL HYSTERECTOMY    . CATARACT EXTRACTION W/PHACO Right 07/26/2015   Procedure: CATARACT EXTRACTION PHACO AND INTRAOCULAR LENS PLACEMENT (IOC);  Surgeon: Birder Robson, MD;  Location: [facility] ORS;   Service: Ophthalmology;  Laterality: Right;  [name]   1:03.5 AP%  23% CDE   14.59 fluid pack lot # [id number]  . CATARACT EXTRACTION W/PHACO Left 08/29/2015   Procedure: CATARACT EXTRACTION PHACO AND INTRAOCULAR LENS PLACEMENT ([place]);  Surgeon: Birder Robson, MD;  Location: [facility] ORS;  Service: Ophthalmology;  Laterality: Left;  [name] 00:40 AP% 20.9 CDE 8.50 fluid pack lot # [id number]  . COLONOSCOPY WITH PROPOFOL N/A 03/14/2015   Procedure: COLONOSCOPY WITH PROPOFOL;  Surgeon: Hulen Luster, MD;  Location: [facility] ENDOSCOPY;  Service: Gastroenterology;  Laterality: N/A;  . ESOPHAGOGASTRODUODENOSCOPY (EGD) WITH PROPOFOL N/A 03/14/2015   Procedure: ESOPHAGOGASTRODUODENOSCOPY (EGD) WITH PROPOFOL;  Surgeon: Hulen Luster, MD;  Location: [facility] ENDOSCOPY;  Service: Gastroenterology;  Laterality: N/A;  . none    . TOTAL ABDOMINAL HYSTERECTOMY W/ BILATERAL SALPINGOOPHORECTOMY  04/19/2015   exploratory laparoscopy, total abdominal hysterectomy, bilateral salping oophorectomy, left pelvic lymph node dissection, omentectomy    Social History   Social History  . Marital status: Legally Separated    Spouse name: N/A  . Number of children: N/A  . Years of education: N/A   Social History Main Topics  . Smoking status: Never Smoker  . Smokeless tobacco: Never Used  . Alcohol use No  . Drug use: No  . Sexual activity: Not Asked   Other Topics Concern  . None   Social History Narrative  . None    Family History  Problem Relation Age of Onset  . Family history unknown: Yes        Review of Systems  Constitutional: Negative for chills, fever, malaise/fatigue and weight loss.  HENT: Negative for congestion and sore throat.   Eyes: Positive for blurred vision. Negative for double vision, photophobia, pain, discharge and redness.       Negative for visual changes  Respiratory: Negative for cough and shortness of breath.   Cardiovascular: Negative for chest pain, palpitations and leg swelling.    Gastrointestinal: Negative for blood in stool, constipation, diarrhea and heartburn.  Genitourinary: Negative for dysuria, frequency and urgency.  Musculoskeletal: Negative for falls, joint pain and myalgias.  Skin: Negative for rash.  Neurological: Positive for weakness. Negative for dizziness, sensory change and headaches.  Endo/Heme/Allergies: Does not bruise/bleed easily.  Psychiatric/Behavioral: Negative for depression, substance abuse and suicidal ideas. The patient is not nervous/anxious.     Objective:   Vitals:   03/06/16 1134  BP: (!) 144/82  Pulse: 99  Temp: 98.4 F (36.9 C)    Body mass index is 37.01 kg/m.   Physical Examination:  Physical Exam  Constitutional: She is oriented to person, place, and time and well-developed, well-nourished, and in no distress. No distress.  HENT:  Right Ear: External ear normal.  Left Ear: External ear normal.  Nose: Nose normal.  Mouth/Throat: Oropharynx is clear and moist. No oropharyngeal exudate.  Eyes: Conjunctivae and EOM are normal.  Pupils are equal, round, and reactive to light. No scleral icterus.  Neck: Normal range of motion. Neck supple. No thyromegaly present.  Cardiovascular: Normal rate, normal heart sounds and intact distal pulses.   Pulmonary/Chest: Effort normal and breath sounds normal. She exhibits no tenderness.  Abdominal: Soft. Bowel sounds are normal. She exhibits no distension. There is no tenderness.  Musculoskeletal: Normal range of motion. She exhibits tenderness. She exhibits no edema.  Bilateral ankle edema (mild)  Lymphadenopathy:    She has no cervical adenopathy.  Neurological: She is alert and oriented to person, place, and time.  Ambulates with cane. Unsteady gait  Skin: Skin is warm and dry.  Normal microfilament sensation. Normal distal pulses. Hypertrophic toenails. No erythema or swelling.  Psychiatric: Affect and judgment normal.   ASSESSMENT and PLAN:  [name] was seen today for  annual exam.  Diagnoses and all orders for this visit:  Essential (primary) hypertension -     Basic metabolic panel; Future  Anemia, unspecified type -     CBC w/Diff; Future  Hypokalemia -     Basic metabolic panel; Future  Hypomagnesemia -     Magnesium; Future  Unsteady gait -     Ambulatory referral to Physical Therapy  Vitamin D deficiency -     Vitamin D 1,25 dihydroxy; Future  Encounter for lipid screening for cardiovascular disease -     Lipid panel; Future  Vision decreased -     Ambulatory referral to Ophthalmology  Discoloration and thickening of nails both feet -     Ambulatory referral to Podiatry  Type 2 diabetes mellitus with complication, with long-term current use of insulin ([place]) -     Ambulatory referral to Ophthalmology -     Ambulatory referral to Podiatry -     sitaGLIPtin (JANUVIA) 100 MG tablet; Take 1 tablet (100 mg total) by mouth daily. -     blood glucose meter kit and supplies KIT; Dispense based on patient and insurance preference. Use up to three times daily before meals. (FOR ICD-9 250.00, 250.01).   Recent Results (from the past 2160 hour(s))  Magnesium     Status: None   Collection Time: 03/06/16 12:11 PM  Result Value Ref Range   Magnesium 1.8 1.5 - 2.5 mg/dL  Basic metabolic panel     Status: Abnormal   Collection Time: 03/06/16 12:11 PM  Result Value Ref Range   Sodium 131 (L) 135 - 145 mEq/L   Potassium 4.2 3.5 - 5.1 mEq/L   Chloride 94 (L) 96 - 112 mEq/L   CO2 29 19 - 32 mEq/L   Glucose, Bld 476 (H) 70 - 99 mg/dL   BUN 19 6 - 23 mg/dL   Creatinine, Ser 0.96 0.40 - 1.20 mg/dL   Calcium 10.2 8.4 - 10.5 mg/dL   GFR 73.08 >60.00 mL/min  CBC w/Diff     Status: Abnormal   Collection Time: 03/06/16 12:11 PM  Result Value Ref Range   WBC 8.2 4.0 - 10.5 K/uL   RBC 5.56 (H) 3.87 - 5.11 Mil/uL   Hemoglobin 15.5 (H) 12.0 - 15.0 g/dL   HCT 45.0 36.0 - 46.0 %   MCV 81.1 78.0 - 100.0 fl   MCHC 34.5 30.0 - 36.0 g/dL   RDW 13.6  11.5 - 15.5 %   Platelets 340.0 150.0 - 400.0 K/uL   Neutrophils Relative % 66.5 43.0 - 77.0 %   Lymphocytes Relative 21.5 12.0 - 46.0 %   Monocytes Relative 7.4  3.0 - 12.0 %   Eosinophils Relative 3.5 0.0 - 5.0 %   Basophils Relative 1.1 0.0 - 3.0 %   Neutro Abs 5.4 1.4 - 7.7 K/uL   Lymphs Abs 1.8 0.7 - 4.0 K/uL   Monocytes Absolute 0.6 0.1 - 1.0 K/uL   Eosinophils Absolute 0.3 0.0 - 0.7 K/uL   Basophils Absolute 0.1 0.0 - 0.1 K/uL  Lipid panel     Status: Abnormal   Collection Time: 03/06/16 12:11 PM  Result Value Ref Range   Cholesterol 235 (H) 0 - 200 mg/dL    Comment: ATP III Classification       Desirable:  < 200 mg/dL               Borderline High:  200 - 239 mg/dL          High:  > = 240 mg/dL   Triglycerides 269.0 (H) 0.0 - 149.0 mg/dL    Comment: Normal:  <150 mg/dLBorderline High:  150 - 199 mg/dL   HDL 54.40 >39.00 mg/dL   VLDL 53.8 (H) 0.0 - 40.0 mg/dL   Total CHOL/HDL Ratio 4     Comment:                Men          Women1/2 Average Risk     3.4          3.3Average Risk          5.0          4.42X Average Risk          9.6          7.13X Average Risk          15.0          11.0                       NonHDL 180.72     Comment: NOTE:  Non-HDL goal should be 30 mg/dL higher than patient's LDL goal (i.e. LDL goal of < 70 mg/dL, would have non-HDL goal of < 100 mg/dL)  Vitamin D 1,25 dihydroxy     Status: None   Collection Time: 03/06/16 12:11 PM  Result Value Ref Range   Vitamin D 1, 25 (OH)2 Total 27 18 - 72 pg/mL   Vitamin D3 1, 25 (OH)2 27 pg/mL   Vitamin D2 1, 25 (OH)2 <8 pg/mL    Comment: Vitamin D3, 1,25(OH)2 indicates both endogenous production and supplementation.  Vitamin D2, 1,25(OH)2 is an indicator of exogeous sources, such as diet or supplementation.  Interpretation and therapy are based on measurement of Vitamin D,1,25(OH)2, Total. This test was developed and its analytical performance characteristics have been determined by [facility] [place], [place], [place]. It has not been cleared or approved by the FDA. This assay has been validated pursuant to the CLIA regulations and is used for clinical purposes.   LDL cholesterol, direct     Status: None   Collection Time: 03/06/16 12:11 PM  Result Value Ref Range   Direct LDL 139.0 mg/dL    Comment: Optimal:  <100 mg/dLNear or Above Optimal:  100-129 mg/dLBorderline High:  130-159 mg/dLHigh:  160-189 mg/dLVery High:  >190 mg/dL  Hemoglobin A1c     Status: Abnormal   Collection Time: 03/09/16 10:53 AM  Result Value Ref Range   Hgb A1c MFr Bld 12.5 (H) <5.7 %    Comment:   For someone without known  diabetes, a hemoglobin A1c value of 6.5% or greater indicates that they may have diabetes and this should be confirmed with a follow-up test.   For someone with known diabetes, a value <7% indicates that their diabetes is well controlled and a value greater than or equal to 7% indicates suboptimal control. A1c targets should be individualized based on duration of diabetes, age, comorbid conditions, and other considerations.   Currently, no consensus exists for use of hemoglobin A1c for diagnosis of diabetes for children.      Mean Plasma Glucose 312 mg/dL   No problem-specific Assessment & Plan notes found for this encounter.     Follow up: Return in about 1 month (around 04/03/2016) for HTN, DM, unsteady gait.  Wilfred Lacy, NP

## 2016-03-06 NOTE — Patient Instructions (Addendum)
Refer for Home PT if labs are normal.  DASH Eating Plan DASH stands for "Dietary Approaches to Stop Hypertension." The DASH eating plan is a healthy eating plan that has been shown to reduce high blood pressure (hypertension). Additional health benefits may include reducing the risk of type 2 diabetes mellitus, heart disease, and stroke. The DASH eating plan may also help with weight loss. What do I need to know about the DASH eating plan? For the DASH eating plan, you will follow these general guidelines:  Choose foods with less than 150 milligrams of sodium per serving (as listed on the food label).  Use salt-free seasonings or herbs instead of table salt or sea salt.  Check with your health care provider or pharmacist before using salt substitutes.  Eat lower-sodium products. These are often labeled as "low-sodium" or "no salt added."  Eat fresh foods. Avoid eating a lot of canned foods.  Eat more vegetables, fruits, and low-fat dairy products.  Choose whole grains. Look for the word "whole" as the first word in the ingredient list.  Choose fish and skinless chicken or [place] more often than red meat. Limit fish, poultry, and meat to 6 oz (170 g) each day.  Limit sweets, desserts, sugars, and sugary drinks.  Choose heart-healthy fats.  Eat more home-cooked food and less restaurant, buffet, and fast food.  Limit fried foods.  Do not fry foods. Cook foods using methods such as baking, boiling, grilling, and broiling instead.  When eating at a restaurant, ask that your food be prepared with less salt, or no salt if possible. What foods can I eat? Seek help from a dietitian for individual calorie needs. Grains  Whole grain or whole wheat bread. Brown rice. Whole grain or whole wheat pasta. Quinoa, bulgur, and whole grain cereals. Low-sodium cereals. Corn or whole wheat flour tortillas. Whole grain cornbread. Whole grain crackers. Low-sodium crackers. Vegetables  Fresh or frozen  vegetables (raw, steamed, roasted, or grilled). Low-sodium or reduced-sodium tomato and vegetable juices. Low-sodium or reduced-sodium tomato sauce and paste. Low-sodium or reduced-sodium canned vegetables. Fruits  All fresh, canned (in natural juice), or frozen fruits. Meat and Other Protein Products  Ground beef (85% or leaner), grass-fed beef, or beef trimmed of fat. Skinless chicken or [place]. Ground chicken or [place]. Pork trimmed of fat. All fish and seafood. Eggs. Dried beans, peas, or lentils. Unsalted nuts and seeds. Unsalted canned beans. Dairy  Low-fat dairy products, such as skim or 1% milk, 2% or reduced-fat cheeses, low-fat ricotta or cottage cheese, or plain low-fat yogurt. Low-sodium or reduced-sodium cheeses. Fats and Oils  Tub margarines without trans fats. Light or reduced-fat mayonnaise and salad dressings (reduced sodium). Avocado. Safflower, olive, or canola oils. Natural peanut or almond butter. Other  Unsalted popcorn and pretzels. The items listed above may not be a complete list of recommended foods or beverages. Contact your dietitian for more options.  What foods are not recommended? Grains  White bread. White pasta. White rice. Refined cornbread. Bagels and croissants. Crackers that contain trans fat. Vegetables  Creamed or fried vegetables. Vegetables in a cheese sauce. Regular canned vegetables. Regular canned tomato sauce and paste. Regular tomato and vegetable juices. Fruits  Canned fruit in light or heavy syrup. Fruit juice. Meat and Other Protein Products  Fatty cuts of meat. Ribs, chicken wings, bacon, sausage, bologna, salami, chitterlings, fatback, hot dogs, bratwurst, and packaged luncheon meats. Salted nuts and seeds. Canned beans with salt. Dairy  Whole or 2% milk, cream, half-and-half,  and cream cheese. Whole-fat or sweetened yogurt. Full-fat cheeses or blue cheese. Nondairy creamers and whipped toppings. Processed cheese, cheese spreads, or cheese  curds. Condiments  Onion and garlic salt, seasoned salt, table salt, and sea salt. Canned and packaged gravies. Worcestershire sauce. Tartar sauce. Barbecue sauce. Teriyaki sauce. Soy sauce, including reduced sodium. Steak sauce. Fish sauce. Oyster sauce. Cocktail sauce. Horseradish. Ketchup and mustard. Meat flavorings and tenderizers. Bouillon cubes. Hot sauce. Tabasco sauce. Marinades. Taco seasonings. Relishes. Fats and Oils  Butter, stick margarine, lard, shortening, ghee, and bacon fat. Coconut, palm kernel, or palm oils. Regular salad dressings. Other  Pickles and olives. Salted popcorn and pretzels. The items listed above may not be a complete list of foods and beverages to avoid. Contact your dietitian for more information.  Where can I find more information? National Heart, Lung, and Blood Institute: travelstabloid.com This information is not intended to replace advice given to you by your health care provider. Make sure you discuss any questions you have with your health care provider. Document Released: 01/04/2011 Document Revised: 06/23/2015 Document Reviewed: 11/19/2012 Elsevier Interactive Patient Education  2017 [employer].

## 2016-03-07 ENCOUNTER — Telehealth: Payer: Self-pay | Admitting: Nurse Practitioner

## 2016-03-07 NOTE — Telephone Encounter (Signed)
Patient states she seen [facility] yesterday and was told that she needed a company to come in and be with her throughout the day.  Patient states she does not need any help b/c she has family coming in and out of the house.

## 2016-03-08 ENCOUNTER — Telehealth: Payer: Self-pay | Admitting: Nurse Practitioner

## 2016-03-08 ENCOUNTER — Other Ambulatory Visit: Payer: Medicare Other

## 2016-03-08 DIAGNOSIS — R739 Hyperglycemia, unspecified: Secondary | ICD-10-CM

## 2016-03-08 MED ORDER — FENOFIBRATE 145 MG PO TABS: 145.0000 mg | ORAL_TABLET | Freq: Every day | ORAL | 3 refills | Status: DC

## 2016-03-08 MED ORDER — METFORMIN HCL 500 MG PO TABS: 500.0000 mg | ORAL_TABLET | Freq: Two times a day (BID) | ORAL | 3 refills | Status: DC

## 2016-03-08 NOTE — Telephone Encounter (Signed)
Inform pt that [name] put in referral for PT to help with her mobility, pt agree.

## 2016-03-08 NOTE — Telephone Encounter (Signed)
Pt called in and she would like a nurse to call her about her lab results

## 2016-03-09 ENCOUNTER — Other Ambulatory Visit: Payer: Medicare Other

## 2016-03-09 DIAGNOSIS — R7309 Other abnormal glucose: Secondary | ICD-10-CM | POA: Diagnosis not present

## 2016-03-09 LAB — VITAMIN D 1,25 DIHYDROXY
VITAMIN D 1, 25 (OH) TOTAL: 27 pg/mL (ref 18–72)
VITAMIN D3 1, 25 (OH): 27 pg/mL

## 2016-03-09 NOTE — Progress Notes (Signed)
Normal results

## 2016-03-10 LAB — HEMOGLOBIN A1C
HEMOGLOBIN A1C: 12.5 % — AB (ref <5.7)
MEAN PLASMA GLUCOSE: 312 mg/dL

## 2016-03-14 MED ORDER — BLOOD GLUCOSE MONITOR KIT: PACK | 0 refills | Status: DC

## 2016-03-14 MED ORDER — SITAGLIPTIN PHOSPHATE 100 MG PO TABS: 100.0000 mg | ORAL_TABLET | Freq: Every day | ORAL | 3 refills | Status: DC

## 2016-03-14 NOTE — Addendum Note (Signed)
Addended by: Wilfred Lacy L on: 03/14/2016 10:20 PM   Modules accepted: Orders, Level of Service

## 2016-03-27 ENCOUNTER — Ambulatory Visit: Payer: Medicare Other | Admitting: Nurse Practitioner

## 2016-04-06 ENCOUNTER — Encounter: Payer: Self-pay | Admitting: Rehabilitation

## 2016-04-06 ENCOUNTER — Ambulatory Visit: Payer: Medicare Other | Attending: Nurse Practitioner | Admitting: Rehabilitation

## 2016-04-06 DIAGNOSIS — R2681 Unsteadiness on feet: Secondary | ICD-10-CM

## 2016-04-06 DIAGNOSIS — M6281 Muscle weakness (generalized): Secondary | ICD-10-CM | POA: Diagnosis not present

## 2016-04-06 DIAGNOSIS — R2689 Other abnormalities of gait and mobility: Secondary | ICD-10-CM

## 2016-04-06 NOTE — Therapy (Signed)
[facility] [address] [place], [place], [address] Phone: [phone]   Fax:  [phone]  Physical Therapy Evaluation  Patient Details  Name: [name] MRN: [id number] Date of Birth: [birth year] Referring Provider: Flossie Buffy, NP  Encounter Date: 04/06/2016      PT End of Session - 04/06/16 1044    Visit Number 1   Number of Visits 13   Date for PT Re-Evaluation 05/21/16   Authorization Type MCR-G code every 10th visit   PT Start Time 0932   PT Stop Time 1015   PT Time Calculation (min) 43 min   Activity Tolerance Patient tolerated treatment well   Behavior During Therapy [facility] for tasks assessed/performed      Past Medical History:  Diagnosis Date  . Abdominal distension   . Anemia   . Anxiety   . B12 deficiency   . Balance problem   . Blood in stool   . Cancer (HCC)    OVARIAN  . Colon polyps   . Dementia    EARLY  . Dementia    EARLY  . Depression   . GERD (gastroesophageal reflux disease)   . History of blood transfusion   . Hypertension   . Hypomagnesemia   . Urine incontinence     Past Surgical History:  Procedure Laterality Date  . ABDOMINAL HYSTERECTOMY    . CATARACT EXTRACTION W/PHACO Right 07/26/2015   Procedure: CATARACT EXTRACTION PHACO AND INTRAOCULAR LENS PLACEMENT (IOC);  Surgeon: Birder Robson, MD;  Location: [facility] ORS;  Service: Ophthalmology;  Laterality: Right;  [name]   1:03.5 AP%  23% CDE   14.59 fluid pack lot # [id number]  . CATARACT EXTRACTION W/PHACO Left 08/29/2015   Procedure: CATARACT EXTRACTION PHACO AND INTRAOCULAR LENS PLACEMENT ([place]);  Surgeon: Birder Robson, MD;  Location: [facility] ORS;  Service: Ophthalmology;  Laterality: Left;  [name] 00:40 AP% 20.9 CDE 8.50 fluid pack lot # [id number]  . COLONOSCOPY WITH PROPOFOL N/A 03/14/2015   Procedure: COLONOSCOPY WITH PROPOFOL;  Surgeon: Hulen Luster, MD;  Location: [facility] END[facility]COPY;  Service: Gastroenterology;   Laterality: N/A;  . ESOPHAGOGASTRODUODENOSCOPY (EGD) WITH PROPOFOL N/A 03/14/2015   Procedure: ESOPHAGOGASTRODUODENOSCOPY (EGD) WITH PROPOFOL;  Surgeon: Hulen Luster, MD;  Location: [facility] ENDOSCOPY;  Service: Gastroenterology;  Laterality: N/A;  . none    . TOTAL ABDOMINAL HYSTERECTOMY W/ BILATERAL SALPINGOOPHORECTOMY  04/19/2015   exploratory laparoscopy, total abdominal hysterectomy, bilateral salping oophorectomy, left pelvic lymph node dissection, omentectomy    There were no vitals filed for this visit.       Subjective Assessment - 04/06/16 0935    Subjective "I don't like my gait."  "Its unsteady especially when I first get up.  I have to use this cane when I"m out but I don't use the cane in the house.  I use the wall."    Limitations House hold activities;Walking   How long can you walk comfortably? up to 5 mins at a time   Patient Stated Goals "To be more steady on my feet."    Currently in Pain? No/denies            [facility] PT Assessment - 04/06/16 6384      Assessment   Medical Diagnosis gait imbalance   Referring Provider Flossie Buffy, NP   Onset Date/Surgical Date --  back in December is when my balance got worse     Precautions   Precautions Fall     Restrictions  Weight Bearing Restrictions No     Balance Screen   Has the patient fallen in the past 6 months No   Has the patient had a decrease in activity level because of a fear of falling?  No   Is the patient reluctant to leave their home because of a fear of falling?  No     Home Social worker Private residence   Living Arrangements Non-relatives/Friends;Children  spiritual leader, children   Available Help at Discharge Family;Friend(s);Available PRN/intermittently   Type of Home House   Home Access Stairs to enter   Entrance Stairs-Number of Steps 2   Entrance Stairs-Rails Left;Right;Cannot reach both   Home Layout One level   Winter - single point     Prior  Function   Level of Independence Independent  uses electric cart when grocery shopping   Vocation Retired   Leisure watch movies, grocery shop     Cognition   Overall Cognitive Status Within Functional Limits for tasks assessed     Sensation   Light Touch Appears Intact   Hot/Cold Appears Intact   Proprioception Appears Intact     Coordination   Gross Motor Movements are Fluid and Coordinated Yes   Fine Motor Movements are Fluid and Coordinated Yes   Heel Shin Test [facility]     Posture/Postural Control   Posture/Postural Control Postural limitations   Postural Limitations Rounded Shoulders;Forward head;Decreased lumbar lordosis     ROM / Strength   AROM / PROM / Strength Strength     Strength   Overall Strength Within functional limits for tasks performed   Overall Strength Comments all tested in seated position.  likely some hip weakness due to gait deviations and unsteadiness     Transfers   Transfers Sit to Stand;Stand to Sit   Sit to Stand 7: Independent   Five time sit to stand comments  12.87 secs without UE support   Stand to Sit 7: Independent     Ambulation/Gait   Ambulation/Gait Yes   Ambulation/Gait Assistance 5: Supervision;6: Modified independent (Device/Increase time);4: Min guard   Ambulation/Gait Assistance Details S without use of cane, min/guard without cane and with balance challenges during DGI    Ambulation Distance (Feet) 345 Feet   Assistive device Rolling walker   Gait Pattern Step-through pattern;Decreased arm swing - right;Decreased arm swing - left;Decreased stride length;Trunk flexed;Wide base of support   Ambulation Surface Level;Indoor   Gait velocity 1.87 ft/sec with SPC   Stairs Yes   Stairs Assistance 5: Supervision   Stairs Assistance Details (indicate cue type and reason) Had her perform how she normally would in community and at home.  She performed in step to fashion, but was able to perform in alternating fashion when asked.    Stair  Management Technique With cane;One rail Left;Alternating pattern;Step to pattern;Forwards   Number of Stairs 4  x 2 reps   Height of Stairs 6     Standardized Balance Assessment   Standardized Balance Assessment Dynamic Gait Index     Dynamic Gait Index   Level Surface Normal   Change in Gait Speed Mild Impairment   Gait with Horizontal Head Turns Mild Impairment   Gait with Vertical Head Turns Moderate Impairment   Gait and Pivot Turn Mild Impairment   Step Over Obstacle Mild Impairment   Step Around Obstacles Mild Impairment   Steps Mild Impairment   Total Score 16   DGI comment: Scores of 19 or  less are predictive of falls in older community living adults                           PT Education - 04/06/16 1043    Education provided Yes   Education Details evaluation findings, POC, goals, starting walking program for endurance   Person(s) Educated Patient   Methods Explanation   Comprehension Verbalized understanding          PT Short Term Goals - 04/06/16 1050      PT SHORT TERM GOAL #1   Title Pt will initiate HEP in order to indicate improved functional mobility and balance.  (Target Date: 04/20/16)   Time 2   Period Weeks   Status New     PT SHORT TERM GOAL #2   Title Will assess 6MWT and improve distance by 6' in order to indicate improved functional endurance.     Time 2   Period Weeks   Status New     PT SHORT TERM GOAL #3   Title Pt will report return to walking on track across street with S from family/friends in order to indicate return to community fitness.    Time 2   Period Weeks   Status New           PT Long Term Goals - 04/06/16 1052      PT LONG TERM GOAL #1   Title Pt will be independent with final HEP in order to indicate improved functional mobility and balance.  (Target Date: 05/18/16)   Time 6   Period Weeks   Status New     PT LONG TERM GOAL #2   Title Pt will improve DGI to >19/24 in order to indicate  decreased fall risk.     Time 6   Period Weeks   Status New     PT LONG TERM GOAL #3   Title Pt will improve gait speed to 2.47 ft/sec w/ LRAD in order to indicate decreased fall risk and improved efficiency of gait.     Time 6   Period Weeks   Status New     PT LONG TERM GOAL #4   Title Pt will improve 6MWT by 150' from baseline in order to indicate improved functional endurance.     Time 6   Period Weeks   Status New     PT LONG TERM GOAL #5   Title Pt will report beginning to ambulate for short shopping trips with use of cane and/or shopping cart in order to maintain endurance gained in therapy.     Time 6   Period Weeks   Status New               Plan - 04/06/16 1045    Clinical Impression Statement Pt presents with decreased balance, generalized weakness and poor endurance for up to 8 months, however noted a significant decline in December 2017.  Note history of DM (she was unaware but was in her history), HTN, and decreased vision that could impact therapy.  Upon PT evaluation, note gait speed is 1.87 ft/sec with cane, DGI score of 16/24 both indicative of increased fall risk and limited community mobility.  Note following DGI testing, pt with audible dyspnea.  She reports feeling "a little winded."  Pt is of evolving presentation and moderate complexity.  She will benefit from skilled OP neuro in order to address deficits.     Rehab Potential  Good   PT Frequency 2x / week   PT Duration 6 weeks   PT Treatment/Interventions ADLs/Self Care Home Management;DME Instruction;Gait training;Stair training;Functional mobility training;Therapeutic activities;Therapeutic exercise;Balance training;Neuromuscular re-education;Patient/family education;Energy conservation;Vestibular   PT Next Visit Plan 6MWT, initiate HEP for functional strength/BLE, esp hip, high level balance, endurance, gait with and without cane to challenge balance.    PT Home Exercise Plan verbally initiated  walking program during eval   Consulted and Agree with Plan of Care Patient      Patient will benefit from skilled therapeutic intervention in order to improve the following deficits and impairments:  Decreased activity tolerance, Decreased balance, Decreased endurance, Decreased knowledge of precautions, Decreased knowledge of use of DME, Decreased strength, Impaired perceived functional ability, Impaired flexibility, Improper body mechanics, Postural dysfunction, Obesity  Visit Diagnosis: Unsteadiness on feet - Plan: PT plan of care cert/re-cert  Muscle weakness (generalized) - Plan: PT plan of care cert/re-cert  Other abnormalities of gait and mobility - Plan: PT plan of care cert/re-cert      G-Codes - 98/26/41 1056    Functional Assessment Tool Used (Outpatient Only) DGI: 16/24   Functional Limitation Mobility: Walking and moving around   Mobility: Walking and Moving Around Current Status [phone]) At least 20 percent but less than 40 percent impaired, limited or restricted   Mobility: Walking and Moving Around Goal Status [phone]) At least 1 percent but less than 20 percent impaired, limited or restricted       Problem List Patient Active Problem List   Diagnosis Date Noted  . Vitamin D deficiency 03/06/2016  . 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 Essential (primary) hypertension 03/21/2015  . Vision decreased 03/21/2015  . Tension-type headache, not intractable 03/21/2015  . Anemia due to chronic blood loss 03/16/2015  . Morbid obesity ([place]) 02/02/2015  . Anemia 01/30/2015   Cameron Sprang, PT, MPT [facility] [address] [place], [place], [address] Phone: [phone]   Fax:  [phone] 04/06/16, 10:59 AM  Name: [name] MRN: [id number] Date of Birth: [birth year]

## 2016-04-10 ENCOUNTER — Ambulatory Visit: Payer: Medicare Other | Admitting: Podiatry

## 2016-04-11 ENCOUNTER — Encounter: Payer: Self-pay | Admitting: Physical Therapy

## 2016-04-11 ENCOUNTER — Ambulatory Visit: Payer: Medicare Other | Admitting: Physical Therapy

## 2016-04-11 DIAGNOSIS — M6281 Muscle weakness (generalized): Secondary | ICD-10-CM

## 2016-04-11 DIAGNOSIS — R2689 Other abnormalities of gait and mobility: Secondary | ICD-10-CM

## 2016-04-11 DIAGNOSIS — R2681 Unsteadiness on feet: Secondary | ICD-10-CM | POA: Diagnosis not present

## 2016-04-11 NOTE — Patient Instructions (Addendum)
Tandem Stance    Right foot in front of left, heel touching toe both feet "straight ahead". Stand on Foot Triangle of Support with both feet. Balance in this position __30_ seconds. Do with left foot in front of right. Try to perform x2  Copyright  VHI. All rights reserved.

## 2016-04-11 NOTE — Therapy (Signed)
[facility] [address] [place], [place], [address] Phone: [phone]   Fax:  [phone]  Physical Therapy Treatment  Patient Details  Name: [name] MRN: [id number] Date of Birth: [birth year] Referring Provider: Flossie Buffy, NP  Encounter Date: 04/11/2016      PT End of Session - 04/11/16 1258    Visit Number 2   Number of Visits 13   Date for PT Re-Evaluation 05/21/16   Authorization Type MCR-G code every 10th visit   PT Start Time 1150   PT Stop Time 1228   PT Time Calculation (min) 38 min   Activity Tolerance Patient limited by fatigue   Behavior During Therapy [facility] for tasks assessed/performed      Past Medical History:  Diagnosis Date  . Abdominal distension   . Anemia   . Anxiety   . B12 deficiency   . Balance problem   . Blood in stool   . Cancer (HCC)    OVARIAN  . Colon polyps   . Dementia    EARLY  . Dementia    EARLY  . Depression   . GERD (gastroesophageal reflux disease)   . History of blood transfusion   . Hypertension   . Hypomagnesemia   . Urine incontinence     Past Surgical History:  Procedure Laterality Date  . ABDOMINAL HYSTERECTOMY    . CATARACT EXTRACTION W/PHACO Right 07/26/2015   Procedure: CATARACT EXTRACTION PHACO AND INTRAOCULAR LENS PLACEMENT (IOC);  Surgeon: Birder Robson, MD;  Location: [facility] ORS;  Service: Ophthalmology;  Laterality: Right;  [name]   1:03.5 AP%  23% CDE   14.59 fluid pack lot # [id number]  . CATARACT EXTRACTION W/PHACO Left 08/29/2015   Procedure: CATARACT EXTRACTION PHACO AND INTRAOCULAR LENS PLACEMENT ([place]);  Surgeon: Birder Robson, MD;  Location: [facility] ORS;  Service: Ophthalmology;  Laterality: Left;  [name] 00:40 AP% 20.9 CDE 8.50 fluid pack lot # [id number]  . COLONOSCOPY WITH PROPOFOL N/A 03/14/2015   Procedure: COLONOSCOPY WITH PROPOFOL;  Surgeon: Hulen Luster, MD;  Location: [facility] ENDOSCOPY;  Service: Gastroenterology;   Laterality: N/A;  . ESOPHAGOGASTRODUODENOSCOPY (EGD) WITH PROPOFOL N/A 03/14/2015   Procedure: ESOPHAGOGASTRODUODENOSCOPY (EGD) WITH PROPOFOL;  Surgeon: Hulen Luster, MD;  Location[facility] ENDOSCOPY;  Service: Gastroenterology;  Laterality: N/A;  . none    . TOTAL ABDOMINAL HYSTERECTOMY W/ BILATERAL SALPINGOOPHORECTOMY  04/19/2015   exploratory laparoscopy, total abdominal hysterectomy, bilateral salping oophorectomy, left pelvic lymph node dissection, omentectomy    There were no vitals filed for this visit.      Subjective Assessment - 04/11/16 1153    Subjective Pt walked around a track (1 Lap) since last visit, is trying to use the cane more inside vs "furniture walking", and used the grocery cart vs. scooter. " I just took my time, very slow."   Currently in Pain? No/denies                         [facility] Adult PT Treatment/Exercise - 04/11/16 0001      Ambulation/Gait   Ambulation/Gait Yes   Ambulation/Gait Assistance 6: Modified independent (Device/Increase time)   Ambulation/Gait Assistance Details 6MWT, 1,066ft with 1 seated rest break, no LOB   Ambulation Distance (Feet) 1000 Feet   Assistive device Straight cane   Gait Pattern Step-through pattern;Decreased arm swing - right;Decreased arm swing - left;Decreased stride length;Trunk flexed;Wide base of support  Balance Exercises - 04/11/16 1216      Balance Exercises: Standing   Tandem Stance Eyes open;Upper extremity support 2;2 reps           PT Education - 04/11/16 1303    Education provided Yes   Education Details Encouraged pt to follow up with MD due to needing to fill out BP prescription; BP at beginning of session was 126/92.  Initiated HEP for standing balance. Discussed walking activities pt performed since last session.     Person(s) Educated Patient   Methods Explanation;Demonstration   Comprehension Verbalized understanding;Returned demonstration;Verbal cues required;Need  further instruction          PT Short Term Goals - 04/06/16 1050      PT SHORT TERM GOAL #1   Title Pt will initiate HEP in order to indicate improved functional mobility and balance.  (Target Date: 04/20/16)   Time 2   Period Weeks   Status New     PT SHORT TERM GOAL #2   Title Will assess 6MWT and improve distance by 24' in order to indicate improved functional endurance.     Time 2   Period Weeks   Status New     PT SHORT TERM GOAL #3   Title Pt will report return to walking on track across street with S from family/friends in order to indicate return to community fitness.    Time 2   Period Weeks   Status New           PT Long Term Goals - 04/06/16 1052      PT LONG TERM GOAL #1   Title Pt will be independent with final HEP in order to indicate improved functional mobility and balance.  (Target Date: 05/18/16)   Time 6   Period Weeks   Status New     PT LONG TERM GOAL #2   Title Pt will improve DGI to >19/24 in order to indicate decreased fall risk.     Time 6   Period Weeks   Status New     PT LONG TERM GOAL #3   Title Pt will improve gait speed to 2.47 ft/sec w/ LRAD in order to indicate decreased fall risk and improved efficiency of gait.     Time 6   Period Weeks   Status New     PT LONG TERM GOAL #4   Title Pt will improve 6MWT by 150' from baseline in order to indicate improved functional endurance.     Time 6   Period Weeks   Status New     PT LONG TERM GOAL #5   Title Pt will report beginning to ambulate for short shopping trips with use of cane and/or shopping cart in order to maintain endurance gained in therapy.     Time 6   Period Weeks   Status New               Plan - 04/11/16 1259    Clinical Impression Statement Pt verbalized understanding and follow through with walking recommendations from last visit.  Pt required 1 seated rest break during 6 MWT and used SPC appropriately.  Pt required intermittent to 2 UE support for  tandem stance and fatigued quickly asking to end session early, although seated rest breaks were given.  Rehab Potential Good   PT Frequency 2x / week   PT Duration 6 weeks   PT Treatment/Interventions ADLs/Self Care Home Management;DME Instruction;Gait training;Stair training;Functional mobility training;Therapeutic activities;Therapeutic exercise;Balance training;Neuromuscular re-education;Patient/family education;Energy conservation;Vestibular   PT Next Visit Plan Add 6MWT goal, progress HEP for functional strength/BLE, esp hip, high level balance, endurance, gait with and without cane to challenge balance.    PT Home Exercise Plan verbally initiated walking program during eval   Consulted and Agree with Plan of Care Patient      Patient will benefit from skilled therapeutic intervention in order to improve the following deficits and impairments:  Decreased activity tolerance, Decreased balance, Decreased endurance, Decreased knowledge of precautions, Decreased knowledge of use of DME, Decreased strength, Impaired perceived functional ability, Impaired flexibility, Improper body mechanics, Postural dysfunction, Obesity  Visit Diagnosis: Unsteadiness on feet  Muscle weakness (generalized)  Other abnormalities of gait and mobility     Problem List Patient Active Problem List   Diagnosis Date Noted  . Vitamin D deficiency 03/06/2016  . 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 Essential (primary) hypertension 03/21/2015  . Vision decreased 03/21/2015  . Tension-type headache, not intractable 03/21/2015  . Anemia due to chronic blood loss 03/16/2015  . Morbid obesity ([place]) 02/02/2015  . Anemia 01/30/2015    Bjorn Loser, PTA  04/11/16, 1:08 PM [facility] [address] [place], [place], [address] Phone: [phone]   Fax:  [phone]  Name: Reynalda Canny MRN:  [id number] Date of Birth: 1940/07/24

## 2016-04-12 ENCOUNTER — Ambulatory Visit (INDEPENDENT_AMBULATORY_CARE_PROVIDER_SITE_OTHER): Payer: Medicare Other | Admitting: Nurse Practitioner

## 2016-04-12 ENCOUNTER — Encounter: Payer: Self-pay | Admitting: Nurse Practitioner

## 2016-04-12 VITALS — BP 132/80 | HR 84 | Temp 98.4°F | Ht 64.5 in | Wt 197.0 lb

## 2016-04-12 DIAGNOSIS — Z794 Long term (current) use of insulin: Secondary | ICD-10-CM

## 2016-04-12 DIAGNOSIS — I10 Essential (primary) hypertension: Secondary | ICD-10-CM

## 2016-04-12 DIAGNOSIS — E118 Type 2 diabetes mellitus with unspecified complications: Secondary | ICD-10-CM | POA: Diagnosis not present

## 2016-04-12 DIAGNOSIS — D649 Anemia, unspecified: Secondary | ICD-10-CM | POA: Diagnosis not present

## 2016-04-12 DIAGNOSIS — E782 Mixed hyperlipidemia: Secondary | ICD-10-CM

## 2016-04-12 DIAGNOSIS — E1165 Type 2 diabetes mellitus with hyperglycemia: Secondary | ICD-10-CM

## 2016-04-12 DIAGNOSIS — E1142 Type 2 diabetes mellitus with diabetic polyneuropathy: Secondary | ICD-10-CM | POA: Insufficient documentation

## 2016-04-12 MED ORDER — ONETOUCH DELICA LANCETS 33G MISC: 1.0000 "application " | Freq: Two times a day (BID) | 3 refills | Status: DC

## 2016-04-12 MED ORDER — PEN NEEDLES 31G X 8 MM MISC: 1.0000 "application " | Freq: Every day | 3 refills | Status: DC

## 2016-04-12 MED ORDER — FENOFIBRATE 145 MG PO TABS: 145.0000 mg | ORAL_TABLET | Freq: Every day | ORAL | 3 refills | Status: DC

## 2016-04-12 MED ORDER — SITAGLIP PHOS-METFORMIN HCL ER 50-1000 MG PO TB24: 1.0000 | ORAL_TABLET | Freq: Every day | ORAL | 0 refills | Status: DC

## 2016-04-12 MED ORDER — INSULIN GLARGINE 100 UNITS/ML SOLOSTAR PEN: 10.0000 [IU] | PEN_INJECTOR | Freq: Every day | SUBCUTANEOUS | 0 refills | Status: DC

## 2016-04-12 MED ORDER — GLUCOSE BLOOD VI STRP: ORAL_STRIP | 3 refills | Status: DC

## 2016-04-12 MED ORDER — LISINOPRIL 5 MG PO TABS: 5.0000 mg | ORAL_TABLET | Freq: Every day | ORAL | 1 refills | Status: DC

## 2016-04-12 MED ORDER — BLOOD GLUCOSE MONITOR KIT: PACK | 0 refills | Status: DC

## 2016-04-12 NOTE — Progress Notes (Signed)
Subjective:  Patient ID: [name], female    DOB: Feb 18, 1940  Age: 76 y.o. MRN: [id number]  CC: Follow-up (follow up on DM/hyperlipidemia/req refill for hydrochlorothiazide?)   Diabetes  She presents for her follow-up diabetic visit. She has type 2 diabetes mellitus. No MedicAlert identification noted. Her disease course has been worsening. Pertinent negatives for hypoglycemia include no confusion, dizziness, headaches, hunger, mood changes, nervousness/anxiousness, pallor, seizures, sleepiness, speech difficulty, sweats or tremors. Associated symptoms include fatigue, weakness and weight loss. Pertinent negatives for diabetes include no blurred vision, no chest pain, no foot paresthesias, no foot ulcerations, no polydipsia, no polyphagia, no polyuria and no visual change. There are no hypoglycemic complications. Symptoms are stable. Diabetic complications include heart disease. Risk factors for coronary artery disease include diabetes mellitus, dyslipidemia, family history, obesity, hypertension, sedentary lifestyle and post-menopausal. Current diabetic treatment includes oral agent (dual therapy) and insulin injections. She is compliant with treatment none of the time (due to lack of money). She is following a generally healthy diet. She has not had a previous visit with a dietitian. She never participates in exercise. Home blood sugar record trend: does not check glucose. She does not see a podiatrist.Eye exam is not current.   HTN: controlled but low sodium and chloride.  Outpatient Medications Prior to Visit  Medication Sig Dispense Refill  . calcium carbonate (TUMS - DOSED IN MG ELEMENTAL CALCIUM) 500 MG chewable tablet Chew 1 tablet by mouth every 6 (six) hours as needed for indigestion or heartburn.    . cholecalciferol (VITAMIN D) 400 units TABS tablet Take 1,000 Units by mouth daily.    . citalopram (CELEXA) 10 MG tablet Take 10 mg by mouth daily.    Marland Kitchen ibuprofen  (ADVIL,MOTRIN) 600 MG tablet Take 600 mg by mouth every 6 (six) hours as needed.    . hydrochlorothiazide (MICROZIDE) 12.5 MG capsule Take 1 capsule (12.5 mg total) by mouth daily. 90 capsule 0  . sitaGLIPtin (JANUVIA) 100 MG tablet Take 1 tablet (100 mg total) by mouth daily. 30 tablet 3  . vitamin B-12 (CYANOCOBALAMIN) 1000 MCG tablet Take 1,000 mcg by mouth daily.    . ondansetron (ZOFRAN) 4 MG tablet Take 4 mg by mouth every 8 (eight) hours as needed for nausea or vomiting.    . blood glucose meter kit and supplies KIT Dispense based on patient and insurance preference. Use up to three times daily before meals. (FOR ICD-9 250.00, 250.01). (Patient not taking: Reported on 04/12/2016) 1 each 0  . fenofibrate (TRICOR) 145 MG tablet Take 1 tablet (145 mg total) by mouth daily. (Patient not taking: Reported on 04/06/2016) 30 tablet 3  . magnesium oxide (MAG-OX) 400 MG tablet Take 400 mg by mouth daily.    . metFORMIN (GLUCOPHAGE) 500 MG tablet Take 1 tablet (500 mg total) by mouth 2 (two) times daily with a meal. (Patient not taking: Reported on 04/12/2016) 60 tablet 3   No facility-administered medications prior to visit.     ROS See HPI  Objective:  BP 132/80   Pulse 84   Temp 98.4 F (36.9 C)   Ht 5' 4.5" (1.638 m)   Wt 197 lb (89.4 kg)   SpO2 97%   BMI 33.29 kg/m   BP Readings from Last 3 Encounters:  04/12/16 132/80  03/06/16 (!) 144/82  02/03/16 (!) 150/78    Wt Readings from Last 3 Encounters:  04/12/16 197 lb (89.4 kg)  03/06/16 219 lb (99.3 kg)  02/03/16 225 lb (  102.1 kg)    Physical Exam  Constitutional: She is oriented to person, place, and time. No distress.  Neck: Normal range of motion. Neck supple. No thyromegaly present.  Cardiovascular: Normal rate, regular rhythm and normal heart sounds.   Pulmonary/Chest: Effort normal and breath sounds normal.  Abdominal: Soft. She exhibits no distension. There is no tenderness.  Musculoskeletal: She exhibits no edema.    Lymphadenopathy:    She has no cervical adenopathy.  Neurological: She is alert and oriented to person, place, and time.  Skin: Skin is warm and dry.  Vitals reviewed.   Lab Results  Component Value Date   WBC 8.2 03/06/2016   HGB 15.5 (H) 03/06/2016   HCT 45.0 03/06/2016   PLT 340.0 03/06/2016   GLUCOSE 476 (H) 03/06/2016   CHOL 235 (H) 03/06/2016   TRIG 269.0 (H) 03/06/2016   HDL 54.40 03/06/2016   LDLDIRECT 139.0 03/06/2016   ALT 13 (L) 01/30/2015   AST 16 01/30/2015   NA 131 (L) 03/06/2016   K 4.2 03/06/2016   CL 94 (L) 03/06/2016   CREATININE 0.96 03/06/2016   BUN 19 03/06/2016   CO2 29 03/06/2016   INR 1.1 07/26/2013   HGBA1C 12.5 (H) 03/09/2016    No results found.  Assessment & Plan:   [name] was seen today for follow-up.  Diagnoses and all orders for this visit:  Type 2 diabetes mellitus with complication, with long-term current use of insulin (HCC) -     SitaGLIPtin-MetFORMIN HCl 50-1000 MG TB24; Take 1 tablet by mouth daily after lunch. -     Insulin Pen Needle (PEN NEEDLES) 31G X 8 MM MISC; 1 application by Does not apply route at bedtime. -     ONETOUCH DELICA LANCETS 84O MISC; 1 application by Does not apply route 2 (two) times daily before a meal. -     glucose blood test strip; Onetouch verio glucometer:Check glucose twice a day (before breakfast and at bedtime). -     insulin glargine (LANTUS) 100 unit/mL SOPN; Inject 0.1 mLs (10 Units total) into the skin at bedtime. -     blood glucose meter kit and supplies KIT; Dispense based on patient and insurance preference. Use up to three times daily before meals. (FOR ICD-9 250.00, 250.01). Patient given OneTouch Verio glucometer in office. -     Ambulatory referral to diabetic education  Essential hypertension -     lisinopril (PRINIVIL,ZESTRIL) 5 MG tablet; Take 1 tablet (5 mg total) by mouth daily.  Mixed hyperlipidemia -     fenofibrate (TRICOR) 145 MG tablet; Take 1 tablet (145 mg total) by mouth  daily.  Anemia, unspecified type   I have discontinued Ms. Holaday's magnesium oxide, hydrochlorothiazide, vitamin B-12, metFORMIN, and sitaGLIPtin. I have also changed her blood glucose meter kit and supplies. Additionally, I am having her start on SitaGLIPtin-MetFORMIN HCl, Pen Needles, ONETOUCH DELICA LANCETS 96E, glucose blood, insulin glargine, and lisinopril. Lastly, I am having her maintain her calcium carbonate, ondansetron, cholecalciferol, citalopram, ibuprofen, and fenofibrate.  Meds ordered this encounter  Medications  . SitaGLIPtin-MetFORMIN HCl 50-1000 MG TB24    Sig: Take 1 tablet by mouth daily after lunch.    Dispense:  60 tablet    Refill:  0    Order Specific Question:   Supervising Provider    Answer:   Cassandria Anger [[id number]]  . Insulin Pen Needle (PEN NEEDLES) 31G X 8 MM MISC    Sig: 1 application by Does not apply  route at bedtime.    Dispense:  100 each    Refill:  3    Order Specific Question:   Supervising Provider    Answer:   Cassandria Anger [[id number]]  . ONETOUCH DELICA LANCETS 56H MISC    Sig: 1 application by Does not apply route 2 (two) times daily before a meal.    Dispense:  100 each    Refill:  3    Order Specific Question:   Supervising Provider    Answer:   Cassandria Anger [[id number]]  . glucose blood test strip    Sig: Onetouch verio glucometer:Check glucose twice a day (before breakfast and at bedtime).    Dispense:  100 each    Refill:  3    Order Specific Question:   Supervising Provider    Answer:   Cassandria Anger [[id number]]  . insulin glargine (LANTUS) 100 unit/mL SOPN    Sig: Inject 0.1 mLs (10 Units total) into the skin at bedtime.    Dispense:  15 mL    Refill:  0    Order Specific Question:   Supervising Provider    Answer:   Cassandria Anger [[id number]]  . blood glucose meter kit and supplies KIT    Sig: Dispense based on patient and insurance preference. Use up to three times daily before meals. (FOR ICD-9 250.00,  250.01). Patient given OneTouch Verio glucometer in office.    Dispense:  1 each    Refill:  0    Order Specific Question:   Supervising Provider    Answer:   Cassandria Anger [[id number]]    Order Specific Question:   Number of strips    Answer:   100    Order Specific Question:   Number of lancets    Answer:   100  . lisinopril (PRINIVIL,ZESTRIL) 5 MG tablet    Sig: Take 1 tablet (5 mg total) by mouth daily.    Dispense:  90 tablet    Refill:  1    Order Specific Question:   Supervising Provider    Answer:   Cassandria Anger [[id number]]  . fenofibrate (TRICOR) 145 MG tablet    Sig: Take 1 tablet (145 mg total) by mouth daily.    Dispense:  30 tablet    Refill:  3    Order Specific Question:   Supervising Provider    Answer:   Cassandria Anger [[id number]]    Follow-up: Return in about 1 week (around 04/19/2016) for DM and HTN.  Wilfred Lacy, NP

## 2016-04-12 NOTE — Progress Notes (Signed)
Pre visit review using our clinic review tool, if applicable. No additional management support is needed unless otherwise documented below in the visit note. 

## 2016-04-12 NOTE — Assessment & Plan Note (Signed)
Stop HCTZ due to electrolyte imbalance. Start lisinopril 5mg  for HTN and renal protection due to new DM diagnosis.  BMP Latest Ref Rng & Units 03/06/2016 04/05/2015 03/21/2015  Glucose 70 - 99 mg/dL 476(H) 115(H) 95  BUN 6 - 23 mg/dL 19 12 8   Creatinine 0.40 - 1.20 mg/dL 0.96 0.69 0.70  Sodium 135 - 145 mEq/L 131(L) 135 135  Potassium 3.5 - 5.1 mEq/L 4.2 3.6 4.4  Chloride 96 - 112 mEq/L 94(L) 101 101  CO2 19 - 32 mEq/L 29 25 26   Calcium 8.4 - 10.5 mg/dL 10.2 9.1 9.3

## 2016-04-12 NOTE — Assessment & Plan Note (Addendum)
Hgba1c of 12.5. Prescribed metformin, januvia and lantus. Due to low income, she was unable to start medications as prescribed.  She was provided samples of janumet and lantus today x 30days. She was aslo given Glucometer kit today. Educated patient on use of glucometer and lantus pen. She verbalized understanding by return demonstration. She was advised to contact [place] assistance program. f/up in 1week.

## 2016-04-12 NOTE — Assessment & Plan Note (Signed)
CBC Latest Ref Rng & Units 03/06/2016 04/05/2015 03/01/2015  WBC 4.0 - 10.5 K/uL 8.2 9.3 7.3  Hemoglobin 12.0 - 15.0 g/dL 15.5(H) 13.2 10.1(L)  Hematocrit 36.0 - 46.0 % 45.0 38.5 32.5(L)  Platelets 150.0 - 400.0 K/uL 340.0 303 489(H)

## 2016-04-12 NOTE — Patient Instructions (Addendum)
Bring glucose readings to next OV.  You will contacted when forms are completed (Handicap placard and [employer] bus).  She was provided with information about [place] program.  Hypoglycemia Hypoglycemia occurs when the level of sugar (glucose) in the blood is too low. Glucose is a type of sugar that provides the body's main source of energy. Certain hormones (insulin and glucagon) control the level of glucose in the blood. Insulin lowers blood glucose, and glucagon increases blood glucose. Hypoglycemia can result from having too much insulin in the bloodstream, or from not eating enough food that contains glucose. Hypoglycemia can happen in people who do or do not have diabetes. It can develop quickly, and it can be a medical emergency. What are the causes? Hypoglycemia occurs most often in people who have diabetes. If you have diabetes, hypoglycemia may be caused by:  Diabetes medicine.  Not eating enough, or not eating often enough.  Increased physical activity.  Drinking alcohol, especially when you have not eaten recently. If you do not have diabetes, hypoglycemia may be caused by:  A tumor in the pancreas. The pancreas is the organ that makes insulin.  Not eating enough, or not eating for long periods at a time (fasting).  Severe infection or illness that affects the liver, heart, or kidneys.  Certain medicines. You may also have reactive hypoglycemia. This condition causes hypoglycemia within 4 hours of eating a meal. This may occur after having stomach surgery. Sometimes, the cause of reactive hypoglycemia is not known. What increases the risk? Hypoglycemia is more likely to develop in:  People who have diabetes and take medicines to lower blood glucose.  People who abuse alcohol.  People who have a severe illness. What are the signs or symptoms? Hypoglycemia may not cause any symptoms. If you have symptoms, they may  include:  Hunger.  Anxiety.  Sweating and feeling clammy.  Confusion.  Dizziness or feeling light-headed.  Sleepiness.  Nausea.  Increased heart rate.  Headache.  Blurry vision.  Seizure.  Nightmares.  Tingling or numbness around the mouth, lips, or tongue.  A change in speech.  Decreased ability to concentrate.  A change in coordination.  Restless sleep.  Tremors or shakes.  Fainting.  Irritability. How is this diagnosed? Hypoglycemia is diagnosed with a blood test to measure your blood glucose level. This blood test is done while you are having symptoms. Your health care provider may also do a physical exam and review your medical history. If you do not have diabetes, other tests may be done to find the cause of your hypoglycemia. How is this treated? This condition can often be treated by immediately eating or drinking something that contains glucose, such as:  3-4 sugar tablets (glucose pills).  Glucose gel, 15-gram tube.  Fruit juice, 4 oz (120 mL).  Regular soda (not diet soda), 4 oz (120 mL).  Low-fat milk, 4 oz (120 mL).  Several pieces of hard candy.  Sugar or honey, 1 Tbsp. Treating Hypoglycemia If You Have Diabetes   If you are alert and able to swallow safely, follow the 15:15 rule:  Take 15 grams of a rapid-acting carbohydrate. Rapid-acting options include:  1 tube of glucose gel.  3 glucose pills.  6-8 pieces of hard candy.  4 oz (120 mL) of fruit juice.  4 oz (120 ml) of regular (not diet) soda.  Check your blood glucose 15 minutes after you take the carbohydrate.  If the repeat blood glucose level is still  at or below 70 mg/dL (3.9 mmol/L), take 15 grams of a carbohydrate again.  If your blood glucose level does not increase above 70 mg/dL (3.9 mmol/L) after 3 tries, seek emergency medical care.  After your blood glucose level returns to normal, eat a meal or a snack within 1 hour. Treating Severe Hypoglycemia  Severe  hypoglycemia is when your blood glucose level is at or below 54 mg/dL (3 mmol/L). Severe hypoglycemia is an emergency. Do not wait to see if the symptoms will go away. Get medical help right away. Call your local emergency services (911 in the [place] Do not drive yourself to the hospital. If you have severe hypoglycemia and you cannot eat or drink, you may need an injection of glucagon. A family member or close friend should learn how to check your blood glucose and how to give you a glucagon injection. Ask your health care provider if you need to have an emergency glucagon injection kit available. Severe hypoglycemia may need to be treated in a hospital. The treatment may include getting glucose through an IV tube. You may also need treatment for the cause of your hypoglycemia. Follow these instructions at home: General instructions   Avoid any diets that cause you to not eat enough food. Talk with your health care provider before you start any new diet.  Take over-the-counter and prescription medicines only as told by your health care provider.  Limit alcohol intake to no more than 1 drink per day for nonpregnant women and 2 drinks per day for men. One drink equals 12 oz of beer, 5 oz of wine, or 1 oz of hard liquor.  Keep all follow-up visits as told by your health care provider. This is important. If You Have Diabetes:    Make sure you know the symptoms of hypoglycemia.  Always have a rapid-acting carbohydrate snack with you to treat low blood sugar.  Follow your diabetes management plan, as told by your health care provider. Make sure you:  Take your medicines as directed.  Follow your exercise plan.  Follow your meal plan. Eat on time, and do not skip meals.  Check your blood glucose as often as directed. Make sure to check your blood glucose before and after exercise. If you exercise longer or in a different way than usual, check your blood glucose more often.  Follow your sick  day plan whenever you cannot eat or drink normally. Make this plan in advance with your health care provider.  Share your diabetes management plan with people in your workplace, school, and household.  Check your urine for ketones when you are ill and as told by your health care provider.  Carry a medical alert card or wear medical alert jewelry. If You Have Reactive Hypoglycemia or Low Blood Sugar From Other Causes:   Monitor your blood glucose as told by your health care provider.  Follow instructions from your health care provider about eating or drinking restrictions. Contact a health care provider if:  You have problems keeping your blood glucose in your target range.  You have frequent episodes of hypoglycemia. Get help right away if:  You continue to have hypoglycemia symptoms after eating or drinking something containing glucose.  Your blood glucose is at or below 54 mg/dL (3 mmol/L).  You have a seizure.  You faint. These symptoms may represent a serious problem that is an emergency. Do not wait to see if the symptoms will go away. Get medical help right away.  Call your local emergency services (911 in the [place] Do not drive yourself to the hospital. This information is not intended to replace advice given to you by your health care provider. Make sure you discuss any questions you have with your health care provider. Document Released: 01/15/2005 Document Revised: 06/29/2015 Document Reviewed: 02/18/2015 Elsevier Interactive Patient Education  2017 [employer].

## 2016-04-13 ENCOUNTER — Ambulatory Visit: Payer: Medicare Other | Admitting: Rehabilitation

## 2016-04-13 ENCOUNTER — Encounter: Payer: Self-pay | Admitting: Rehabilitation

## 2016-04-13 VITALS — BP 135/87

## 2016-04-13 DIAGNOSIS — R2681 Unsteadiness on feet: Secondary | ICD-10-CM

## 2016-04-13 DIAGNOSIS — M6281 Muscle weakness (generalized): Secondary | ICD-10-CM

## 2016-04-13 DIAGNOSIS — R2689 Other abnormalities of gait and mobility: Secondary | ICD-10-CM | POA: Diagnosis not present

## 2016-04-13 NOTE — Therapy (Signed)
[place] [address] [place], [place], [address] Phone: [phone]   Fax:  [phone]  Physical Therapy Treatment  Patient Details  Name: [name] MRN: [id number] Date of Birth: 06/11/1940 Referring Provider: Flossie Buffy, NP  Encounter Date: 04/13/2016      PT End of Session - 04/13/16 0935    Visit Number 3   Number of Visits 13   Date for PT Re-Evaluation 05/21/16   Authorization Type MCR-G code every 10th visit   PT Start Time 0846   PT Stop Time 0930   PT Time Calculation (min) 44 min   Activity Tolerance Patient tolerated treatment well;Patient limited by fatigue   Behavior During Therapy [facility] for tasks assessed/performed      Past Medical History:  Diagnosis Date  . Abdominal distension   . Anemia   . Anxiety   . B12 deficiency   . Balance problem   . Blood in stool   . Cancer (HCC)    OVARIAN  . Colon polyps   . Dementia    EARLY  . Dementia    EARLY  . Depression   . GERD (gastroesophageal reflux disease)   . History of blood transfusion   . Hypertension   . Hypomagnesemia   . Urine incontinence     Past Surgical History:  Procedure Laterality Date  . ABDOMINAL HYSTERECTOMY    . CATARACT EXTRACTION W/PHACO Right 07/26/2015   Procedure: CATARACT EXTRACTION PHACO AND INTRAOCULAR LENS PLACEMENT (IOC);  Surgeon: Birder Robson, MD;  Location: [facility] ORS;  Service: Ophthalmology;  Laterality: Right;  [name]   1:03.5 AP%  23% CDE   14.59 fluid pack lot # [id number]  . CATARACT EXTRACTION W/PHACO Left 08/29/2015   Procedure: CATARACT EXTRACTION PHACO AND INTRAOCULAR LENS PLACEMENT ([place]);  Surgeon: Birder Robson, MD;  Location: [facility] ORS;  Service: Ophthalmology;  Laterality: Left;  [name] 00:40 AP% 20.9 CDE 8.50 fluid pack lot # [id number]  . COLONOSCOPY WITH PROPOFOL N/A 03/14/2015   Procedure: COLONOSCOPY WITH PROPOFOL;  Surgeon: Hulen Luster, MD;  Location: [facility] ENDOSCOPY;   Service: Gastroenterology;  Laterality: N/A;  . ESOPHAGOGASTRODUODENOSCOPY (EGD) WITH PROPOFOL N/A 03/14/2015   Procedure: ESOPHAGOGASTRODUODENOSCOPY (EGD) WITH PROPOFOL;  Surgeon: Hulen Luster, MD;  Location: [facility] ENDOSCOPY;  Service: Gastroenterology;  Laterality: N/A;  . none    . TOTAL ABDOMINAL HYSTERECTOMY W/ BILATERAL SALPINGOOPHORECTOMY  04/19/2015   exploratory laparoscopy, total abdominal hysterectomy, bilateral salping oophorectomy, left pelvic lymph node dissection, omentectomy    Vitals:   04/13/16 0856  BP: 135/87        Subjective Assessment - 04/13/16 0852    Subjective "I'm doing the walking around the track with the cane, but I feel like the cane hinders me."    Limitations House hold activities;Walking   How long can you walk comfortably? up to 5 mins at a time   Patient Stated Goals "To be more steady on my feet."    Currently in Pain? No/denies                         [facility] Adult PT Treatment/Exercise - 04/13/16 0907      Ambulation/Gait   Ambulation/Gait Yes   Ambulation/Gait Assistance 5: Supervision;6: Modified independent (Device/Increase time)   Ambulation/Gait Assistance Details Briefly assessed use of cane as she states she over uses cane and it tends to "hinder" her walking.  Educated that cane is very light balance point  and should not be used to bear weight through.  Pt demonstrates improvement in session.    Ambulation Distance (Feet) 300 Feet   Assistive device Straight cane   Gait Pattern Step-through pattern;Decreased arm swing - right;Decreased arm swing - left;Decreased stride length;Trunk flexed;Wide base of support   Ambulation Surface Level;Indoor     Neuro Re-ed    Neuro Re-ed Details  Counter top balance exercises; high slow marching x 2 reps forwards/backwards, tandem walking forwards/backwards x 1 rep, side stepping with squats x 2 reps down and back, cone tapping alternating LEs followed by side step over orange barrier x 2  laps down and back.  cues for technique and posture.  Corner balance on pillows with EC feet apart x 2 sets of 30 secs, feet apart with EO head turns side to side x 10 reps, up/down x 10 reps, feet together EC x 30 secs with 2-3 LOB, therefore did not recommend this for home.  Ended with standing on blue mat on ramp with feet staggered with 30 sec hold x 2 reps, EC with R foot behind, however was unable to perform with EC with LLE behind.  Marching on ramp on mat x 10 reps with min A throughout.                  PT Education - 04/13/16 0935    Education provided Yes   Education Details additions to HEP   Person(s) Educated Patient   Methods Explanation;Demonstration;Handout   Comprehension Verbalized understanding;Returned demonstration          PT Short Term Goals - 04/13/16 0936      PT SHORT TERM GOAL #1   Title Pt will initiate HEP in order to indicate improved functional mobility and balance.  (Target Date: 04/20/16)   Time 2   Period Weeks   Status New     PT SHORT TERM GOAL #2   Title Will assess 6MWT and improve distance by 5' in order to indicate improved functional endurance.     Baseline 1000' with single rest break is baseline   Time 2   Period Weeks   Status New     PT SHORT TERM GOAL #3   Title Pt will report return to walking on track across street with S from family/friends in order to indicate return to community fitness.    Time 2   Period Weeks   Status New           PT Long Term Goals - 04/06/16 1052      PT LONG TERM GOAL #1   Title Pt will be independent with final HEP in order to indicate improved functional mobility and balance.  (Target Date: 05/18/16)   Time 6   Period Weeks   Status New     PT LONG TERM GOAL #2   Title Pt will improve DGI to >19/24 in order to indicate decreased fall risk.     Time 6   Period Weeks   Status New     PT LONG TERM GOAL #3   Title Pt will improve gait speed to 2.47 ft/sec w/ LRAD in order to  indicate decreased fall risk and improved efficiency of gait.     Time 6   Period Weeks   Status New     PT LONG TERM GOAL #4   Title Pt will improve 6MWT by 150' from baseline in order to indicate improved functional endurance.     Time  6   Period Weeks   Status New     PT LONG TERM GOAL #5   Title Pt will report beginning to ambulate for short shopping trips with use of cane and/or shopping cart in order to maintain endurance gained in therapy.     Time 6   Period Weeks   Status New               Plan - 04/13/16 0935    Clinical Impression Statement Skilled session continues to focus on high level balance with counter top and corner balance tasks.  Pt with increased difficulty when vestibular system challenged.  Added to HEP, see pt instruction.  Also went over use of cane with gait and note improvement during session as she was overusing cane.    Rehab Potential Good   PT Frequency 2x / week   PT Duration 6 weeks   PT Treatment/Interventions ADLs/Self Care Home Management;DME Instruction;Gait training;Stair training;Functional mobility training;Therapeutic activities;Therapeutic exercise;Balance training;Neuromuscular re-education;Patient/family education;Energy conservation;Vestibular   PT Next Visit Plan progress HEP for functional strength/BLE, esp hip, high level balance, endurance, gait with and without cane to challenge balance.    PT Home Exercise Plan verbally initiated walking program during eval   Consulted and Agree with Plan of Care Patient      Patient will benefit from skilled therapeutic intervention in order to improve the following deficits and impairments:  Decreased activity tolerance, Decreased balance, Decreased endurance, Decreased knowledge of precautions, Decreased knowledge of use of DME, Decreased strength, Impaired perceived functional ability, Impaired flexibility, Improper body mechanics, Postural dysfunction, Obesity  Visit  Diagnosis: Unsteadiness on feet  Muscle weakness (generalized)  Other abnormalities of gait and mobility     Problem List Patient Active Problem List   Diagnosis Date Noted  . Type 2 diabetes mellitus with complication, with long-term current use of insulin ([place]) 04/12/2016  . Vitamin D deficiency 03/06/2016  . 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 Essential hypertension 03/21/2015  . Vision decreased 03/21/2015  . Tension-type headache, not intractable 03/21/2015  . Anemia due to chronic blood loss 03/16/2015  . Morbid obesity ([place]) 02/02/2015  . Anemia 01/30/2015   Cameron Sprang, PT, MPT [facility] [address] [place], [place], [address] Phone: [phone]   Fax:  [phone] 04/13/16, 11:17 AM  Name: [name] MRN: [id number] Date of Birth: 05-25-1940

## 2016-04-13 NOTE — Patient Instructions (Addendum)
Tandem Stance    Right foot in front of left, heel touching toe both feet "straight ahead". Stand on Foot Triangle of Support with both feet. Balance in this position __30_ seconds. Do with left foot in front of right. Try to perform x2  "I love a Parade" Lift    Do this along a counter top. March in while walking forward as slow but as high as you can.  Then repeat backwards.  Do this 3 laps down and back.   Do _1-2___ sessions per day.  Tandem Walking    Walk with each foot directly in front of other, heel of one foot touching toes of other foot with each step. Both feet straight ahead. Do this along counter top for support doing it forwards and then backwards.  Repeat 3 laps.     Copyright  VHI. All rights reserved.    http://gt2.exer.us/345   Copyright  VHI. All rights reserved.    Feet Apart (Compliant Surface) Arm Motion - Eyes Closed    Stand in a corner with a chair in front of you.  Stand on compliant surface: ___pillow or cushion_____, feet shoulder width apart. Close eyes.  Repeat _3___ times per session of 30 secs. Do _1-2___ sessions per day.  Copyright  VHI. All rights reserved.

## 2016-04-17 ENCOUNTER — Ambulatory Visit: Payer: Medicare Other | Admitting: Podiatry

## 2016-04-17 ENCOUNTER — Encounter: Payer: Self-pay | Admitting: Physical Therapy

## 2016-04-17 ENCOUNTER — Ambulatory Visit: Payer: Medicare Other | Admitting: Physical Therapy

## 2016-04-17 DIAGNOSIS — M6281 Muscle weakness (generalized): Secondary | ICD-10-CM

## 2016-04-17 DIAGNOSIS — R2689 Other abnormalities of gait and mobility: Secondary | ICD-10-CM

## 2016-04-17 DIAGNOSIS — R2681 Unsteadiness on feet: Secondary | ICD-10-CM

## 2016-04-17 NOTE — Patient Instructions (Signed)
  Standing Hip Abduction    While standing, raise your leg out to the side. Keep your knee straight and maintain your toes pointed forward the entire time.  Alternate lifting right then left leg. Repeat 10 times each leg for 1-2 sets.  1 sessions per day.  Use your arms for support if needed for balance and safety.    Standing Hip Extension    While standing, balance on one leg and move your other leg in a backward direction. Do not swing the leg. Perform smooth and controlled movements. Alternate right leg and left leg.   Keep your trunk stable and without arching during the movement.  Repeat 10 times for 1-2 sets.  Perform 1 sessions per day.  Use your arms for support if needed for balance and safety.

## 2016-04-17 NOTE — Therapy (Signed)
[facility] [address] [place], [place], [address] Phone: [phone]   Fax:  [phone]  Physical Therapy Treatment  Patient Details  Name: [name] MRN: [id number] Date of Birth: 1940/11/21 Referring Provider: Flossie Buffy, NP  Encounter Date: 04/17/2016      PT End of Session - 04/17/16 2109    Visit Number 4   Number of Visits 13   Date for PT Re-Evaluation 05/21/16   Authorization Type MCR-G code every 10th visit   PT Start Time 1021   PT Stop Time 1100   PT Time Calculation (min) 39 min   Activity Tolerance Patient tolerated treatment well   Behavior During Therapy [facility] for tasks assessed/performed      Past Medical History:  Diagnosis Date  . Abdominal distension   . Anemia   . Anxiety   . B12 deficiency   . Balance problem   . Blood in stool   . Cancer (HCC)    OVARIAN  . Colon polyps   . Dementia    EARLY  . Dementia    EARLY  . Depression   . GERD (gastroesophageal reflux disease)   . History of blood transfusion   . Hypertension   . Hypomagnesemia   . Urine incontinence     Past Surgical History:  Procedure Laterality Date  . ABDOMINAL HYSTERECTOMY    . CATARACT EXTRACTION W/PHACO Right 07/26/2015   Procedure: CATARACT EXTRACTION PHACO AND INTRAOCULAR LENS PLACEMENT (IOC);  Surgeon: Birder Robson, MD;  Location: [facility] ORS;  Service: Ophthalmology;  Laterality: Right;  [name]   1:03.5 AP%  23% CDE   14.59 fluid pack lot # [id number]  . CATARACT EXTRACTION W/PHACO Left 08/29/2015   Procedure: CATARACT EXTRACTION PHACO AND INTRAOCULAR LENS PLACEMENT ([place]);  Surgeon: Birder Robson, MD;  Location: [facility] ORS;  Service: Ophthalmology;  Laterality: Left;  [name] 00:40 AP% 20.9 CDE 8.50 fluid pack lot # [id number]  . COLONOSCOPY WITH PROPOFOL N/A 03/14/2015   Procedure: COLONOSCOPY WITH PROPOFOL;  Surgeon: Hulen Luster, MD;  Location: [facility] ENDOSCOPY;  Service: Gastroenterology;   Laterality: N/A;  . ESOPHAGOGASTRODUODENOSCOPY (EGD) WITH PROPOFOL N/A 03/14/2015   Procedure: ESOPHAGOGASTRODUODENOSCOPY (EGD) WITH PROPOFOL;  Surgeon: Hulen Luster, MD;  Location: [facility] ENDOSCOPY;  Service: Gastroenterology;  Laterality: N/A;  . none    . TOTAL ABDOMINAL HYSTERECTOMY W/ BILATERAL SALPINGOOPHORECTOMY  04/19/2015   exploratory laparoscopy, total abdominal hysterectomy, bilateral salping oophorectomy, left pelvic lymph node dissection, omentectomy    There were no vitals filed for this visit.      Subjective Assessment - 04/17/16 1023    Subjective Did not walk on the track this morning due to rain.    Limitations House hold activities;Walking   How long can you walk comfortably? up to 5 mins at a time   Patient Stated Goals "To be more steady on my feet."    Currently in Pain? No/denies                         [facility] Adult PT Treatment/Exercise - 04/17/16 0001      Ambulation/Gait   Ambulation/Gait Yes   Ambulation/Gait Assistance 5: Supervision;6: Modified independent (Device/Increase time)   Ambulation/Gait Assistance Details with vs without cane; able to incr velocity significantly; no LOB or stagger   Ambulation Distance (Feet) 100 Feet  110, 220, 50   Assistive device Straight cane   Gait Pattern Step-through pattern;Decreased arm swing - right;Decreased  arm swing - left;Decreased stride length;Wide base of support   Ambulation Surface Level;Indoor     Posture/Postural Control   Posture/Postural Control Postural limitations   Postural Limitations Rounded Shoulders;Forward head;Decreased lumbar lordosis   Posture Comments verbal and tactile cues for improvement during exercises     Neuro Re-ed    Neuro Re-ed Details  --     Exercises   Exercises Knee/Hip     Knee/Hip Exercises: Standing   Hip Abduction Stengthening;Both;1 set;10 reps;Knee straight  at counter; verbal/tactile cues   Hip Extension Stengthening;Both;1 set;10 reps  at  counter; verbal/tactile cues   SLS with Vectors light single UE support at counter; single tap 2 cones, alternating legs             Balance Exercises - 04/17/16 2104      Balance Exercises: Standing   Standing Eyes Opened Narrow base of support (BOS);Foam/compliant surface;2 reps;30 secs  blue airex; //bars; light to no UE   Standing Eyes Closed Narrow base of support (BOS);Foam/compliant surface;2 reps;30 secs  blue airex; //bars; light to no UE   Step Over Hurdles / Cones side-step over hurdles (bil UE on counter) on red mat; squat with each step; x 6 lengths   Marching Limitations marching fwd/backward length of red mat x 3 (light UE support via counter)           PT Education - 04/17/16 2108    Education provided Yes   Education Details see HEP   Person(s) Educated Patient   Methods Explanation;Demonstration;Tactile cues;Verbal cues;Handout   Comprehension Verbalized understanding;Returned demonstration;Need further instruction          PT Short Term Goals - 04/13/16 0936      PT SHORT TERM GOAL #1   Title Pt will initiate HEP in order to indicate improved functional mobility and balance.  (Target Date: 04/20/16)   Time 2   Period Weeks   Status New     PT SHORT TERM GOAL #2   Title Will assess 6MWT and improve distance by 70' in order to indicate improved functional endurance.     Baseline 1000' with single rest break is baseline   Time 2   Period Weeks   Status New     PT SHORT TERM GOAL #3   Title Pt will report return to walking on track across street with S from family/friends in order to indicate return to community fitness.    Time 2   Period Weeks   Status New           PT Long Term Goals - 04/06/16 1052      PT LONG TERM GOAL #1   Title Pt will be independent with final HEP in order to indicate improved functional mobility and balance.  (Target Date: 05/18/16)   Time 6   Period Weeks   Status New     PT LONG TERM GOAL #2   Title Pt  will improve DGI to >19/24 in order to indicate decreased fall risk.     Time 6   Period Weeks   Status New     PT LONG TERM GOAL #3   Title Pt will improve gait speed to 2.47 ft/sec w/ LRAD in order to indicate decreased fall risk and improved efficiency of gait.     Time 6   Period Weeks   Status New     PT LONG TERM GOAL #4   Title Pt will improve 6MWT by 150' from baseline  in order to indicate improved functional endurance.     Time 6   Period Weeks   Status New     PT LONG TERM GOAL #5   Title Pt will report beginning to ambulate for short shopping trips with use of cane and/or shopping cart in order to maintain endurance gained in therapy.     Time 6   Period Weeks   Status New               Plan - 04/17/16 2110    Clinical Impression Statement progressing towards goals. Focus on strengthening, balance, and gait.    Rehab Potential Good   PT Frequency 2x / week   PT Duration 6 weeks   PT Treatment/Interventions ADLs/Self Care Home Management;DME Instruction;Gait training;Stair training;Functional mobility training;Therapeutic activities;Therapeutic exercise;Balance training;Neuromuscular re-education;Patient/family education;Energy conservation;Vestibular   PT Next Visit Plan review additions to HEP 3/20; hip strengthening, high level balance, gait with and without cane to challenge balance.    PT Home Exercise Plan verbally initiated walking program during eval   Consulted and Agree with Plan of Care Patient      Patient will benefit from skilled therapeutic intervention in order to improve the following deficits and impairments:  Decreased activity tolerance, Decreased balance, Decreased endurance, Decreased knowledge of precautions, Decreased knowledge of use of DME, Decreased strength, Impaired perceived functional ability, Impaired flexibility, Improper body mechanics, Postural dysfunction, Obesity  Visit Diagnosis: Unsteadiness on feet  Muscle weakness  (generalized)  Other abnormalities of gait and mobility     Problem List Patient Active Problem List   Diagnosis Date Noted  . Type 2 diabetes mellitus with complication, with long-term current use of insulin ([place]) 04/12/2016  . Vitamin D deficiency 03/06/2016  . 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 Essential hypertension 03/21/2015  . Vision decreased 03/21/2015  . Tension-type headache, not intractable 03/21/2015  . Anemia due to chronic blood loss 03/16/2015  . Morbid obesity ([place]) 02/02/2015  . Anemia 01/30/2015    Rexanne Mano, PT 04/17/2016, 9:15 PM  [place] [address] [place], [place], [address] Phone: [phone]   Fax:  [phone]  Name: [name] MRN: [id number] Date of Birth: 1940/12/24

## 2016-04-19 ENCOUNTER — Telehealth: Payer: Self-pay | Admitting: Nurse Practitioner

## 2016-04-19 ENCOUNTER — Ambulatory Visit: Payer: Medicare Other | Admitting: Rehabilitation

## 2016-04-19 ENCOUNTER — Encounter: Payer: Self-pay | Admitting: Nurse Practitioner

## 2016-04-19 ENCOUNTER — Ambulatory Visit (INDEPENDENT_AMBULATORY_CARE_PROVIDER_SITE_OTHER): Payer: Medicare Other | Admitting: Nurse Practitioner

## 2016-04-19 VITALS — BP 140/86 | HR 90 | Temp 98.2°F | Ht 64.5 in | Wt 224.0 lb

## 2016-04-19 DIAGNOSIS — E782 Mixed hyperlipidemia: Secondary | ICD-10-CM | POA: Diagnosis not present

## 2016-04-19 DIAGNOSIS — Z598 Other problems related to housing and economic circumstances: Secondary | ICD-10-CM

## 2016-04-19 DIAGNOSIS — Z599 Problem related to housing and economic circumstances, unspecified: Secondary | ICD-10-CM

## 2016-04-19 DIAGNOSIS — E118 Type 2 diabetes mellitus with unspecified complications: Secondary | ICD-10-CM

## 2016-04-19 DIAGNOSIS — I10 Essential (primary) hypertension: Secondary | ICD-10-CM

## 2016-04-19 DIAGNOSIS — Z794 Long term (current) use of insulin: Secondary | ICD-10-CM

## 2016-04-19 NOTE — Progress Notes (Signed)
Subjective:  Patient ID: [name], female    DOB: 23-Aug-1940  Age: 76 y.o. MRN: [id number]  CC: Follow-up (1 wk fu/med consult)  HPI  [name] presents to office for DM follow up. She states she has not been able to take Lantus or check glucose as instructed due to current financial crisis and upcoming eviction. She reports high stress level due to upcoming home eviction 04/23/16, hence has not been able to focus on her health. Reports she loss printed information on [employer] financial assistance, hence did not call. She is declining any physical exam today or any further change in medication at this time.  Outpatient Medications Prior to Visit  Medication Sig Dispense Refill  . blood glucose meter kit and supplies KIT Dispense based on patient and insurance preference. Use up to three times daily before meals. (FOR ICD-9 250.00, 250.01). Patient given OneTouch Verio glucometer in office. 1 each 0  . cholecalciferol (VITAMIN D) 400 units TABS tablet Take 1,000 Units by mouth daily.    . citalopram (CELEXA) 10 MG tablet Take 10 mg by mouth daily.    [place] Kitchen glucose blood test strip Onetouch verio glucometer:Check glucose twice a day (before breakfast and at bedtime). 100 each 3  . ibuprofen (ADVIL,MOTRIN) 600 MG tablet Take 600 mg by mouth every 6 (six) hours as needed.    . insulin glargine (LANTUS) 100 unit/mL SOPN Inject 0.1 mLs (10 Units total) into the skin at bedtime. 15 mL 0  . Insulin Pen Needle (PEN NEEDLES) 31G X 8 MM MISC 1 application by Does not apply route at bedtime. 100 each 3  . ONETOUCH DELICA LANCETS 63Z MISC 1 application by Does not apply route 2 (two) times daily before a meal. 100 each 3  . calcium carbonate (TUMS - DOSED IN MG ELEMENTAL CALCIUM) 500 MG chewable tablet Chew 1 tablet by mouth every 6 (six) hours as needed for indigestion or heartburn.    . fenofibrate (TRICOR) 145 MG tablet Take 1 tablet (145 mg total) by mouth daily. (Patient not taking: Reported  on 04/19/2016) 30 tablet 3  . lisinopril (PRINIVIL,ZESTRIL) 5 MG tablet Take 1 tablet (5 mg total) by mouth daily. (Patient not taking: Reported on 04/19/2016) 90 tablet 1  . ondansetron (ZOFRAN) 4 MG tablet Take 4 mg by mouth every 8 (eight) hours as needed for nausea or vomiting.    . SitaGLIPtin-MetFORMIN HCl 50-1000 MG TB24 Take 1 tablet by mouth daily after lunch. (Patient not taking: Reported on 04/19/2016) 60 tablet 0   No facility-administered medications prior to visit.     ROS See HPI  Objective:  BP 140/86   Pulse 90   Temp 98.2 F (36.8 C)   Ht 5' 4.5" (1.638 m)   Wt 224 lb (101.6 kg)   SpO2 98%   BMI 37.86 kg/m   BP Readings from Last 3 Encounters:  04/19/16 140/86  04/13/16 135/87  04/12/16 132/80    Wt Readings from Last 3 Encounters:  04/19/16 224 lb (101.6 kg)  04/12/16 197 lb (89.4 kg)  03/06/16 219 lb (99.3 kg)    Physical Exam  Constitutional: She is oriented to person, place, and time. She appears distressed.  Cardiovascular: Normal rate.   Pulmonary/Chest: Effort normal. No respiratory distress.  Neurological: She is alert and oriented to person, place, and time.  She decline additional testing and medications at this time, due to emotional distress over upcoming eviction.  Lab Results  Component Value Date  WBC 8.2 03/06/2016   HGB 15.5 (H) 03/06/2016   HCT 45.0 03/06/2016   PLT 340.0 03/06/2016   GLUCOSE 476 (H) 03/06/2016   CHOL 235 (H) 03/06/2016   TRIG 269.0 (H) 03/06/2016   HDL 54.40 03/06/2016   LDLDIRECT 139.0 03/06/2016   ALT 13 (L) 01/30/2015   AST 16 01/30/2015   NA 131 (L) 03/06/2016   K 4.2 03/06/2016   CL 94 (L) 03/06/2016   CREATININE 0.96 03/06/2016   BUN 19 03/06/2016   CO2 29 03/06/2016   INR 1.1 07/26/2013   HGBA1C 12.5 (H) 03/09/2016    No results found.  Assessment & Plan:   Raschelle was seen today for follow-up.  Diagnoses and all orders for this visit:  Type 2 diabetes mellitus with complication, with  long-term current use of insulin ([place]) -     AMB Referral to [place] Management  Essential hypertension  Mixed hyperlipidemia  Financial difficulty -     AMB Referral to [place] Management   I am having Ms. Blauvelt maintain her calcium carbonate, ondansetron, cholecalciferol, citalopram, ibuprofen, SitaGLIPtin-MetFORMIN HCl, Pen Needles, ONETOUCH DELICA LANCETS 88T, glucose blood, insulin glargine, blood glucose meter kit and supplies, lisinopril, and fenofibrate.  No orders of the defined types were placed in this encounter.   Follow-up: Return if symptoms worsen or fail to improve.  Wilfred Lacy, NP

## 2016-04-19 NOTE — Telephone Encounter (Signed)
Paper work for [employer] and handicap placard is done, place up front for pt to fill her part out and mail it in (copy send to scan)  Pt aware.

## 2016-04-19 NOTE — Progress Notes (Signed)
Pre visit review using our clinic review tool, if applicable. No additional management support is needed unless otherwise documented below in the visit note. 

## 2016-04-20 ENCOUNTER — Telehealth: Payer: Self-pay

## 2016-04-20 NOTE — Telephone Encounter (Signed)
Advised patient she can call [employer] located at [employer]. [place] or can call them at [phone] should be able to provide lots of resources and or be able to provide food,shelter,etc----call us back if she needs additional help

## 2016-04-23 ENCOUNTER — Encounter: Payer: Self-pay | Admitting: Physical Therapy

## 2016-04-23 ENCOUNTER — Ambulatory Visit: Payer: Medicare Other | Admitting: Physical Therapy

## 2016-04-23 DIAGNOSIS — M6281 Muscle weakness (generalized): Secondary | ICD-10-CM

## 2016-04-23 DIAGNOSIS — R2689 Other abnormalities of gait and mobility: Secondary | ICD-10-CM

## 2016-04-23 DIAGNOSIS — R2681 Unsteadiness on feet: Secondary | ICD-10-CM | POA: Diagnosis not present

## 2016-04-23 NOTE — Therapy (Signed)
[facility] [address] [place], [place], [address] Phone: [phone]   Fax:  [phone]  Physical Therapy Treatment  Patient Details  Name: [name] MRN: [id number] Date of Birth: Sep 03, 1940 Referring Provider: Flossie Buffy, NP  Encounter Date: 04/23/2016      PT End of Session - 04/23/16 2035    Visit Number 5   Number of Visits 13   Date for PT Re-Evaluation 05/21/16   Authorization Type MCR-G code every 10th visit   PT Start Time 0855   PT Stop Time 0934   PT Time Calculation (min) 39 min   Equipment Utilized During Treatment Gait belt   Activity Tolerance Patient tolerated treatment well   Behavior During Therapy [facility] for tasks assessed/performed      Past Medical History:  Diagnosis Date  . Abdominal distension   . Anemia   . Anxiety   . B12 deficiency   . Balance problem   . Blood in stool   . Cancer (HCC)    OVARIAN  . Colon polyps   . Dementia    EARLY  . Dementia    EARLY  . Depression   . GERD (gastroesophageal reflux disease)   . History of blood transfusion   . Hypertension   . Hypomagnesemia   . Urine incontinence     Past Surgical History:  Procedure Laterality Date  . ABDOMINAL HYSTERECTOMY    . CATARACT EXTRACTION W/PHACO Right 07/26/2015   Procedure: CATARACT EXTRACTION PHACO AND INTRAOCULAR LENS PLACEMENT (IOC);  Surgeon: Birder Robson, MD;  Location: [facility] ORS;  Service: Ophthalmology;  Laterality: Right;  [name]   1:03.5 AP%  23% CDE   14.59 fluid pack lot # [id number]  . CATARACT EXTRACTION W/PHACO Left 08/29/2015   Procedure: CATARACT EXTRACTION PHACO AND INTRAOCULAR LENS PLACEMENT ([place]);  Surgeon: Birder Robson, MD;  Location: [facility] ORS;  Service: Ophthalmology;  Laterality: Left;  [name] 00:40 AP% 20.9 CDE 8.50 fluid pack lot # [id number]  . COLONOSCOPY WITH PROPOFOL N/A 03/14/2015   Procedure: COLONOSCOPY WITH PROPOFOL;  Surgeon: Hulen Luster, MD;  Location:  [facility] ENDOSCOPY;  Service: Gastroenterology;  Laterality: N/A;  . ESOPHAGOGASTRODUODENOSCOPY (EGD) WITH PROPOFOL N/A 03/14/2015   Procedure: ESOPHAGOGASTRODUODENOSCOPY (EGD) WITH PROPOFOL;  Surgeon: Hulen Luster, MD;  Location: [facility] ENDOSCOPY;  Service: Gastroenterology;  Laterality: N/A;  . none    . TOTAL ABDOMINAL HYSTERECTOMY W/ BILATERAL SALPINGOOPHORECTOMY  04/19/2015   exploratory laparoscopy, total abdominal hysterectomy, bilateral salping oophorectomy, left pelvic lymph node dissection, omentectomy    There were no vitals filed for this visit.      Subjective Assessment - 04/23/16 2021    Subjective Reports she has done some walking on the track. Has someone go with her and walks for up to 2 laps.    Limitations House hold activities;Walking   How long can you walk comfortably? up to 5 mins at a time   Patient Stated Goals "To be more steady on my feet."    Currently in Pain? No/denies            [facility] PT Assessment - 04/23/16 0910      6 Minute Walk- Baseline   6 Minute Walk- Baseline yes   BP (mmHg) 155/87   HR (bpm) 101   Modified Borg Scale for Dyspnea 0- Nothing at all   Perceived Rate of Exertion (Borg) 7- Very, very light     6 Minute walk- Post Test   6 Minute  Walk Post Test yes   BP (mmHg) (!)  172/100   HR (bpm) 104   Modified Borg Scale for Dyspnea 3- Moderate shortness of breath or breathing difficulty   Perceived Rate of Exertion (Borg) 13- Somewhat hard     6 minute walk test results    Aerobic Endurance Distance Walked 1000   Endurance additional comments after 4 minute cool-down BP sitting 158/94; pt carried [facility] most of the time; if used, during turns                     [facility] Adult PT Treatment/Exercise - 04/23/16 0001      Knee/Hip Exercises: Standing   Hip Abduction Stengthening;Both;1 set;10 reps;Knee straight             Balance Exercises - 04/23/16 2029      Balance Exercises: Standing   Standing Eyes Opened Narrow  base of support (BOS);Wide (BOA);Foam/compliant surface;5 reps;30 secs  30 sec max; large amt of sway; 2 pieces foam   Standing Eyes Closed Wide (BOA);Foam/compliant surface;1 rep;30 secs  red mat   Retro Gait Foam/compliant surface;2 reps  length red mat   Turning Both;10 reps  level indoor vs red mat/compliant   Marching Limitations marching forward length of red mat x 3 (light UE support via counter)   Heel Raises Limitations 10 reps x 2 sets; light UE support   Toe Raise Limitations 10 reps x 2 sets; light UE support           PT Education - 04/23/16 2037    Education provided Yes   Education Details appropriate BP levels/goals   Person(s) Educated Patient   Methods Explanation   Comprehension Verbalized understanding          PT Short Term Goals - 04/23/16 2038      PT SHORT TERM GOAL #1   Title Pt will initiate HEP in order to indicate improved functional mobility and balance.  (Target Date: 04/20/16)   Time 2   Period Weeks   Status Achieved     PT SHORT TERM GOAL #2   Title Will assess 6MWT and improve distance by 83' in order to indicate improved functional endurance.     Baseline 1000' with single rest break is baseline; 3/26 1038f, no rest period   Time 2   Period Weeks   Status On-going     PT SHORT TERM GOAL #3   Title Pt will report return to walking on track across street with S from family/friends in order to indicate return to community fitness.    Time 2   Period Weeks   Status Achieved           PT Long Term Goals - 04/06/16 1052      PT LONG TERM GOAL #1   Title Pt will be independent with final HEP in order to indicate improved functional mobility and balance.  (Target Date: 05/18/16)   Time 6   Period Weeks   Status New     PT LONG TERM GOAL #2   Title Pt will improve DGI to >19/24 in order to indicate decreased fall risk.     Time 6   Period Weeks   Status New     PT LONG TERM GOAL #3   Title Pt will improve gait speed to 2.47  ft/sec w/ LRAD in order to indicate decreased fall risk and improved efficiency of gait.     Time 6  Period Weeks   Status New     PT LONG TERM GOAL #4   Title Pt will improve 6MWT by 150' from baseline in order to indicate improved functional endurance.     Time 6   Period Weeks   Status New     PT LONG TERM GOAL #5   Title Pt will report beginning to ambulate for short shopping trips with use of cane and/or shopping cart in order to maintain endurance gained in therapy.     Time 6   Period Weeks   Status New               Plan - 04/23/16 2036    Clinical Impression Statement Session focused on higher level balance activities, gait/endurance assessment (6 minute walk test), and strengthening exercises. Pt continues to make progress having met 2 of 3 STGs with 3rd goal ongoing.   Rehab Potential Good   PT Frequency 2x / week   PT Duration 6 weeks   PT Treatment/Interventions ADLs/Self Care Home Management;DME Instruction;Gait training;Stair training;Functional mobility training;Therapeutic activities;Therapeutic exercise;Balance training;Neuromuscular re-education;Patient/family education;Energy conservation;Vestibular   PT Next Visit Plan review standing hip ext and abdct (from HEP); hip strengthening, high level balance, gait with and without cane to challenge balance.    PT Home Exercise Plan verbally initiated walking program during eval   Consulted and Agree with Plan of Care Patient      Patient will benefit from skilled therapeutic intervention in order to improve the following deficits and impairments:  Decreased activity tolerance, Decreased balance, Decreased endurance, Decreased knowledge of precautions, Decreased knowledge of use of DME, Decreased strength, Impaired perceived functional ability, Impaired flexibility, Improper body mechanics, Postural dysfunction, Obesity  Visit Diagnosis: Unsteadiness on feet  Muscle weakness (generalized)  Other  abnormalities of gait and mobility     Problem List Patient Active Problem List   Diagnosis Date Noted  . Type 2 diabetes mellitus with complication, with long-term current use of insulin ([place]) 04/12/2016  . Vitamin D deficiency 03/06/2016  . 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 Essential hypertension 03/21/2015  . Vision decreased 03/21/2015  . Tension-type headache, not intractable 03/21/2015  . Anemia due to chronic blood loss 03/16/2015  . Morbid obesity ([place]) 02/02/2015  . Anemia 01/30/2015    Rexanne Mano, PT 04/23/2016, 8:50 PM  [facility] [address] [place], [place], [address] Phone: [phone]   Fax:  [phone]  Name: [name] MRN: [id number] Date of Birth: August 24, 1940

## 2016-04-25 ENCOUNTER — Ambulatory Visit: Payer: Medicare Other | Admitting: Physical Therapy

## 2016-04-30 ENCOUNTER — Ambulatory Visit: Payer: Medicare Other | Attending: Nurse Practitioner | Admitting: Rehabilitation

## 2016-05-03 ENCOUNTER — Ambulatory Visit: Payer: Medicare Other | Admitting: Physical Therapy

## 2016-05-07 ENCOUNTER — Ambulatory Visit: Payer: Medicare Other | Admitting: Rehabilitation

## 2016-05-08 ENCOUNTER — Ambulatory Visit: Payer: Medicare Other | Admitting: Nurse Practitioner

## 2016-05-10 ENCOUNTER — Ambulatory Visit: Payer: Medicare Other | Admitting: Registered"

## 2016-05-10 ENCOUNTER — Ambulatory Visit: Payer: Medicare Other | Admitting: Physical Therapy

## 2016-05-14 ENCOUNTER — Ambulatory Visit: Payer: Medicare Other | Admitting: Rehabilitation

## 2016-05-17 ENCOUNTER — Ambulatory Visit: Payer: Medicare Other | Admitting: Rehabilitation

## 2016-05-23 ENCOUNTER — Ambulatory Visit: Payer: Medicare Other

## 2016-05-23 ENCOUNTER — Inpatient Hospital Stay: Payer: Medicare Other

## 2016-05-28 ENCOUNTER — Ambulatory Visit: Payer: Medicare Other

## 2016-05-29 ENCOUNTER — Inpatient Hospital Stay: Payer: Medicare Other | Admitting: Oncology

## 2016-05-30 ENCOUNTER — Inpatient Hospital Stay: Payer: Medicare Other

## 2016-05-30 NOTE — Progress Notes (Unsigned)
Gynecologic Oncology Consult Visit   Referring Provider: Dr. Ouida Sills  Chief Concern: Continued surveillance for sex cord-stromal tumor, NOS   Subjective:  [name] is a 76 y.o. female  P4 who was seen in consultation from Dr. Ouida Sills for for pelvic mass with postmenopausal bleeding/anemia and elevated CA125 who was diagnosed with a sex cord-stromal tumor, NOS.Please see her history as noted below.   On 04/19/15, patient underwent and uncomplicated exploratory laparoscopy, total abdominal hysterectomy, bilateral salping oophorectomy, left pelvic lymph node dissection, omentectomy with Dr. Fransisca Connors.   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 Pathology: DIAGNOSIS  A. 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 See comment.  Fallopian tube: Free of tumor involvement.   See Synoptic Report.   Comment: The morphology and immunohistochemical profile (diffusely positive for inhibin and calretinin) are consistent with a sex-cord stromal tumor. While the histology of certain areas are suggestive of a granulosa cell tumor, overall the nuclei are more rounded with more nucleoli and fewer nuclear grooves than would be expected in a granulosa cell tumor. Focal areas of increased mitotic activity (up to 6/hpf) are also observed. Similarly, no identifiable tubule formations of Sertoli cells or discrete populations of Leydig cells are seen. For these reasons, the tumor is best classified as a sex cord-stromal tumor, not otherwise specified. This specimen has been reviewed with Drs. Rex Telford Nab and Delmer Islam, who agree with the above interpretation.   B. Uterus, cervix, right ovary and fallopian tube, hysterectomy and salpingo-oophorectomy:  Uterus (419 grams) Endometrium: Benign polyp, 4.6 cm. Markedly decidualized endometrium, consistent with exogenous hormone therapy. Myometrium:  Adenomyosis. Leiomyomata. Cervix: No pathologic diagnosis. Serosa: No pathologic diagnosis. Free of tumor.   Right ovary: No pathologic diagnosis. Free of tumor.  Right fallopian tube: No pathologic diagnosis. Free of tumor.    C. Left external iliac lymph node, excision:  Four lymph nodes, negative for malignancy (0/4).   D. Omentum, omentectomy:  Fibroadipose tissue, negative for malignancy.      Gynecologic Oncology  She has a long history in that she was admitted to [facility] in 7/15 with PMB, iron deficiency anemia and an 8 cm left pelvic mass. CA125 was 10. PAP 08/21/14 AGUS. Dr Ouida Sills did an endometrial bx 08/25/14 that showed polyp with simple hyperplasia and disordered proliferative endometrium. She refused surgery offered by Dr Ouida Sills and Dr Jacquelyne Balint and was lost to follow up.   She was admitted again 01/30/15 after presenting to the emergency department because of nausea and vomiting and possibly melena. Hemoglobin was 5.3 with MCV 46.9, Hct 20.7, Plts 454k, WBC 9,600. Hgb 7.8 after 2 units of blood. CT scan 1/17 showed that the heterogeneous pelvic mass has grown to 17 cm. CT also shows some ascites, but no carcinomatosis. CA125 now 112 and CEA 1.6 normal. Dr Ouida Sills examined the patient in his office 1/3 and endometrial biopsy and PAP were negative.02/05/15 she received an additional blood transfusion.    States she had an IUD, but not sure if it was removed.   She has been in a SNF since discharge due to immobility.  She saw Dr. Fransisca Connors on 02/23/2015. Her exam was limited due to habitus. He recommended colonoscopy/endoscopy to evaluate the source of bleeding. He also recommended that she see Dr. Nicki Reaper for management of hypertension. She did not follow up with her appointment with Dr. Einar Pheasant.   CT a/p on 02/15/2015 The left adnexal mass has markedly enlarged,  now measuring 16.9 cm and previously measuring 8 cm. It is heterogeneous  with solid common calcified, and and fluid elements.  CA125 on 1/3/17was 112 and her CEA was 1.6.  She underwent colonoscopy and EGD on 02/14/2015 - no acute abnormalities noted.  She was recommended to undergo surgical evaluation.      Problem List: Patient Active Problem List   Diagnosis Date Noted  . Type 2 diabetes mellitus with complication, with long-term current use of insulin ([place]) 04/12/2016  . Vitamin D deficiency 03/06/2016  . 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 Essential hypertension 03/21/2015  . Vision decreased 03/21/2015  . Tension-type headache, not intractable 03/21/2015  . Anemia due to chronic blood loss 03/16/2015  . Morbid obesity ([place]) 02/02/2015  . Anemia 01/30/2015    Past Medical History: Past Medical History:  Diagnosis Date  . Abdominal distension   . Anemia   . Anxiety   . B12 deficiency   . Balance problem   . Blood in stool   . Cancer (HCC)    OVARIAN  . Colon polyps   . Dementia    EARLY  . Dementia    EARLY  . Depression   . GERD (gastroesophageal reflux disease)   . History of blood transfusion   . Hypertension   . Hypomagnesemia   . Urine incontinence     Past Surgical History: Past Surgical History:  Procedure Laterality Date  . ABDOMINAL HYSTERECTOMY    . CATARACT EXTRACTION W/PHACO Right 07/26/2015   Procedure: CATARACT EXTRACTION PHACO AND INTRAOCULAR LENS PLACEMENT (IOC);  Surgeon: Birder Robson, MD;  Location: [facility] ORS;  Service: Ophthalmology;  Laterality: Right;  [name]   1:03.5 AP%  23% CDE   14.59 fluid pack lot # [id number]  . CATARACT EXTRACTION W/PHACO Left 08/29/2015   Procedure: CATARACT EXTRACTION PHACO AND INTRAOCULAR LENS PLACEMENT ([place]);  Surgeon: Birder Robson, MD;  Location: [facility] ORS;  Service: Ophthalmology;  Laterality: Left;  [name] 00:40 AP% 20.9 CDE 8.50 fluid pack lot # [id number]  . COLONOSCOPY WITH PROPOFOL N/A 03/14/2015   Procedure: COLONOSCOPY WITH PROPOFOL;  Surgeon: Hulen Luster, MD;  Location: [facility]  ENDOSCOPY;  Service: Gastroenterology;  Laterality: N/A;  . ESOPHAGOGASTRODUODENOSCOPY (EGD) WITH PROPOFOL N/A 03/14/2015   Procedure: ESOPHAGOGASTRODUODENOSCOPY (EGD) WITH PROPOFOL;  Surgeon: Hulen Luster, MD;  Location: [facility] ENDOSCOPY;  Service: Gastroenterology;  Laterality: N/A;  . none    . TOTAL ABDOMINAL HYSTERECTOMY W/ BILATERAL SALPINGOOPHORECTOMY  04/19/2015   exploratory laparoscopy, total abdominal hysterectomy, bilateral salping oophorectomy, left pelvic lymph node dissection, omentectomy   Family History: Family History  Problem Relation Age of Onset  . Family history unknown: Yes    Social History: Social History   Social History  . Marital status: Legally Separated    Spouse name: N/A  . Number of children: N/A  . Years of education: N/A   Occupational History  . Not on file.   Social History Main Topics  . Smoking status: Never Smoker  . Smokeless tobacco: Never Used  . Alcohol use No  . Drug use: No  . Sexual activity: Not on file   Other Topics Concern  . Not on file   Social History Narrative  . No narrative on file    Allergies: No Known Allergies  Current Medications: Current Outpatient Prescriptions  Medication Sig Dispense Refill  . blood glucose meter kit and supplies KIT Dispense based on patient and insurance preference. Use up to three times daily before meals. (FOR ICD-9  250.00, 250.01). Patient given OneTouch Verio glucometer in office. 1 each 0  . calcium carbonate (TUMS - DOSED IN MG ELEMENTAL CALCIUM) 500 MG chewable tablet Chew 1 tablet by mouth every 6 (six) hours as needed for indigestion or heartburn.    . cholecalciferol (VITAMIN D) 400 units TABS tablet Take 1,000 Units by mouth daily.    . citalopram (CELEXA) 10 MG tablet Take 10 mg by mouth daily.    . fenofibrate (TRICOR) 145 MG tablet Take 1 tablet (145 mg total) by mouth daily. (Patient not taking: Reported on 04/19/2016) 30 tablet 3  . glucose blood test strip Onetouch verio  glucometer:Check glucose twice a day (before breakfast and at bedtime). 100 each 3  . ibuprofen (ADVIL,MOTRIN) 600 MG tablet Take 600 mg by mouth every 6 (six) hours as needed.    . insulin glargine (LANTUS) 100 unit/mL SOPN Inject 0.1 mLs (10 Units total) into the skin at bedtime. 15 mL 0  . Insulin Pen Needle (PEN NEEDLES) 31G X 8 MM MISC 1 application by Does not apply route at bedtime. 100 each 3  . lisinopril (PRINIVIL,ZESTRIL) 5 MG tablet Take 1 tablet (5 mg total) by mouth daily. (Patient not taking: Reported on 04/19/2016) 90 tablet 1  . ondansetron (ZOFRAN) 4 MG tablet Take 4 mg by mouth every 8 (eight) hours as needed for nausea or vomiting.    Glory Rosebush DELICA LANCETS 62U MISC 1 application by Does not apply route 2 (two) times daily before a meal. 100 each 3  . SitaGLIPtin-MetFORMIN HCl 50-1000 MG TB24 Take 1 tablet by mouth daily after lunch. (Patient not taking: Reported on 04/19/2016) 60 tablet 0   No current facility-administered medications for this visit.     Review of Systems General: no complaints  HEENT: no complaints  Lungs: no complaints  Cardiac: no complaints  GI: See interval history  GU: See interval history  Musculoskeletal: no complaints  Extremities: no complaints  Skin: no complaints  Neuro: no complaints  Endocrine: no complaints  Psych: no complaints      Objective:  Physical Examination:  There were no vitals taken for this visit.    ECOG Performance Status: 2 - Symptomatic, <50% confined to bed  General appearance: alert, cooperative and appears stated age HEENT:PERRLA, extra ocular movement intact, neck supple with midline trachea and thyroid without masses Lymph node survey: non-palpable inguinal, supraclavicular; she could not tolerate axillary exam Cardiovascular: regular rate and rhythm Respiratory: normal air entry, lungs clear to auscultation  Breast exam: not examined. Abdomen: protuberant, without guarding, no hernias and well healed  incision, no ascites or masses Back: inspection of back is normal Extremities: 1+ edema bilaterally Skin exam - normal coloration and turgor, no rashes, no suspicious skin lesions noted. Neurological exam reveals alert, oriented, normal speech, no focal findings or movement disorder noted.  Pelvic: exam chaperoned by nurse;  Vulva: normal appearing vulva with no masses, tenderness or lesions; Vagina: no gross lesions. Uterus/Cervix: surgically absent. BME limited by habitus but no masses. Rectal: not indicated       Assessment:  Cianni Dorota Heinrichs is a 75 y.o. female diagnosed with benign sex-cord stromal tumor s/p exploratory laparoscopy, total abdominal hysterectomy, bilateral salping oophorectomy, left pelvic lymph node dissection, omentectomy. NED.  Medical comorbidties: morbid obesity with relative immobility and living in a SNF since hospital discharge, hypertension. Noncompliant with medical advice in the past.   Plan:   Problem List Items Addressed This Visit    None  Per Tumor Board discussion she does not need further treatment. The recommendation was for continued surveillance. Plan to follow up in 6 months and if doing well at that time we can begin alternating visits with Dr. Ouida Sills.     The patient's diagnosis, an outline of the further diagnostic and laboratory studies which will be required, the recommendation, and alternatives were discussed. All questions were answered to the patient's satisfaction.    Gillis Ends, MD  CC: Dr. Ouida Sills

## 2016-08-14 ENCOUNTER — Encounter: Payer: Self-pay | Admitting: Physical Therapy

## 2016-08-14 NOTE — Therapy (Signed)
[facility] [address] [place], [place], [address] Phone: [phone]   Fax:  [phone]  Patient Details  Name: [name] MRN: [id number] Date of Birth: 1941/01/05 Referring Provider:  No ref. provider found  Encounter Date: 08/14/2016  PHYSICAL THERAPY DISCHARGE SUMMARY  Visits from Start of Care: 5  Current functional level related to goals / functional outcomes: Unable to assess as pt did not return to PT   Remaining deficits: Unable to assess as pt did not return to PT   Education / Equipment: Unable to assess as pt did not return to PT  Plan: Patient agrees to discharge.  Patient goals were not met. Patient is being discharged due to not returning since the last visit.  ?????      Rexanne Mano, PT 08/14/2016, 8:31 AM  [facility] [address] [place], [place], [address] Phone: [phone]   Fax:  [phone]

## 2016-08-28 ENCOUNTER — Ambulatory Visit: Payer: Medicare Other | Admitting: Nurse Practitioner

## 2017-03-20 IMAGING — US US EXTREM LOW VENOUS*L*
1 series · 14 of 24 positions shown · non-contrast
Comparison: None.

CLINICAL DATA: 74-year-old female with left leg pain and swelling
for 1 month.



[Series 1: us extrem low venous*left* · 0.08mm/px · 14 of 34 slices shown]
[im 1/34]
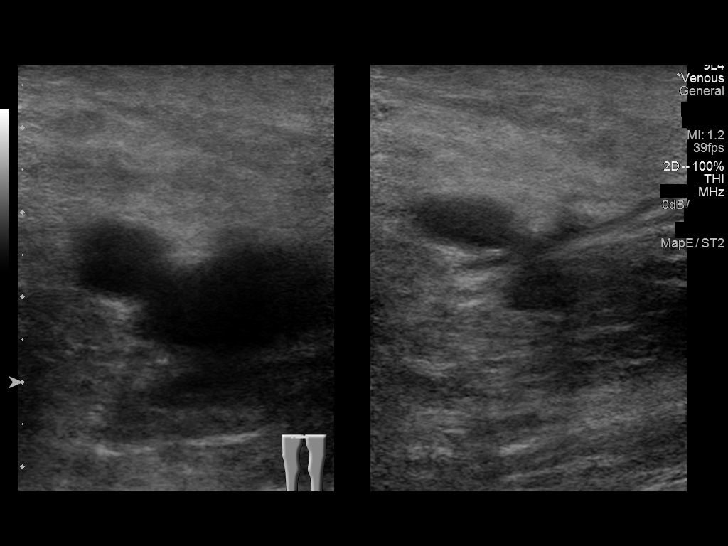
[im 3/34]
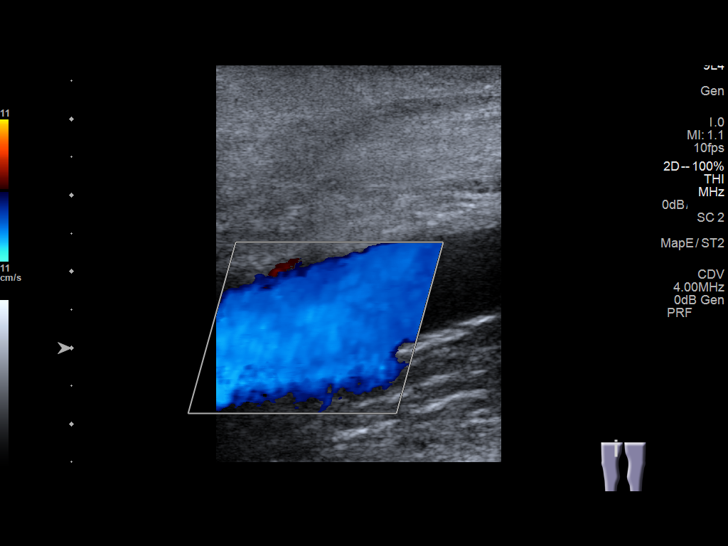
[im 6/34]
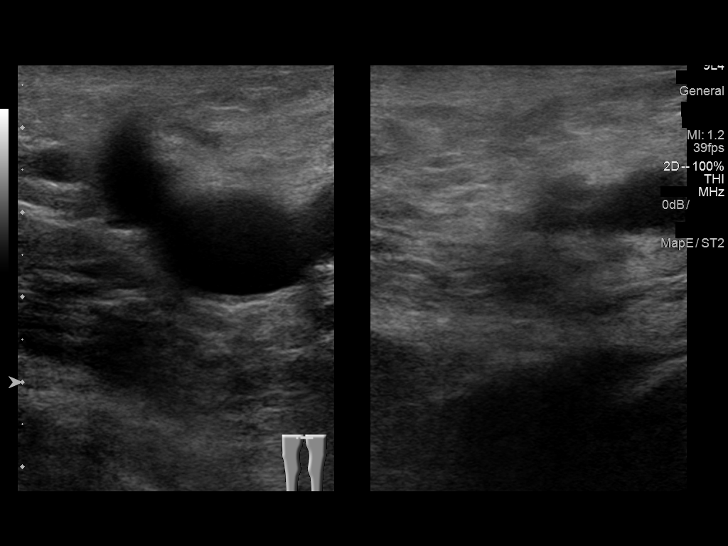
[im 9/34]
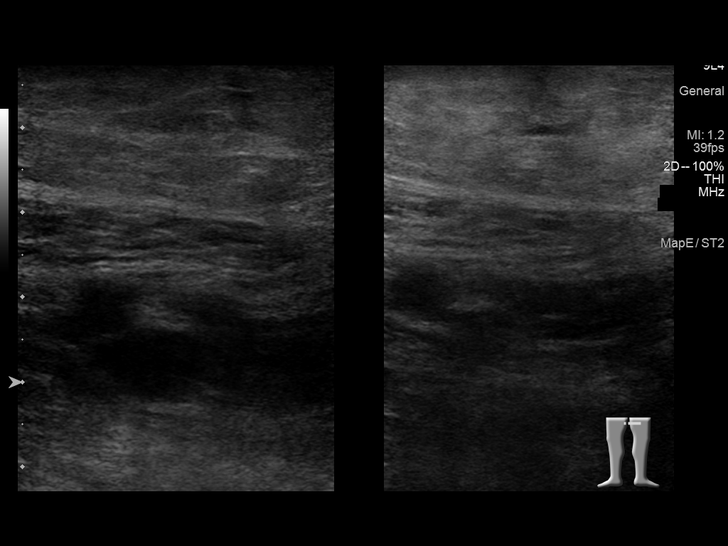
[im 11/34]
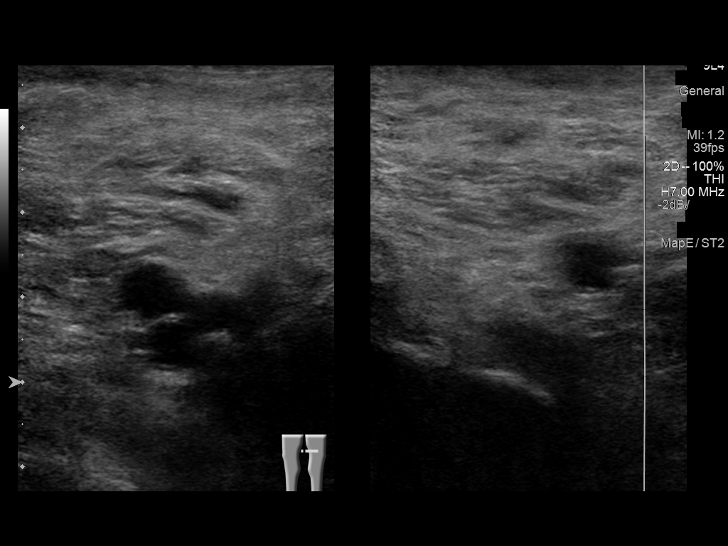
[im 13/34]
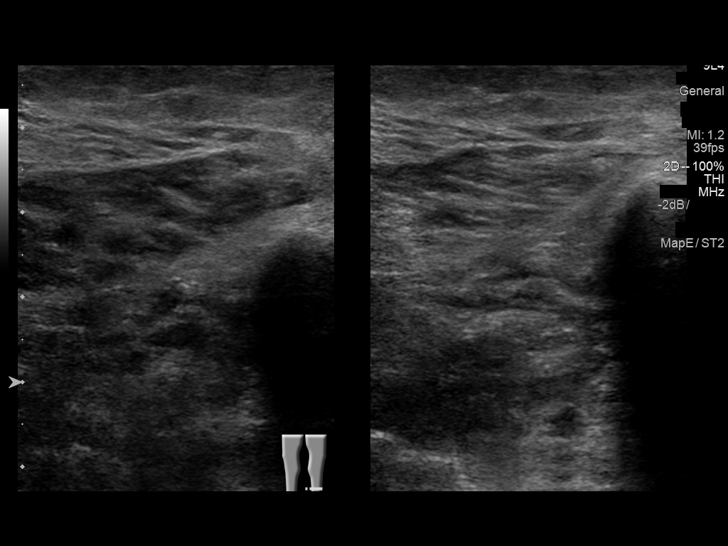
[im 16/34]
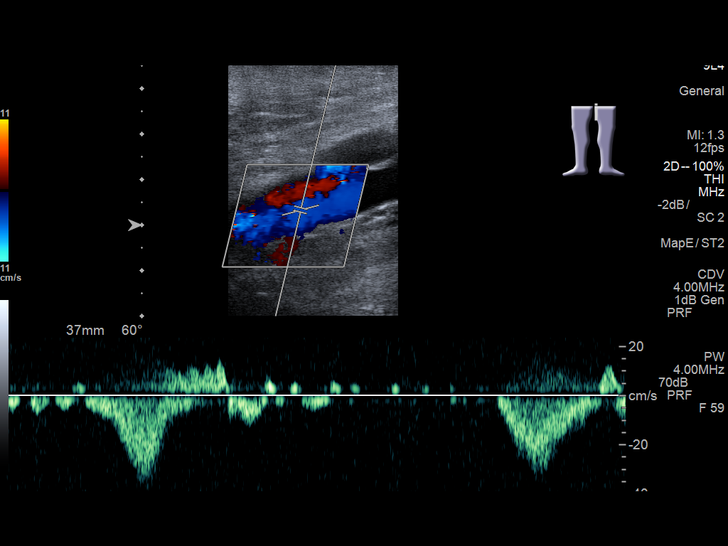
[im 18/34]
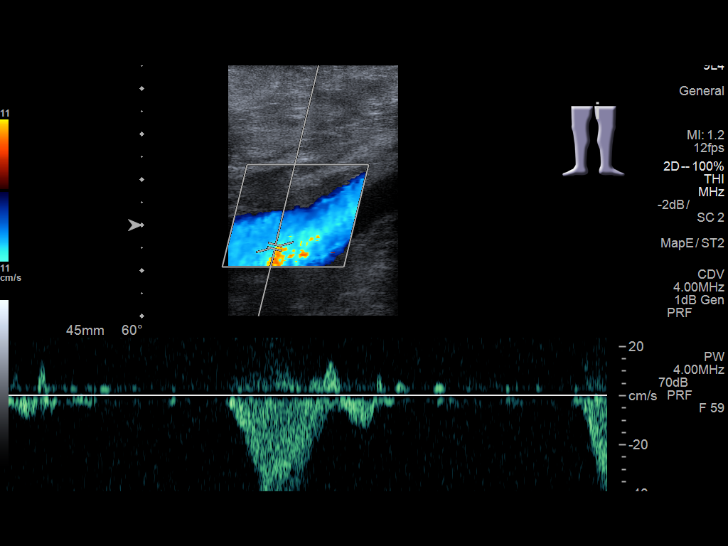
[im 21/34]
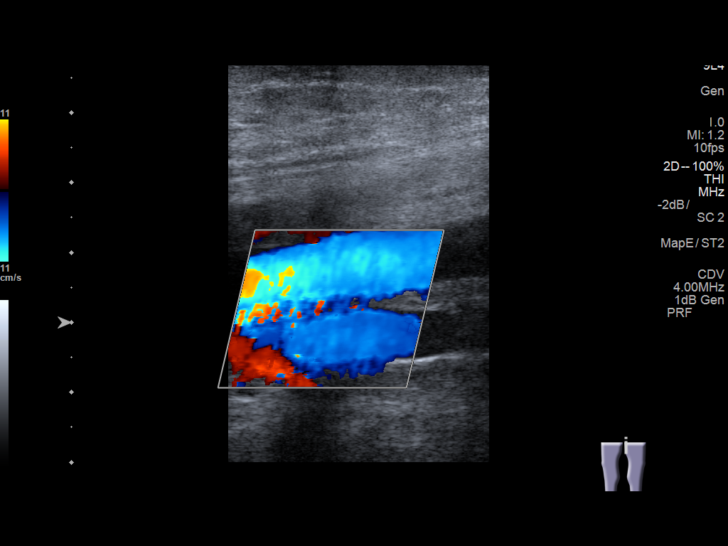
[im 23/34]
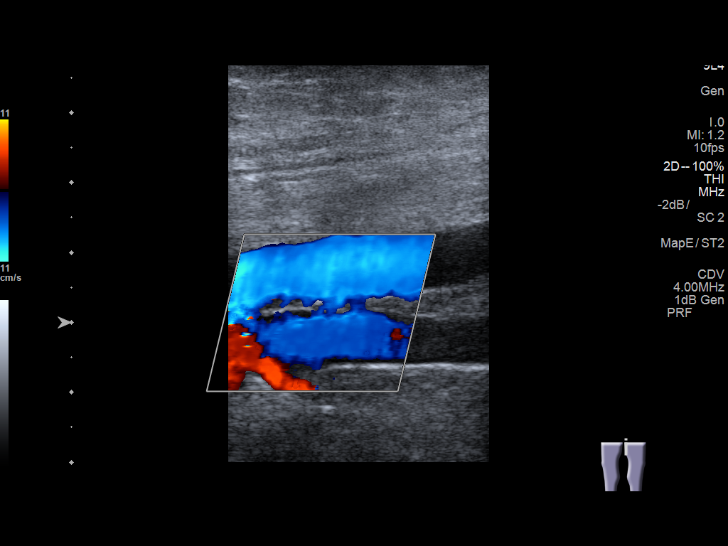
[im 26/34]
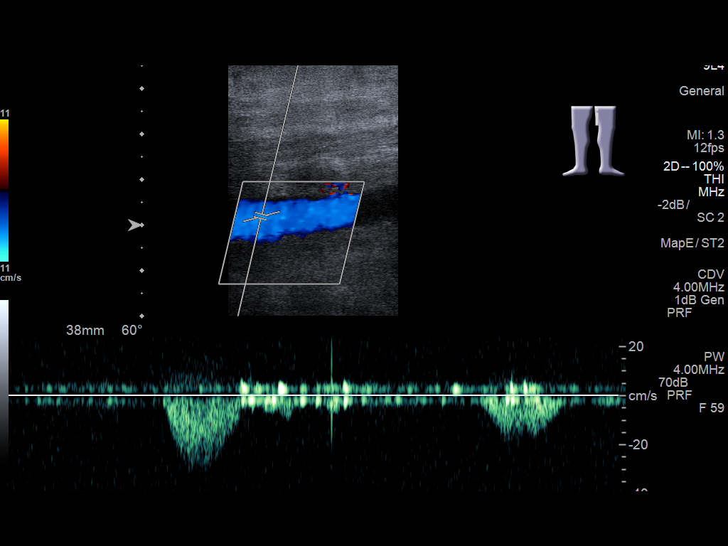
[im 28/34]
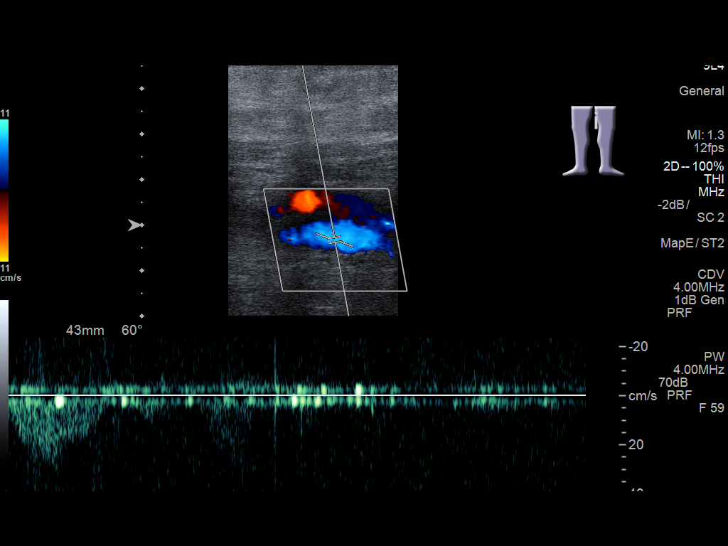
[im 31/34]
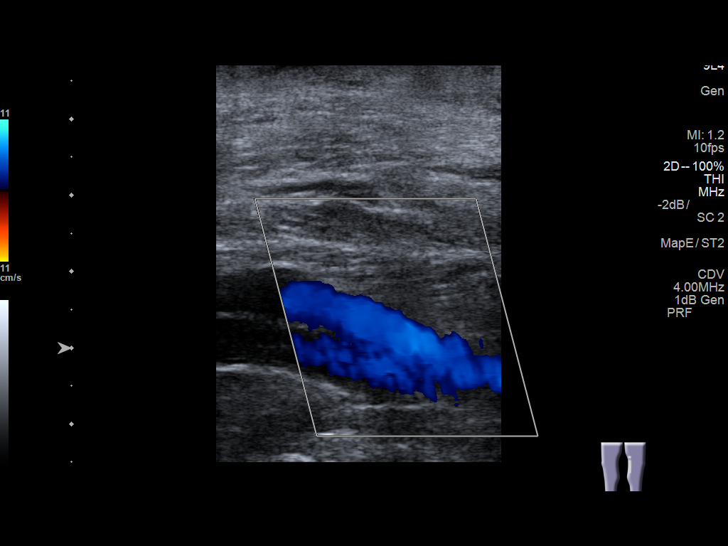
[im 34/34]
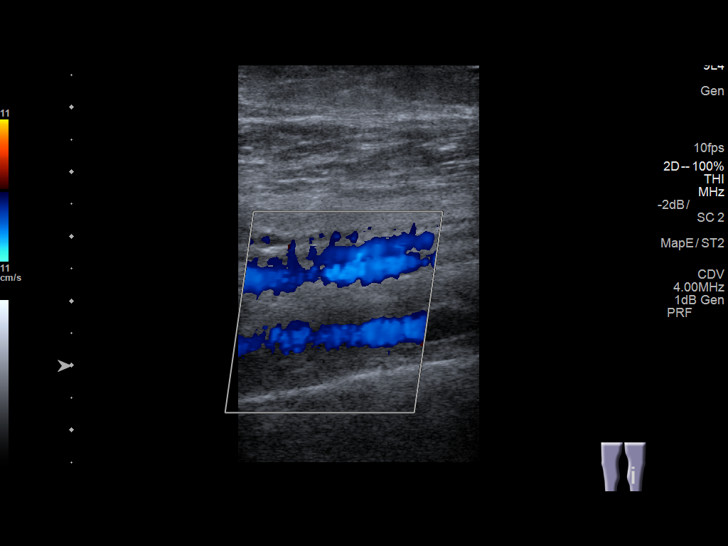

[14 of 24 positions shown; findings below may reference images not displayed]

FINDINGS: Deep venous system appears patent and compressible from groin
through popliteal fossae.

Spontaneous venous flow present with evidence of respiratory
phasicity. Augmentation intact.

No intraluminal thrombus identified.

Visualized portions of the greater saphenous veins patent.

The right common femoral vein is patent without DVT.
IMPRESSION: No evidence of left lower extremity deep venous thrombosis.

## 2018-03-31 ENCOUNTER — Ambulatory Visit: Payer: Medicare Other | Admitting: Nurse Practitioner

## 2018-04-16 ENCOUNTER — Ambulatory Visit: Payer: Medicare Other | Admitting: Nurse Practitioner

## 2018-04-17 ENCOUNTER — Ambulatory Visit (INDEPENDENT_AMBULATORY_CARE_PROVIDER_SITE_OTHER): Payer: Medicare Other

## 2018-04-17 ENCOUNTER — Other Ambulatory Visit: Payer: Self-pay

## 2018-04-17 ENCOUNTER — Ambulatory Visit (INDEPENDENT_AMBULATORY_CARE_PROVIDER_SITE_OTHER): Payer: Medicare Other | Admitting: Podiatry

## 2018-04-17 ENCOUNTER — Other Ambulatory Visit: Payer: Medicare Other

## 2018-04-17 ENCOUNTER — Ambulatory Visit (INDEPENDENT_AMBULATORY_CARE_PROVIDER_SITE_OTHER): Payer: Medicare Other | Admitting: Nurse Practitioner

## 2018-04-17 ENCOUNTER — Encounter: Payer: Self-pay | Admitting: Nurse Practitioner

## 2018-04-17 ENCOUNTER — Ambulatory Visit: Payer: Medicare Other

## 2018-04-17 VITALS — BP 144/82 | HR 117 | Temp 97.9°F | Ht 64.5 in | Wt 204.6 lb

## 2018-04-17 DIAGNOSIS — I10 Essential (primary) hypertension: Secondary | ICD-10-CM

## 2018-04-17 DIAGNOSIS — L03115 Cellulitis of right lower limb: Secondary | ICD-10-CM

## 2018-04-17 DIAGNOSIS — R3915 Urgency of urination: Secondary | ICD-10-CM | POA: Diagnosis not present

## 2018-04-17 DIAGNOSIS — E1165 Type 2 diabetes mellitus with hyperglycemia: Secondary | ICD-10-CM

## 2018-04-17 DIAGNOSIS — S90851A Superficial foreign body, right foot, initial encounter: Secondary | ICD-10-CM

## 2018-04-17 DIAGNOSIS — Z13220 Encounter for screening for lipoid disorders: Secondary | ICD-10-CM

## 2018-04-17 DIAGNOSIS — L089 Local infection of the skin and subcutaneous tissue, unspecified: Secondary | ICD-10-CM

## 2018-04-17 DIAGNOSIS — S90852A Superficial foreign body, left foot, initial encounter: Secondary | ICD-10-CM | POA: Diagnosis not present

## 2018-04-17 DIAGNOSIS — Z136 Encounter for screening for cardiovascular disorders: Secondary | ICD-10-CM

## 2018-04-17 DIAGNOSIS — E611 Iron deficiency: Secondary | ICD-10-CM

## 2018-04-17 DIAGNOSIS — Z794 Long term (current) use of insulin: Secondary | ICD-10-CM

## 2018-04-17 DIAGNOSIS — D539 Nutritional anemia, unspecified: Secondary | ICD-10-CM

## 2018-04-17 DIAGNOSIS — M79671 Pain in right foot: Secondary | ICD-10-CM | POA: Diagnosis not present

## 2018-04-17 DIAGNOSIS — Z780 Asymptomatic menopausal state: Secondary | ICD-10-CM | POA: Diagnosis not present

## 2018-04-17 LAB — LIPID PANEL
CHOLESTEROL: 194 mg/dL (ref 0–200)
HDL: 46.1 mg/dL (ref >39.00)
LDL Cholesterol: 126 mg/dL — ABNORMAL HIGH (ref 0–99)
NonHDL: 147.99
Total CHOL/HDL Ratio: 4
Triglycerides: 111 mg/dL (ref 0.0–149.0)
VLDL: 22.2 mg/dL (ref 0.0–40.0)

## 2018-04-17 LAB — CBC WITH DIFFERENTIAL/PLATELET
BASOS ABS: 0.1 10*3/uL (ref 0.0–0.1)
Basophils Relative: 0.7 % (ref 0.0–3.0)
Eosinophils Absolute: 0.1 10*3/uL (ref 0.0–0.7)
Eosinophils Relative: 0.5 % (ref 0.0–5.0)
HCT: 45.5 % (ref 36.0–46.0)
Hemoglobin: 15.5 g/dL — ABNORMAL HIGH (ref 12.0–15.0)
Lymphocytes Relative: 9.3 % — ABNORMAL LOW (ref 12.0–46.0)
Lymphs Abs: 1.2 10*3/uL (ref 0.7–4.0)
MCHC: 34.1 g/dL (ref 30.0–36.0)
MCV: 82.7 fl (ref 78.0–100.0)
MONOS PCT: 7.9 % (ref 3.0–12.0)
Monocytes Absolute: 1 10*3/uL (ref 0.1–1.0)
NEUTROS ABS: 10.3 10*3/uL — AB (ref 1.4–7.7)
NEUTROS PCT: 81.6 % — AB (ref 43.0–77.0)
Platelets: 326 10*3/uL (ref 150.0–400.0)
RBC: 5.5 Mil/uL — ABNORMAL HIGH (ref 3.87–5.11)
RDW: 14.2 % (ref 11.5–15.5)
WBC: 12.6 10*3/uL — ABNORMAL HIGH (ref 4.0–10.5)

## 2018-04-17 LAB — COMPREHENSIVE METABOLIC PANEL
ALT: 11 U/L (ref 0–35)
AST: 10 U/L (ref 0–37)
Albumin: 4 g/dL (ref 3.5–5.2)
Alkaline Phosphatase: 91 U/L (ref 39–117)
BUN: 8 mg/dL (ref 6–23)
CALCIUM: 9.5 mg/dL (ref 8.4–10.5)
CO2: 28 meq/L (ref 19–32)
CREATININE: 0.88 mg/dL (ref 0.40–1.20)
Chloride: 96 mEq/L (ref 96–112)
GFR: 75.58 mL/min (ref >60.00)
Glucose, Bld: 341 mg/dL — ABNORMAL HIGH (ref 70–99)
Potassium: 3.7 mEq/L (ref 3.5–5.1)
SODIUM: 136 meq/L (ref 135–145)
Total Bilirubin: 1.3 mg/dL — ABNORMAL HIGH (ref 0.2–1.2)
Total Protein: 7.6 g/dL (ref 6.0–8.3)

## 2018-04-17 LAB — GLUCOSE, POCT (MANUAL RESULT ENTRY): POC Glucose: 348 mg/dl — AB (ref 70–99)

## 2018-04-17 LAB — B12 AND FOLATE PANEL
Folate: 12.5 ng/mL (ref >5.9)
VITAMIN B 12: 825 pg/mL (ref 211–911)

## 2018-04-17 LAB — TSH: TSH: 1.74 u[IU]/mL (ref 0.35–4.50)

## 2018-04-17 MED ORDER — FREESTYLE LIBRE 14 DAY SENSOR MISC: 1.0000 [IU] | 2 refills | Status: AC

## 2018-04-17 MED ORDER — FREESTYLE LIBRE READER DEVI: 1.0000 [IU] | 0 refills | Status: AC

## 2018-04-17 MED ORDER — INSULIN GLARGINE 100 UNITS/ML SOLOSTAR PEN: 10.0000 [IU] | PEN_INJECTOR | Freq: Every day | SUBCUTANEOUS | 0 refills | Status: DC

## 2018-04-17 MED ORDER — PEN NEEDLES 31G X 8 MM MISC: 1.0000 "application " | Freq: Every day | 3 refills | Status: DC

## 2018-04-17 MED ORDER — CLINDAMYCIN HCL 300 MG PO CAPS: 300.0000 mg | ORAL_CAPSULE | Freq: Three times a day (TID) | ORAL | 0 refills | Status: DC

## 2018-04-17 NOTE — Progress Notes (Addendum)
Subjective:  Patient ID: [name], female    DOB: 10-14-1940  Age: 78 y.o. MRN: [id number]  CC: Follow-up (pt is c/o of right foot painful due to step on little glass,swelling little. 2 days ago. clean it with perexide and alcohol at home. ) and Urinary Incontinence (pt is c/o of urine leak,cant control,going on 2 wks. )  [name] is here to reestablish care. She was last seen 2018. Denies establishing with anyone else, hence has not taken any medication for over 1year.  Foot Injury   Injury mechanism: stepped on piece of broken glass. The pain is present in the right foot. The quality of the pain is described as shooting and stabbing. The pain is severe. The pain has been constant since onset. Associated symptoms include a loss of motion. Pertinent negatives include no inability to bear weight, loss of sensation, muscle weakness, numbness or tingling. Possible foreign bodies include glass. The symptoms are aggravated by movement, palpation and weight bearing. She has tried nothing for the symptoms.  Urinary Frequency   This is a new problem. The current episode started 1 to 4 weeks ago. The problem occurs every urination. The problem has been unchanged. The quality of the pain is described as burning. There has been no fever. She is not sexually active. There is no history of pyelonephritis. Associated symptoms include frequency and urgency. Pertinent negatives include no chills, discharge, flank pain, hematuria or possible pregnancy. She has tried nothing for the symptoms.  reports puncture wound x 2weeks.  DM: Discontinue medications over 1year ago. Reports eye exam done last week.  Reviewed past Medical, Social and Family history today.  No facility-administered medications prior to visit.    Outpatient Medications Prior to Visit  Medication Sig Dispense Refill  . calcium carbonate (TUMS - DOSED IN MG ELEMENTAL CALCIUM) 500 MG chewable tablet Chew 1 tablet by mouth every 6  (six) hours as needed for indigestion or heartburn.    Marland Kitchen ibuprofen (ADVIL,MOTRIN) 600 MG tablet Take 600 mg by mouth every 6 (six) hours as needed.    . cholecalciferol (VITAMIN D) 400 units TABS tablet Take 1,000 Units by mouth daily.    . citalopram (CELEXA) 10 MG tablet Take 10 mg by mouth daily.    . fenofibrate (TRICOR) 145 MG tablet Take 1 tablet (145 mg total) by mouth daily. 30 tablet 3  . ondansetron (ZOFRAN) 4 MG tablet Take 4 mg by mouth every 8 (eight) hours as needed for nausea or vomiting.    . SitaGLIPtin-MetFORMIN HCl 50-1000 MG TB24 Take 1 tablet by mouth daily after lunch. 60 tablet 0  . blood glucose meter kit and supplies KIT Dispense based on patient and insurance preference. Use up to three times daily before meals. (FOR ICD-9 250.00, 250.01). Patient given OneTouch Verio glucometer in office. (Patient not taking: Reported on 04/17/2018) 1 each 0  . glucose blood test strip Onetouch verio glucometer:Check glucose twice a day (before breakfast and at bedtime). (Patient not taking: Reported on 04/17/2018) 100 each 3  . insulin glargine (LANTUS) 100 unit/mL SOPN Inject 0.1 mLs (10 Units total) into the skin at bedtime. (Patient not taking: Reported on 04/17/2018) 15 mL 0  . Insulin Pen Needle (PEN NEEDLES) 31G X 8 MM MISC 1 application by Does not apply route at bedtime. (Patient not taking: Reported on 04/17/2018) 100 each 3  . lisinopril (PRINIVIL,ZESTRIL) 5 MG tablet Take 1 tablet (5 mg total) by mouth daily. 90 tablet 1  .  ONETOUCH DELICA LANCETS 93J MISC 1 application by Does not apply route 2 (two) times daily before a meal. (Patient not taking: Reported on 04/17/2018) 100 each 3    ROS See HPI  Objective:  BP (!) 144/82   Pulse (!) 117   Temp 97.9 F (36.6 C) (Oral)   Ht 5' 4.5" (1.638 m)   Wt 204 lb 9.6 oz (92.8 kg)   SpO2 97%   BMI 34.58 kg/m   BP Readings from Last 3 Encounters:  04/25/18 129/86  04/17/18 (!) 144/82  04/19/16 140/86    Wt Readings from Last  3 Encounters:  04/25/18 202 lb 13.2 oz (92 kg)  04/17/18 204 lb 9.6 oz (92.8 kg)  04/19/16 224 lb (101.6 kg)    Physical Exam Vitals signs reviewed.  Constitutional:      General: She is not in acute distress. Cardiovascular:     Rate and Rhythm: Normal rate and regular rhythm.     Heart sounds: Normal heart sounds.  Pulmonary:     Effort: Pulmonary effort is normal.     Breath sounds: Normal breath sounds.  Musculoskeletal:        General: Swelling and tenderness present.     Right lower leg: Edema present.     Left lower leg: Edema present.       Feet:     Comments: Unable to bear weight on right foot  Skin:    Findings: Erythema present.  Neurological:     Mental Status: She is alert and oriented to person, place, and time.  Psychiatric:        Thought Content: Thought content normal.     Lab Results  Component Value Date   WBC 7.6 04/25/2018   HGB 13.1 04/25/2018   HCT 36.4 04/25/2018   PLT 455 (H) 04/25/2018   GLUCOSE 113 (H) 04/25/2018   CHOL 194 04/17/2018   TRIG 111.0 04/17/2018   HDL 46.10 04/17/2018   LDLDIRECT 139.0 03/06/2016   LDLCALC 126 (H) 04/17/2018   ALT 18 04/20/2018   AST 18 04/20/2018   NA 143 04/25/2018   K 3.1 (L) 04/25/2018   CL 102 04/25/2018   CREATININE 0.93 04/25/2018   BUN 5 (L) 04/25/2018   CO2 28 04/25/2018   TSH 1.74 04/17/2018   INR 1.1 07/26/2013   HGBA1C 13.5 (H) 04/17/2018     Assessment & Plan:   [name] was seen today for follow-up and urinary incontinence.  Diagnoses and all orders for this visit:  Type 2 diabetes mellitus with hyperglycemia, with long-term current use of insulin (HCC) -     Comprehensive metabolic panel -     Hemoglobin A1c -     Microalbumin / creatinine urine ratio -     POCT Glucose (CBG) -     Ambulatory referral to Podiatry -     DG Foot Complete Right -     Continuous Blood Gluc Receiver (FREESTYLE LIBRE READER) DEVI; 1 Units by Does not apply route every 14 (fourteen) days. -      Continuous Blood Gluc Sensor (FREESTYLE LIBRE 14 DAY SENSOR) MISC; 1 Units by Does not apply route every 14 (fourteen) days. -     insulin glargine (LANTUS) 100 unit/mL SOPN; Inject 0.1 mLs (10 Units total) into the skin at bedtime. -     Insulin Pen Needle (PEN NEEDLES) 31G X 8 MM MISC; 1 application by Does not apply route at bedtime. -     insulin lispro protamine-lispro (  HUMALOG 75/25 MIX) (75-25) 100 UNIT/ML SUSP injection; Inject 10 Units into the skin 2 (two) times daily with a meal. -     Insulin Syringe-Needle U-100 (INSULIN SYRINGE .5CC/30GX1/2") 30G X 1/2" 0.5 ML MISC; 1 Units by Does not apply route 2 (two) times daily after a meal.  Essential hypertension -     Comprehensive metabolic panel -     TSH -     lisinopril (PRINIVIL,ZESTRIL) 5 MG tablet; Take 1 tablet (5 mg total) by mouth daily.  Anemia, macrocytic -     B12 and Folate Panel -     CBC w/Diff -     Iron, TIBC and Ferritin Panel -     Ambulatory referral to Gastroenterology  Encounter for lipid screening for cardiovascular disease -     Lipid panel  Asymptomatic postmenopausal estrogen deficiency -     DG Bone Density; Future  Urinary urgency -     Cancel: Urinalysis w microscopic + reflex cultur  Cellulitis of right leg -     Ambulatory referral to Podiatry -     DG Foot Complete Right -     clindamycin (CLEOCIN) 300 MG capsule; Take 1 capsule (300 mg total) by mouth 3 (three) times daily.  Foreign body in foot, right, infected, initial encounter -     Ambulatory referral to [place] Complete Right -     clindamycin (CLEOCIN) 300 MG capsule; Take 1 capsule (300 mg total) by mouth 3 (three) times daily.  Iron deficiency -     Ambulatory referral to Gastroenterology  Other orders -     Urinalysis w microscopic + reflex cultur   I have discontinued Arvis [employer] Segler's OneTouch Delica Lancets 16W, glucose blood, and blood glucose meter kit and supplies. I am also having her start on  FreeStyle Libre Reader, [employer] 14 Day Sensor, clindamycin, insulin lispro protamine-lispro, and INSULIN SYRINGE .5CC/30GX1/2". Additionally, I am having her maintain her calcium carbonate, ondansetron, cholecalciferol, citalopram, ibuprofen, SitaGLIPtin-MetFORMIN HCl, fenofibrate, insulin glargine, Pen Needles, and lisinopril.  Meds ordered this encounter  Medications  . Continuous Blood Gluc Receiver (FREESTYLE LIBRE READER) DEVI    Sig: 1 Units by Does not apply route every 14 (fourteen) days.    Dispense:  1 Device    Refill:  0    Order Specific Question:   Supervising Provider    Answer:   MATTHEWS, CODY [[id number]]  . Continuous Blood Gluc Sensor (FREESTYLE LIBRE 14 DAY SENSOR) MISC    Sig: 1 Units by Does not apply route every 14 (fourteen) days.    Dispense:  2 each    Refill:  2    Order Specific Question:   Supervising Provider    Answer:   MATTHEWS, CODY [[id number]]  . insulin glargine (LANTUS) 100 unit/mL SOPN    Sig: Inject 0.1 mLs (10 Units total) into the skin at bedtime.    Dispense:  15 mL    Refill:  0    Order Specific Question:   Supervising Provider    Answer:   MATTHEWS, CODY [[id number]]  . Insulin Pen Needle (PEN NEEDLES) 31G X 8 MM MISC    Sig: 1 application by Does not apply route at bedtime.    Dispense:  100 each    Refill:  3    Order Specific Question:   Supervising Provider    Answer:   MATTHEWS, CODY [[id number]]  . clindamycin (CLEOCIN) 300 MG capsule  Sig: Take 1 capsule (300 mg total) by mouth 3 (three) times daily.    Dispense:  30 capsule    Refill:  0    Order Specific Question:   Supervising Provider    Answer:   MATTHEWS, CODY [[id number]]  . insulin lispro protamine-lispro (HUMALOG 75/25 MIX) (75-25) 100 UNIT/ML SUSP injection    Sig: Inject 10 Units into the skin 2 (two) times daily with a meal.    Dispense:  10 mL    Refill:  1    Order Specific Question:   Supervising Provider    Answer:   MATTHEWS, CODY [[id number]]  . Insulin Syringe-Needle U-100  (INSULIN SYRINGE .5CC/30GX1/2") 30G X 1/2" 0.5 ML MISC    Sig: 1 Units by Does not apply route 2 (two) times daily after a meal.    Dispense:  90 each    Refill:  3    Order Specific Question:   Supervising Provider    Answer:   MATTHEWS, CODY [[id number]]  . lisinopril (PRINIVIL,ZESTRIL) 5 MG tablet    Sig: Take 1 tablet (5 mg total) by mouth daily.    Dispense:  90 tablet    Refill:  3    Order Specific Question:   Supervising Provider    Answer:   MATTHEWS, CODY [[id number]]    Problem List Items Addressed This Visit      Cardiovascular and Mediastinum   Essential hypertension   Relevant Medications   lisinopril (PRINIVIL,ZESTRIL) 5 MG tablet   Other Relevant Orders   Comprehensive metabolic panel (Completed)   TSH (Completed)     Endocrine   Uncontrolled diabetes mellitus ([place]) - Primary   Relevant Medications   Continuous Blood Gluc Receiver (FREESTYLE LIBRE READER) DEVI   Continuous Blood Gluc Sensor (FREESTYLE LIBRE 14 DAY SENSOR) MISC   insulin glargine (LANTUS) 100 unit/mL SOPN   Insulin Pen Needle (PEN NEEDLES) 31G X 8 MM MISC   insulin lispro protamine-lispro (HUMALOG 75/25 MIX) (75-25) 100 UNIT/ML SUSP injection   Insulin Syringe-Needle U-100 (INSULIN SYRINGE .5CC/30GX1/2") 30G X 1/2" 0.5 ML MISC   lisinopril (PRINIVIL,ZESTRIL) 5 MG tablet     Other   Anemia   Cellulitis of right leg   Relevant Medications   clindamycin (CLEOCIN) 300 MG capsule   Other Relevant Orders   Ambulatory referral to Podiatry   DG Foot Complete Right (Completed)   Foreign body in right foot   Relevant Medications   clindamycin (CLEOCIN) 300 MG capsule   Iron deficiency   Relevant Orders   Ambulatory referral to Gastroenterology    Other Visit Diagnoses    Encounter for lipid screening for cardiovascular disease       Relevant Orders   Lipid panel (Completed)   Asymptomatic postmenopausal estrogen deficiency       Relevant Orders   DG Bone Density   Urinary urgency            Follow-up: Return in about 2 weeks (around 05/01/2018) for DM and HTN (37mns).  CWilfred Lacy NP

## 2018-04-17 NOTE — Patient Instructions (Addendum)
Uncontrolled DM with high glucose and hgbA1c of 13 Need to start lantus at 10units at bedtime and humalog 75/25 at 10units BID with meals. Check glucose before breakfast and before supper. It is imperative that you return to podiatry to have glass removed from you foot. If not done, you stand a risk of further complications like osteomyelitis. This can lead to amputation of limb and sepsis  Cbc and iron panel indicates possible blood loss through GI tract. Entered referral to GI for further evaluation. Elevated WBC due to current infection. Normal B12 and folate.  Resume lisinopril for HTN  Go to [facility] at 2:30pm today for right foot cellulitis and possible debridement. [facility], [place], [place] [address] [phone]  Need to start glucose check before breakfast and before supper. Bring medications and glucose machine for next office visit. Start lantus 10units at bedtime  X-ray confirms foreign object in foot with soft tissue swelling. It is very important to maintain appt with podiatry today to remove object. I also sent oral abx for treat cellulitis. Pending lab results.

## 2018-04-17 NOTE — Progress Notes (Signed)
Subjective:   Patient ID: [name], female   DOB: 78 y.o.   MRN: [id number]   HPI Patient presents in difficult mental condition with apparently having stepped on a piece of glass left plantar foot who is not giving me good information and upon checking her sugar it was 400   Review of Systems  All other systems reviewed and are negative.       Objective:  Physical Exam Vitals signs and nursing note reviewed.  Constitutional:      Appearance: She is well-developed.  Pulmonary:     Effort: Pulmonary effort is normal.  Musculoskeletal: Normal range of motion.  Skin:    General: Skin is warm.  Neurological:     Mental Status: She is alert.     I did note vascular status was reduced with swelling of the right lateral foot.  Patient was not easy to talk to when she was moderately incoherent at the current time and she did have a lot of pain associated with this and was in a wheelchair    Assessment:  Possibility for foreign body right versus other inflammatory condition with no proximal edema erythema or drainage noted     Plan:  Reviewed condition with her and she refused x-ray and I then went ahead and I did check her sugar which was quite high and I advised the consideration for emergency room but with the coronavirus this will be difficult.  I did do a sterile block of the area with 60 mg [name] Marcaine mixture sterile prep and before I was able to do the procedure she on her own chose to leave and discharge herself from our service.  We were unable to convince her to stay and I did advise her she should go to the emergency room to have this worked on and especially with her sugar being so high

## 2018-04-18 ENCOUNTER — Encounter: Payer: Self-pay | Admitting: Nurse Practitioner

## 2018-04-18 DIAGNOSIS — E611 Iron deficiency: Secondary | ICD-10-CM | POA: Insufficient documentation

## 2018-04-18 DIAGNOSIS — S90851A Superficial foreign body, right foot, initial encounter: Secondary | ICD-10-CM | POA: Insufficient documentation

## 2018-04-18 DIAGNOSIS — L03115 Cellulitis of right lower limb: Secondary | ICD-10-CM | POA: Insufficient documentation

## 2018-04-18 LAB — HEMOGLOBIN A1C
Hgb A1c MFr Bld: 13.5 % of total Hgb — ABNORMAL HIGH (ref <5.7)
Mean Plasma Glucose: 341 (calc)
eAG (mmol/L): 18.9 (calc)

## 2018-04-18 MED ORDER — INSULIN LISPRO PROT & LISPRO (75-25 MIX) 100 UNIT/ML ~~LOC~~ SUSP: 10.0000 [IU] | Freq: Two times a day (BID) | SUBCUTANEOUS | 1 refills | Status: DC

## 2018-04-18 MED ORDER — "INSULIN SYRINGE 30G X 1/2"" 0.5 ML MISC": 1.0000 [IU] | Freq: Two times a day (BID) | 3 refills | Status: DC

## 2018-04-18 MED ORDER — LISINOPRIL 5 MG PO TABS: 5.0000 mg | ORAL_TABLET | Freq: Every day | ORAL | 3 refills | Status: DC

## 2018-04-20 ENCOUNTER — Inpatient Hospital Stay (HOSPITAL_COMMUNITY)
Admission: EM | Admit: 2018-04-20 | Discharge: 2018-04-26 | DRG: 854 | Disposition: A | Discharge status: Skilled Nursing Facility | Payer: Medicare Other | Attending: Internal Medicine | Admitting: Internal Medicine

## 2018-04-20 ENCOUNTER — Encounter (HOSPITAL_COMMUNITY): Payer: Self-pay

## 2018-04-20 ENCOUNTER — Inpatient Hospital Stay (HOSPITAL_COMMUNITY): Payer: Medicare Other

## 2018-04-20 ENCOUNTER — Other Ambulatory Visit: Payer: Self-pay

## 2018-04-20 DIAGNOSIS — E1152 Type 2 diabetes mellitus with diabetic peripheral angiopathy with gangrene: Secondary | ICD-10-CM | POA: Diagnosis present

## 2018-04-20 DIAGNOSIS — Z79899 Other long term (current) drug therapy: Secondary | ICD-10-CM

## 2018-04-20 DIAGNOSIS — E11628 Type 2 diabetes mellitus with other skin complications: Secondary | ICD-10-CM | POA: Diagnosis present

## 2018-04-20 DIAGNOSIS — L97519 Non-pressure chronic ulcer of other part of right foot with unspecified severity: Secondary | ICD-10-CM | POA: Diagnosis not present

## 2018-04-20 DIAGNOSIS — Z6834 Body mass index (BMI) 34.0-34.9, adult: Secondary | ICD-10-CM | POA: Diagnosis not present

## 2018-04-20 DIAGNOSIS — R2689 Other abnormalities of gait and mobility: Secondary | ICD-10-CM | POA: Diagnosis not present

## 2018-04-20 DIAGNOSIS — B9562 Methicillin resistant Staphylococcus aureus infection as the cause of diseases classified elsewhere: Secondary | ICD-10-CM | POA: Diagnosis present

## 2018-04-20 DIAGNOSIS — E785 Hyperlipidemia, unspecified: Secondary | ICD-10-CM | POA: Diagnosis not present

## 2018-04-20 DIAGNOSIS — Z8543 Personal history of malignant neoplasm of ovary: Secondary | ICD-10-CM

## 2018-04-20 DIAGNOSIS — F419 Anxiety disorder, unspecified: Secondary | ICD-10-CM | POA: Diagnosis present

## 2018-04-20 DIAGNOSIS — L02611 Cutaneous abscess of right foot: Secondary | ICD-10-CM | POA: Diagnosis present

## 2018-04-20 DIAGNOSIS — Z9841 Cataract extraction status, right eye: Secondary | ICD-10-CM | POA: Diagnosis not present

## 2018-04-20 DIAGNOSIS — Z794 Long term (current) use of insulin: Secondary | ICD-10-CM | POA: Diagnosis not present

## 2018-04-20 DIAGNOSIS — A4101 Sepsis due to Methicillin susceptible Staphylococcus aureus: Secondary | ICD-10-CM | POA: Diagnosis not present

## 2018-04-20 DIAGNOSIS — Z9842 Cataract extraction status, left eye: Secondary | ICD-10-CM | POA: Diagnosis not present

## 2018-04-20 DIAGNOSIS — E1365 Other specified diabetes mellitus with hyperglycemia: Secondary | ICD-10-CM

## 2018-04-20 DIAGNOSIS — B9561 Methicillin susceptible Staphylococcus aureus infection as the cause of diseases classified elsewhere: Secondary | ICD-10-CM | POA: Diagnosis not present

## 2018-04-20 DIAGNOSIS — L089 Local infection of the skin and subcutaneous tissue, unspecified: Secondary | ICD-10-CM | POA: Diagnosis not present

## 2018-04-20 DIAGNOSIS — R000 Tachycardia, unspecified: Secondary | ICD-10-CM | POA: Diagnosis not present

## 2018-04-20 DIAGNOSIS — R5381 Other malaise: Secondary | ICD-10-CM | POA: Diagnosis not present

## 2018-04-20 DIAGNOSIS — Z978 Presence of other specified devices: Secondary | ICD-10-CM | POA: Diagnosis not present

## 2018-04-20 DIAGNOSIS — N179 Acute kidney failure, unspecified: Secondary | ICD-10-CM

## 2018-04-20 DIAGNOSIS — E669 Obesity, unspecified: Secondary | ICD-10-CM | POA: Diagnosis present

## 2018-04-20 DIAGNOSIS — Y999 Unspecified external cause status: Secondary | ICD-10-CM | POA: Diagnosis not present

## 2018-04-20 DIAGNOSIS — R279 Unspecified lack of coordination: Secondary | ICD-10-CM | POA: Diagnosis not present

## 2018-04-20 DIAGNOSIS — K219 Gastro-esophageal reflux disease without esophagitis: Secondary | ICD-10-CM | POA: Diagnosis present

## 2018-04-20 DIAGNOSIS — M79671 Pain in right foot: Secondary | ICD-10-CM | POA: Diagnosis not present

## 2018-04-20 DIAGNOSIS — E11621 Type 2 diabetes mellitus with foot ulcer: Secondary | ICD-10-CM | POA: Diagnosis present

## 2018-04-20 DIAGNOSIS — Z8719 Personal history of other diseases of the digestive system: Secondary | ICD-10-CM

## 2018-04-20 DIAGNOSIS — L02619 Cutaneous abscess of unspecified foot: Secondary | ICD-10-CM | POA: Diagnosis present

## 2018-04-20 DIAGNOSIS — I10 Essential (primary) hypertension: Secondary | ICD-10-CM | POA: Diagnosis present

## 2018-04-20 DIAGNOSIS — M795 Residual foreign body in soft tissue: Secondary | ICD-10-CM | POA: Diagnosis not present

## 2018-04-20 DIAGNOSIS — F329 Major depressive disorder, single episode, unspecified: Secondary | ICD-10-CM | POA: Diagnosis present

## 2018-04-20 DIAGNOSIS — E08641 Diabetes mellitus due to underlying condition with hypoglycemia with coma: Secondary | ICD-10-CM

## 2018-04-20 DIAGNOSIS — E1165 Type 2 diabetes mellitus with hyperglycemia: Secondary | ICD-10-CM

## 2018-04-20 DIAGNOSIS — W25XXXA Contact with sharp glass, initial encounter: Secondary | ICD-10-CM | POA: Diagnosis present

## 2018-04-20 DIAGNOSIS — Y939 Activity, unspecified: Secondary | ICD-10-CM | POA: Diagnosis not present

## 2018-04-20 DIAGNOSIS — M6281 Muscle weakness (generalized): Secondary | ICD-10-CM | POA: Diagnosis not present

## 2018-04-20 DIAGNOSIS — Y929 Unspecified place or not applicable: Secondary | ICD-10-CM

## 2018-04-20 DIAGNOSIS — Z90710 Acquired absence of both cervix and uterus: Secondary | ICD-10-CM

## 2018-04-20 DIAGNOSIS — Z743 Need for continuous supervision: Secondary | ICD-10-CM | POA: Diagnosis not present

## 2018-04-20 DIAGNOSIS — E538 Deficiency of other specified B group vitamins: Secondary | ICD-10-CM | POA: Diagnosis present

## 2018-04-20 DIAGNOSIS — Z90722 Acquired absence of ovaries, bilateral: Secondary | ICD-10-CM

## 2018-04-20 DIAGNOSIS — E1142 Type 2 diabetes mellitus with diabetic polyneuropathy: Secondary | ICD-10-CM

## 2018-04-20 DIAGNOSIS — W458XXA Other foreign body or object entering through skin, initial encounter: Secondary | ICD-10-CM | POA: Diagnosis present

## 2018-04-20 DIAGNOSIS — R609 Edema, unspecified: Secondary | ICD-10-CM | POA: Diagnosis not present

## 2018-04-20 DIAGNOSIS — F0390 Unspecified dementia without behavioral disturbance: Secondary | ICD-10-CM | POA: Diagnosis not present

## 2018-04-20 DIAGNOSIS — I358 Other nonrheumatic aortic valve disorders: Secondary | ICD-10-CM | POA: Diagnosis present

## 2018-04-20 DIAGNOSIS — S90851A Superficial foreign body, right foot, initial encounter: Secondary | ICD-10-CM | POA: Diagnosis not present

## 2018-04-20 DIAGNOSIS — B951 Streptococcus, group B, as the cause of diseases classified elsewhere: Secondary | ICD-10-CM | POA: Diagnosis not present

## 2018-04-20 DIAGNOSIS — Z961 Presence of intraocular lens: Secondary | ICD-10-CM | POA: Diagnosis present

## 2018-04-20 DIAGNOSIS — R7881 Bacteremia: Secondary | ICD-10-CM | POA: Diagnosis not present

## 2018-04-20 DIAGNOSIS — R300 Dysuria: Secondary | ICD-10-CM | POA: Diagnosis not present

## 2018-04-20 DIAGNOSIS — S90851S Superficial foreign body, right foot, sequela: Secondary | ICD-10-CM | POA: Diagnosis not present

## 2018-04-20 DIAGNOSIS — Z95828 Presence of other vascular implants and grafts: Secondary | ICD-10-CM | POA: Diagnosis not present

## 2018-04-20 DIAGNOSIS — R2681 Unsteadiness on feet: Secondary | ICD-10-CM | POA: Diagnosis not present

## 2018-04-20 DIAGNOSIS — R21 Rash and other nonspecific skin eruption: Secondary | ICD-10-CM | POA: Diagnosis present

## 2018-04-20 DIAGNOSIS — E876 Hypokalemia: Secondary | ICD-10-CM | POA: Diagnosis present

## 2018-04-20 DIAGNOSIS — Z9079 Acquired absence of other genital organ(s): Secondary | ICD-10-CM

## 2018-04-20 DIAGNOSIS — I330 Acute and subacute infective endocarditis: Secondary | ICD-10-CM | POA: Diagnosis not present

## 2018-04-20 DIAGNOSIS — R2243 Localized swelling, mass and lump, lower limb, bilateral: Secondary | ICD-10-CM | POA: Diagnosis not present

## 2018-04-20 LAB — COMPREHENSIVE METABOLIC PANEL
ALBUMIN: 3.5 g/dL (ref 3.5–5.0)
ALT: 18 U/L (ref 0–44)
AST: 18 U/L (ref 15–41)
Alkaline Phosphatase: 115 U/L (ref 38–126)
Anion gap: 12 (ref 5–15)
BUN: 13 mg/dL (ref 8–23)
CALCIUM: 8.9 mg/dL (ref 8.9–10.3)
CO2: 24 mmol/L (ref 22–32)
Chloride: 96 mmol/L — ABNORMAL LOW (ref 98–111)
Creatinine, Ser: 0.91 mg/dL (ref 0.44–1.00)
GFR calc Af Amer: >60 mL/min (ref >60)
GFR calc non Af Amer: >60 mL/min (ref >60)
Glucose, Bld: 420 mg/dL — ABNORMAL HIGH (ref 70–99)
Potassium: 2.8 mmol/L — ABNORMAL LOW (ref 3.5–5.1)
Sodium: 132 mmol/L — ABNORMAL LOW (ref 135–145)
Total Bilirubin: 0.9 mg/dL (ref 0.3–1.2)
Total Protein: 8.4 g/dL — ABNORMAL HIGH (ref 6.5–8.1)

## 2018-04-20 LAB — CBC WITH DIFFERENTIAL/PLATELET
Abs Immature Granulocytes: 0.08 10*3/uL — ABNORMAL HIGH (ref 0.00–0.07)
Basophils Absolute: 0.1 10*3/uL (ref 0.0–0.1)
Basophils Relative: 0 %
Eosinophils Absolute: 0.1 10*3/uL (ref 0.0–0.5)
Eosinophils Relative: 0 %
HCT: 42.7 % (ref 36.0–46.0)
Hemoglobin: 15.2 g/dL — ABNORMAL HIGH (ref 12.0–15.0)
IMMATURE GRANULOCYTES: 1 %
Lymphocytes Relative: 11 %
Lymphs Abs: 1.7 10*3/uL (ref 0.7–4.0)
MCH: 27.8 pg (ref 26.0–34.0)
MCHC: 35.6 g/dL (ref 30.0–36.0)
MCV: 78.1 fL — ABNORMAL LOW (ref 80.0–100.0)
Monocytes Absolute: 1.4 10*3/uL — ABNORMAL HIGH (ref 0.1–1.0)
Monocytes Relative: 9 %
NEUTROS PCT: 79 %
Neutro Abs: 12.4 10*3/uL — ABNORMAL HIGH (ref 1.7–7.7)
Platelets: 377 10*3/uL (ref 150–400)
RBC: 5.47 MIL/uL — ABNORMAL HIGH (ref 3.87–5.11)
RDW: 13.2 % (ref 11.5–15.5)
WBC: 15.7 10*3/uL — ABNORMAL HIGH (ref 4.0–10.5)
nRBC: 0 % (ref 0.0–0.2)

## 2018-04-20 LAB — SEDIMENTATION RATE: Sed Rate: 56 mm/hr — ABNORMAL HIGH (ref 0–22)

## 2018-04-20 LAB — CBG MONITORING, ED: Glucose-Capillary: 411 mg/dL — ABNORMAL HIGH (ref 70–99)

## 2018-04-20 LAB — LACTIC ACID, PLASMA
Lactic Acid, Venous: 2.4 mmol/L (ref 0.5–1.9)
Lactic Acid, Venous: 1.1 mmol/L (ref 0.5–1.9)

## 2018-04-20 MED ORDER — SODIUM CHLORIDE 0.9 % IV SOLN: INTRAVENOUS | Status: DC

## 2018-04-20 MED ORDER — INSULIN GLARGINE 100 UNIT/ML ~~LOC~~ SOLN
15.0000 [IU] | Freq: Every day | SUBCUTANEOUS | Status: DC
  Administered 2018-04-21 (×2): 15 [IU] via SUBCUTANEOUS
  Filled 2018-04-20 (×4): qty 0.15

## 2018-04-20 MED ORDER — VANCOMYCIN HCL 10 G IV SOLR
1500.0000 mg | Freq: Once | INTRAVENOUS | Status: AC
  Administered 2018-04-20: 1500 mg via INTRAVENOUS
  Filled 2018-04-20: qty 1500

## 2018-04-20 MED ORDER — INSULIN ASPART 100 UNIT/ML ~~LOC~~ SOLN
9.0000 [IU] | Freq: Once | SUBCUTANEOUS | Status: AC
  Administered 2018-04-20: 9 [IU] via INTRAVENOUS
  Filled 2018-04-20: qty 1

## 2018-04-20 MED ORDER — MORPHINE SULFATE (PF) 2 MG/ML IV SOLN
2.0000 mg | INTRAVENOUS | Status: DC | PRN
  Administered 2018-04-21 – 2018-04-24 (×4): 2 mg via INTRAVENOUS
  Filled 2018-04-20 (×4): qty 1

## 2018-04-20 MED ORDER — INSULIN ASPART 100 UNIT/ML ~~LOC~~ SOLN
0.0000 [IU] | Freq: Three times a day (TID) | SUBCUTANEOUS | Status: DC
  Administered 2018-04-21: 3 [IU] via SUBCUTANEOUS
  Administered 2018-04-21: 5 [IU] via SUBCUTANEOUS
  Administered 2018-04-21: 3 [IU] via SUBCUTANEOUS
  Administered 2018-04-22 (×2): 5 [IU] via SUBCUTANEOUS

## 2018-04-20 MED ORDER — SODIUM CHLORIDE 0.9 % IV SOLN: 2.0000 g | INTRAVENOUS | Status: DC

## 2018-04-20 MED ORDER — SODIUM CHLORIDE 0.9 % IV SOLN
INTRAVENOUS | Status: AC
  Administered 2018-04-20: 22:00:00 via INTRAVENOUS

## 2018-04-20 MED ORDER — SODIUM CHLORIDE 0.9 % IV SOLN
2.0000 g | INTRAVENOUS | Status: DC
  Administered 2018-04-21: 2 g via INTRAVENOUS
  Filled 2018-04-20: qty 2
  Filled 2018-04-20: qty 20

## 2018-04-20 MED ORDER — SODIUM CHLORIDE 0.9 % IV BOLUS
1000.0000 mL | Freq: Once | INTRAVENOUS | Status: AC
  Administered 2018-04-20: 1000 mL via INTRAVENOUS

## 2018-04-20 MED ORDER — POTASSIUM CHLORIDE CRYS ER 20 MEQ PO TBCR
40.0000 meq | EXTENDED_RELEASE_TABLET | Freq: Once | ORAL | Status: AC
  Administered 2018-04-20: 40 meq via ORAL
  Filled 2018-04-20: qty 2

## 2018-04-20 MED ORDER — SODIUM CHLORIDE 0.9 % IV BOLUS: 500.0000 mL | Freq: Once | INTRAVENOUS | Status: DC

## 2018-04-20 MED ORDER — ENOXAPARIN SODIUM 40 MG/0.4ML ~~LOC~~ SOLN: 40.0000 mg | SUBCUTANEOUS | Status: DC

## 2018-04-20 MED ORDER — SODIUM CHLORIDE 0.9% FLUSH: 3.0000 mL | Freq: Once | INTRAVENOUS | Status: DC

## 2018-04-20 MED ORDER — PIPERACILLIN-TAZOBACTAM 3.375 G IVPB 30 MIN
3.3750 g | Freq: Once | INTRAVENOUS | Status: AC
  Administered 2018-04-20: 3.375 g via INTRAVENOUS
  Filled 2018-04-20: qty 50

## 2018-04-20 MED ORDER — ACETAMINOPHEN 325 MG PO TABS
650.0000 mg | ORAL_TABLET | Freq: Four times a day (QID) | ORAL | Status: DC | PRN
  Administered 2018-04-21 (×2): 650 mg via ORAL
  Filled 2018-04-20 (×2): qty 2

## 2018-04-20 MED ORDER — MORPHINE SULFATE (PF) 4 MG/ML IV SOLN
6.0000 mg | Freq: Once | INTRAVENOUS | Status: AC
  Administered 2018-04-20: 6 mg via INTRAVENOUS
  Filled 2018-04-20: qty 2

## 2018-04-20 MED ORDER — LIDOCAINE-EPINEPHRINE (PF) 2 %-1:200000 IJ SOLN
20.0000 mL | Freq: Once | INTRAMUSCULAR | Status: AC
  Administered 2018-04-20: 20 mL via INTRADERMAL
  Filled 2018-04-20: qty 20

## 2018-04-20 MED ORDER — SENNOSIDES-DOCUSATE SODIUM 8.6-50 MG PO TABS
1.0000 | ORAL_TABLET | Freq: Every day | ORAL | Status: DC
  Administered 2018-04-21 – 2018-04-25 (×3): 1 via ORAL
  Filled 2018-04-20 (×4): qty 1

## 2018-04-20 MED ORDER — SODIUM CHLORIDE 0.9 % IV BOLUS
500.0000 mL | Freq: Once | INTRAVENOUS | Status: AC
  Administered 2018-04-20: 500 mL via INTRAVENOUS

## 2018-04-20 NOTE — ED Notes (Signed)
Bed: TX52 Expected date:  Expected time:  Means of arrival:  Comments: Glass in foot

## 2018-04-20 NOTE — ED Triage Notes (Signed)
Patient arrived via [facility]. Patient is AOx4, from home and ambulatory at baseline however not currently due to Right foot / ankle injury/swelling. Patient injured foot 2 weeks ago by stepping on glass. Patient Right foot is warm to touch and painful / red. Patient has no other complaints.

## 2018-04-20 NOTE — ED Notes (Signed)
ED TO INPATIENT HANDOFF REPORT  ED Nurse Name and Phone #: Precious Bard RN [phone]  S Name/Age/Gender [name] 78 y.o. female Room/Bed: WA19/WA19  Code Status   Code Status: Full Code  Home/SNF/Other Home Patient oriented to: self, place, time and situation Is this baseline? Yes   Triage Complete: Triage complete  Chief Complaint Foot Injury  Triage Note Patient arrived via [facility]. Patient is AOx4, from home and ambulatory at baseline however not currently due to Right foot / ankle injury/swelling. Patient injured foot 2 weeks ago by stepping on glass. Patient Right foot is warm to touch and painful / red. Patient has no other complaints.   Allergies No Known Allergies  Level of Care/Admitting Diagnosis ED Disposition    ED Disposition Condition Comment   Admit  Hospital Area: [facility] [[id number]]  Level of Care: Med-Surg [16]  Diagnosis: Diabetic foot infection [facility]) [[id number]]  Admitting Physician: Colbert Ewing [[id number]]  Attending Physician: Colbert Ewing [[id number]]  Estimated length of stay: past midnight tomorrow  Certification:: I certify this patient will need inpatient services for at least 2 midnights  PT Class (Do Not Modify): Inpatient [101]  PT Acc Code (Do Not Modify): Private [1]       B Medical/Surgery History Past Medical History:  Diagnosis Date  . Abdominal distension   . Anemia   . Anxiety   . B12 deficiency   . Balance problem   . Blood in stool   . Cancer (HCC)    OVARIAN  . Colon polyps   . Dementia ([place])    EARLY  . Dementia ([place])    EARLY  . Depression   . GERD (gastroesophageal reflux disease)   . History of blood transfusion   . Hypertension   . Hypomagnesemia   . Urine incontinence    Past Surgical History:  Procedure Laterality Date  . ABDOMINAL HYSTERECTOMY    . CATARACT EXTRACTION W/PHACO Right 07/26/2015   Procedure: CATARACT EXTRACTION PHACO AND INTRAOCULAR LENS PLACEMENT (IOC);   Surgeon: Birder Robson, MD;  Location: [facility] ORS;  Service: Ophthalmology;  Laterality: Right;  [name]   1:03.5 AP%  23% CDE   14.59 fluid pack lot # [id number]  . CATARACT EXTRACTION W/PHACO Left 08/29/2015   Procedure: CATARACT EXTRACTION PHACO AND INTRAOCULAR LENS PLACEMENT ([place]);  Surgeon: Birder Robson, MD;  Location: [facility] ORS;  Service: Ophthalmology;  Laterality: Left;  [name] 00:40 AP% 20.9 CDE 8.50 fluid pack lot # [id number]  . COLONOSCOPY WITH PROPOFOL N/A 03/14/2015   Procedure: COLONOSCOPY WITH PROPOFOL;  Surgeon: Hulen Luster, MD;  [facility] ENDOSCOPY;  Service: Gastroenterology;  Laterality: N/A;  . ESOPHAGOGASTRODUODENOSCOPY (EGD) WITH PROPOFOL N/A 03/14/2015   Procedure: ESOPHAGOGASTRODUODENOSCOPY (EGD) WITH PROPOFOL;  Surgeon: Hulen Luster, MD;  Location: [facility] ENDOSCOPY;  Service: Gastroenterology;  Laterality: N/A;  . none    . TOTAL ABDOMINAL HYSTERECTOMY W/ BILATERAL SALPINGOOPHORECTOMY  04/19/2015   exploratory laparoscopy, total abdominal hysterectomy, bilateral salping oophorectomy, left pelvic lymph node dissection, omentectomy     A IV Location/Drains/Wounds Patient Lines/Drains/Airways Status   Active Line/Drains/Airways    Name:   Placement date:   Placement time:   Site:   Days:   Peripheral IV 04/20/18 Left;Upper Forearm   04/20/18    1734    Forearm   less than 1          Intake/Output Last 24 hours  Intake/Output Summary (Last 24 hours) at 04/20/2018 2056 Last data filed at 04/20/2018  2031 Gross per 24 hour  Intake 1100 ml  Output -  Net 1100 ml    Labs/Imaging Results for orders placed or performed during the hospital encounter of 04/20/18 (from the past 48 hour(s))  CBG monitoring, ED     Status: Abnormal   Collection Time: 04/20/18  5:02 PM  Result Value Ref Range   Glucose-Capillary 411 (H) 70 - 99 mg/dL  Comprehensive metabolic panel     Status: Abnormal   Collection Time: 04/20/18  5:17 PM  Result Value Ref Range   Sodium 132 (L) 135 - 145  mmol/L   Potassium 2.8 (L) 3.5 - 5.1 mmol/L   Chloride 96 (L) 98 - 111 mmol/L   CO2 24 22 - 32 mmol/L   Glucose, Bld 420 (H) 70 - 99 mg/dL   BUN 13 8 - 23 mg/dL   Creatinine, Ser 0.91 0.44 - 1.00 mg/dL   Calcium 8.9 8.9 - 10.3 mg/dL   Total Protein 8.4 (H) 6.5 - 8.1 g/dL   Albumin 3.5 3.5 - 5.0 g/dL   AST 18 15 - 41 U/L   ALT 18 0 - 44 U/L   Alkaline Phosphatase 115 38 - 126 U/L   Total Bilirubin 0.9 0.3 - 1.2 mg/dL   GFR calc non Af Amer >60 >60 mL/min   GFR calc Af Amer >60 >60 mL/min   Anion gap 12 5 - 15    Comment: Performed at [facility], [place] [address], [place], [place] [address]  CBC with Differential     Status: Abnormal   Collection Time: 04/20/18  5:17 PM  Result Value Ref Range   WBC 15.7 (H) 4.0 - 10.5 K/uL   RBC 5.47 (H) 3.87 - 5.11 MIL/uL   Hemoglobin 15.2 (H) 12.0 - 15.0 g/dL   HCT 42.7 36.0 - 46.0 %   MCV 78.1 (L) 80.0 - 100.0 fL   MCH 27.8 26.0 - 34.0 pg   MCHC 35.6 30.0 - 36.0 g/dL   RDW 13.2 11.5 - 15.5 %   Platelets 377 150 - 400 K/uL   nRBC 0.0 0.0 - 0.2 %   Neutrophils Relative % 79 %   Neutro Abs 12.4 (H) 1.7 - 7.7 K/uL   Lymphocytes Relative 11 %   Lymphs Abs 1.7 0.7 - 4.0 K/uL   Monocytes Relative 9 %   Monocytes Absolute 1.4 (H) 0.1 - 1.0 K/uL   Eosinophils Relative 0 %   Eosinophils Absolute 0.1 0.0 - 0.5 K/uL   Basophils Relative 0 %   Basophils Absolute 0.1 0.0 - 0.1 K/uL   Immature Granulocytes 1 %   Abs Immature Granulocytes 0.08 (H) 0.00 - 0.07 K/uL    Comment: Performed at [facility], [place] [address], [place], [place] [address]   No results found.  Pending Labs Unresulted Labs (From admission, onward)    Start     Ordered   04/21/18 0500  CBC with Differential/Platelet  Daily,   R     04/20/18 2024   04/21/18 3474  Basic metabolic panel  Daily,   R     04/20/18 2024   04/21/18 0500  Magnesium  Daily,   R     04/20/18 2024   04/20/18 2025  Sedimentation rate  Add-on,   R     04/20/18  2024   04/20/18 1950  Wound or Superficial Culture  Once,   STAT    Question:  Patient immune status  Answer:  Immunocompromised  04/20/18 1949   04/20/18 1949  Blood culture (routine x 2)  BLOOD CULTURE X 2,   STAT     04/20/18 1949   04/20/18 1949  Lactic acid, plasma  Now then every 2 hours,   STAT     04/20/18 1949   04/20/18 1820  Urinalysis, Routine w reflex microscopic  Once,   R     04/20/18 1819          Vitals/Pain Today's Vitals   04/20/18 1944 04/20/18 1945 04/20/18 2030 04/20/18 2031  BP:   120/60 120/60  Pulse:  (!) 102  99  Resp:    15  Temp:    98.2 F (36.8 C)  TempSrc:      SpO2:  96%  98%  Weight:      Height:      PainSc: 2        Isolation Precautions No active isolations  Medications Medications  vancomycin (VANCOCIN) 1,500 mg in sodium chloride 0.9 % 500 mL IVPB (1,500 mg Intravenous New Bag/Given 04/20/18 2031)  enoxaparin (LOVENOX) injection 40 mg (has no administration in time range)  insulin glargine (LANTUS) injection 15 Units (has no administration in time range)  insulin aspart (novoLOG) injection 0-15 Units (has no administration in time range)  cefTRIAXone (ROCEPHIN) 2 g in sodium chloride 0.9 % 100 mL IVPB (has no administration in time range)  0.9 %  sodium chloride infusion (has no administration in time range)  acetaminophen (TYLENOL) tablet 650 mg (has no administration in time range)  morphine 2 MG/ML injection 2 mg (has no administration in time range)  senna-docusate (Senokot-S) tablet 1 tablet (has no administration in time range)  lidocaine-EPINEPHrine (XYLOCAINE W/EPI) 2 %-1:200000 (PF) injection 20 mL (20 mLs Intradermal Given by Other 04/20/18 1821)  morphine 4 MG/ML injection 6 mg (6 mg Intravenous Given 04/20/18 1902)  potassium chloride SA (K-DUR,KLOR-CON) CR tablet 40 mEq (40 mEq Oral Given 04/20/18 1901)  sodium chloride 0.9 % bolus 1,000 mL (0 mLs Intravenous Stopped 04/20/18 2024)  insulin aspart (novoLOG) injection 9  Units (9 Units Intravenous Given 04/20/18 1901)  piperacillin-tazobactam (ZOSYN) IVPB 3.375 g (0 g Intravenous Stopped 04/20/18 2031)  sodium chloride 0.9 % bolus 500 mL (500 mLs Intravenous New Bag/Given 04/20/18 2035)    Mobility walks with device   Focused Assessments Neuro Assessment Handoff:  Swallow screen pass? Yes  Cardiac Rhythm: Sinus tachycardia       Neuro Assessment:   Neuro Checks:      Last Documented NIHSS Modified Score:   Has TPA been given? No If patient is a Neuro Trauma and patient is going to OR before floor call report to [place] nurse: [phone] or [phone]     R Recommendations: See Admitting Provider Note  Report given to:   Additional Notes: R foot wound

## 2018-04-20 NOTE — ED Provider Notes (Signed)
[facility] DEPT Provider Note   CSN: [id number] Arrival date & time: 04/20/18  1649    History   Chief Complaint Chief Complaint  Patient presents with  . Right Foot / Ankle Pain    HPI [name] is a 78 y.o. female who presents with right foot and ankle pain.  Past medical history significant for dementia, ovarian cancer, hypertension, diabetes.  The patient is a poor historian due to dementia.  She states that she stepped on a piece of glass about 2 weeks ago and since then she has had gradually worsening swelling and pain in the right foot and ankle.  She went to her doctor on March 19 and it showed a possible 63mm foreign body and she was prescribed clindamycin.  She states she has not been taking this because insurance would not cover it.  She went to podiatry that same day.  The podiatrist attempted to perform I&D on the area however the patient decided to leave the office without this being done.  Today the patient got an argument with her grandson who is staying with her at the apartment.  It is unclear who called EMS to bring the patient here to the emergency department.  The patient denies any fever, chest pain, shortness of breath, abdominal pain, nausea or vomiting. The patient is unaware of any medical problems that she has such as diabetes and states she's not on any medicines.  The patient's daughter-in-law subsequently came into the room and outside the room we discussed that the patient was previously in a long-term care facility in [place] before she moved to [place] about 3 years ago.  She has been declining and having more more difficulty taking care of herself.  Family members have been coming into the apartment and trying to clean.  They have been noting there is feces and urine on the floor.  They have been trying to bring her food as well.  Now that the patient's grandson is not living in the apartment anymore they do not want  the patient return home due to safety concerns.  [place] (daughter in law)   HPI  Past Medical History:  Diagnosis Date  . Abdominal distension   . Anemia   . Anxiety   . B12 deficiency   . Balance problem   . Blood in stool   . Cancer (HCC)    OVARIAN  . Colon polyps   . Dementia ([place])    EARLY  . Dementia ([place])    EARLY  . Depression   . GERD (gastroesophageal reflux disease)   . History of blood transfusion   . Hypertension   . Hypomagnesemia   . Urine incontinence     Patient Active Problem List   Diagnosis Date Noted  . Iron deficiency 04/18/2018  . Foreign body in foot, left, initial encounter 04/18/2018  . Cellulitis of right leg 04/18/2018  . Type 2 diabetes mellitus with complication, with long-term current use of insulin ([place]) 04/12/2016  . Vitamin D deficiency 03/06/2016  . 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 Essential hypertension 03/21/2015  . Vision decreased 03/21/2015  . Tension-type headache, not intractable 03/21/2015  . Anemia due to chronic blood loss 03/16/2015  . Morbid obesity ([place]) 02/02/2015  . Anemia 01/30/2015    Past Surgical History:  Procedure Laterality Date  . ABDOMINAL HYSTERECTOMY    . CATARACT EXTRACTION W/PHACO Right 07/26/2015   Procedure: CATARACT EXTRACTION PHACO AND INTRAOCULAR LENS  PLACEMENT (IOC);  Surgeon: Birder Robson, MD;  Location: [facility] ORS;  Service: Ophthalmology;  Laterality: Right;  [name]   1:03.5 AP%  23% CDE   14.59 fluid pack lot # [id number]  . CATARACT EXTRACTION W/PHACO Left 08/29/2015   Procedure: CATARACT EXTRACTION PHACO AND INTRAOCULAR LENS PLACEMENT ([place]);  Surgeon: Birder Robson, MD;  Location: [facility] ORS;  Service: Ophthalmology;  Laterality: Left;  [name] 00:40 AP% 20.9 CDE 8.50 fluid pack lot # [id number]  . COLONOSCOPY WITH PROPOFOL N/A 03/14/2015   Procedure: COLONOSCOPY WITH PROPOFOL;  Surgeon: Hulen Luster, MD;  Location: [facility] ENDOSCOPY;  Service: Gastroenterology;  Laterality:  N/A;  . ESOPHAGOGASTRODUODENOSCOPY (EGD) WITH PROPOFOL N/A 03/14/2015   Procedure: ESOPHAGOGASTRODUODENOSCOPY (EGD) WITH PROPOFOL;  Surgeon: Hulen Luster, MD;  Location: [facility] ENDOSCOPY;  Service: Gastroenterology;  Laterality: N/A;  . none    . TOTAL ABDOMINAL HYSTERECTOMY W/ BILATERAL SALPINGOOPHORECTOMY  04/19/2015   exploratory laparoscopy, total abdominal hysterectomy, bilateral salping oophorectomy, left pelvic lymph node dissection, omentectomy     OB History   No obstetric history on file.      Home Medications    Prior to Admission medications   Medication Sig Start Date End Date Taking? Authorizing Provider  calcium carbonate (TUMS - DOSED IN MG ELEMENTAL CALCIUM) 500 MG chewable tablet Chew 1 tablet by mouth every 6 (six) hours as needed for indigestion or heartburn.    [provider]  cholecalciferol (VITAMIN D) 400 units TABS tablet Take 1,000 Units by mouth daily.    [provider]  citalopram (CELEXA) 10 MG tablet Take 10 mg by mouth daily.    [provider]  clindamycin (CLEOCIN) 300 MG capsule Take 1 capsule (300 mg total) by mouth 3 (three) times daily. 04/17/18   [name], [name], NP  Continuous Blood Gluc Receiver (FREESTYLE LIBRE READER) DEVI 1 Units by Does not apply route every 14 (fourteen) days. 04/17/18   [name], [name], NP  Continuous Blood Gluc Sensor (FREESTYLE LIBRE 14 DAY SENSOR) MISC 1 Units by Does not apply route every 14 (fourteen) days. 04/17/18   [name], [name], NP  fenofibrate (TRICOR) 145 MG tablet Take 1 tablet (145 mg total) by mouth daily. 04/12/16   [name], [name], NP  ibuprofen (ADVIL,MOTRIN) 600 MG tablet Take 600 mg by mouth every 6 (six) hours as needed.    [provider]  insulin glargine (LANTUS) 100 unit/mL SOPN Inject 0.1 mLs (10 Units total) into the skin at bedtime. 04/17/18   [name], [name], NP  insulin lispro protamine-lispro (HUMALOG 75/25 MIX) (75-25) 100 UNIT/ML SUSP injection  Inject 10 Units into the skin 2 (two) times daily with a meal. 04/18/18   [name], [name], NP  Insulin Pen Needle (PEN NEEDLES) 31G X 8 MM MISC 1 application by Does not apply route at bedtime. 04/17/18   [name], [name], NP  Insulin Syringe-Needle U-100 (INSULIN SYRINGE .5CC/30GX1/2") 30G X 1/2" 0.5 ML MISC 1 Units by Does not apply route 2 (two) times daily after a meal. 04/18/18   [name], [name], NP  lisinopril (PRINIVIL,ZESTRIL) 5 MG tablet Take 1 tablet (5 mg total) by mouth daily. 04/18/18   [name], [name], NP  ondansetron (ZOFRAN) 4 MG tablet Take 4 mg by mouth every 8 (eight) hours as needed for nausea or vomiting.    [provider]  SitaGLIPtin-MetFORMIN HCl 50-1000 MG TB24 Take 1 tablet by mouth daily after lunch. 04/12/16   [name], [name], NP  Family History Family History  Family history unknown: Yes    Social History Social History   Tobacco Use  . Smoking status: Never Smoker  . Smokeless tobacco: Never Used  Substance Use Topics  . Alcohol use: No  . Drug use: No     Allergies   Patient has no known allergies.   Review of Systems Review of Systems  Constitutional: Negative for fever.  Respiratory: Negative for cough and shortness of breath.   Cardiovascular: Negative for chest pain.  Gastrointestinal: Negative for abdominal pain, nausea and vomiting.  Genitourinary: Negative for dysuria.  Musculoskeletal: Positive for arthralgias and joint swelling.  Skin: Positive for wound.  Allergic/Immunologic: Positive for immunocompromised state.  All other systems reviewed and are negative.    Physical Exam Updated Vital Signs BP (!) 170/85   Pulse (!) 118   Temp 98.2 F (36.8 C) (Oral)   Resp 18   Ht 5' 4.5" (1.638 m)   Wt 92 kg   SpO2 98%   BMI 34.28 kg/m   Physical Exam Vitals signs and nursing note reviewed.  Constitutional:      General: She is not in acute distress.    Appearance: Normal appearance. She is  well-developed. She is not ill-appearing.     Comments: Elderly female in no acute distress.  Mildly confused. High pitched voice.  HENT:     Head: Normocephalic and atraumatic.  Eyes:     General: No scleral icterus.       Right eye: No discharge.        Left eye: No discharge.     Conjunctiva/sclera: Conjunctivae normal.     Pupils: Pupils are equal, round, and reactive to light.  Neck:     Musculoskeletal: Normal range of motion.  Cardiovascular:     Rate and Rhythm: Normal rate and regular rhythm.  Pulmonary:     Effort: Pulmonary effort is normal. No respiratory distress.     Breath sounds: Normal breath sounds.  Abdominal:     General: There is no distension.     Palpations: Abdomen is soft.     Tenderness: There is no abdominal tenderness.     Comments: Large well-healed midline scar  Genitourinary:    Comments: Malodorous fishy rash with macerated skin in groin Musculoskeletal:     Comments: Right ankle: Fluctuant area over the right lateral foot with surrounding edema and erythema extending up the R leg. Abscess is ~7cm x 3cm. No visible foreign body.  2+ DP pulse  Feces noted on the L foot  Skin:    General: Skin is warm and dry.  Neurological:     Mental Status: She is alert and oriented to person, place, and time.  Psychiatric:        Behavior: Behavior normal.      ED Treatments / Results  Labs (all labs ordered are listed, but only abnormal results are displayed) Labs Reviewed  COMPREHENSIVE METABOLIC PANEL - Abnormal; Notable for the following components:      Result Value   Sodium 132 (*)    Potassium 2.8 (*)    Chloride 96 (*)    Glucose, Bld 420 (*)    Total Protein 8.4 (*)    All other components within normal limits  CBC WITH DIFFERENTIAL/PLATELET - Abnormal; Notable for the following components:   WBC 15.7 (*)    RBC 5.47 (*)    Hemoglobin 15.2 (*)    MCV 78.1 (*)    Neutro Abs  12.4 (*)    Monocytes Absolute 1.4 (*)    Abs Immature  Granulocytes 0.08 (*)    All other components within normal limits  CBG MONITORING, ED - Abnormal; Notable for the following components:   Glucose-Capillary 411 (*)    All other components within normal limits  CULTURE, BLOOD (ROUTINE X 2)  CULTURE, BLOOD (ROUTINE X 2)  AEROBIC CULTURE (SUPERFICIAL SPECIMEN)  URINALYSIS, ROUTINE W REFLEX MICROSCOPIC  LACTIC ACID, PLASMA  LACTIC ACID, PLASMA    EKG None  Radiology No results found.  Procedures .Marland KitchenIncision and Drainage Date/Time: 04/20/2018 8:11 PM Performed by: Recardo Evangelist, PA-C Authorized by: Recardo Evangelist, PA-C   Consent:    Consent obtained:  Verbal   Consent given by:  Patient   Risks discussed:  Bleeding, incomplete drainage, pain and damage to other organs   Alternatives discussed:  No treatment Universal protocol:    Procedure explained and questions answered to patient or proxy's satisfaction: yes     Relevant documents present and verified: yes     Test results available and properly labeled: yes     Imaging studies available: yes     Required blood products, implants, devices, and special equipment available: yes     Site/side marked: yes     Immediately prior to procedure a time out was called: yes     Patient identity confirmed:  Verbally with patient Location:    Type:  Abscess   Size:  7x3cm   Location:  Lower extremity   Lower extremity location:  Foot   Foot location:  R foot Pre-procedure details:    Skin preparation:  Betadine Anesthesia (see MAR for exact dosages):    Anesthesia method:  Local infiltration   Local anesthetic:  Lidocaine 2% WITH epi Procedure type:    Complexity:  Simple Procedure details:    Incision types:  Single straight   Incision depth:  Dermal   Scalpel blade:  11   Wound management:  Probed and deloculated, irrigated with saline and extensive cleaning   Drainage:  Purulent   Drainage amount:  Copious   Wound treatment:  Wound left open   Packing  materials:  None Post-procedure details:    Patient tolerance of procedure:  Tolerated with difficulty   (including critical care time)     Medications Ordered in ED Medications  piperacillin-tazobactam (ZOSYN) IVPB 3.375 g (has no administration in time range)  lidocaine-EPINEPHrine (XYLOCAINE W/EPI) 2 %-1:200000 (PF) injection 20 mL (20 mLs Intradermal Given by Other 04/20/18 1821)  morphine 4 MG/ML injection 6 mg (6 mg Intravenous Given 04/20/18 1902)  potassium chloride SA (K-DUR,KLOR-CON) CR tablet 40 mEq (40 mEq Oral Given 04/20/18 1901)  sodium chloride 0.9 % bolus 1,000 mL (1,000 mLs Intravenous New Bag/Given 04/20/18 1901)  insulin aspart (novoLOG) injection 9 Units (9 Units Intravenous Given 04/20/18 1901)     Initial Impression / Assessment and Plan / ED Course  I have reviewed the triage vital signs and the nursing notes.  Pertinent labs & imaging results that were available during my care of the patient were reviewed by me and considered in my medical decision making (see chart for details).  78 year old female presents with right foot and ankle pain which is been gradually worsening over the past week after she reportedly stepped on a piece of glass.  She is mildly hypertensive and tachycardic here.  Other vital signs are normal.  On exam her foot appears acutely infected and swollen, red, tender.  She has a large abscess that extends from the lateral aspect to the plantar aspect of the foot.  An incision and drainage was performed and copious purulent drainage was expressed however the patient did not tolerate very well even after pain medicine and lidocaine.  CBC is remarkable for leukocytosis of 15.7.  This is increased from several days ago.  CMP is remarkable for hypokalemia (2.8), hyperglycemia (421).  Potassium was replaced and she was given fluids and insulin.  She had visit with Dr. Eulis Foster.  Due to uncontrolled and untreated diabetes, poor social situation, extensive  erythema and need for wound care, will admit.  Spoke with Dr. Hampton Abbot with [facility] who will come to see.  Final Clinical Impressions(s) / ED Diagnoses   Final diagnoses:  Abscess of foot  Uncontrolled other specified diabetes mellitus with hyperglycemia [facility])  Hypokalemia    ED Discharge Orders    None       Iris Pert 04/20/18 2016    Daleen Bo, MD 04/24/18 [phone]

## 2018-04-20 NOTE — ED Provider Notes (Signed)
   Face-to-face evaluation   History: She presents for evaluation of pain and swelling of the right foot, following stepping on some glass a couple weeks ago.  She states she is seen her PCP and a podiatrist.  The podiatrist tried to aspirate the area of her right lateral foot but only was able to return blood.  She denies fever, chills, cough, shortness of breath, weakness or dizziness.  Physical exam: Obese, alert elderly female.  Right foot tender and swollen laterally, with mild associated erythema.  No proximal streaking.  Medical screening examination/treatment/procedure(s) were conducted as a shared visit with non-physician practitioner(s) and myself.  I personally evaluated the patient during the encounter     Daleen Bo, MD 04/24/18 [phone]

## 2018-04-20 NOTE — Progress Notes (Signed)
  Pharmacy Antibiotic Note  [name] is a 78 y.o. female admitted on 04/20/2018 with R foot/ankle pain and swelling after stepping on glass 2 weeks ago. She was given Vanc 1.5g and Zosyn 3.375g in the ED. Switched to Rocephin and pharmacy has been consulted for vancomycin dosing.  Plan: Tomorrow start Vancomycin 1250mg  IV q24h to target AUC 518. Measure Vanc peak and trough at steady state. Follow up renal function, culture results, and clinical course.   Height: 5' 4.5" (163.8 cm) Weight: 202 lb 13.2 oz (92 kg) IBW/kg (Calculated) : 55.85  Temp (24hrs), Avg:98.1 F (36.7 C), Min:98 F (36.7 C), Max:98.2 F (36.8 C)  Recent Labs  Lab 04/17/18 1019 04/20/18 1717  WBC 12.6* 15.7*  CREATININE 0.88 0.91    Estimated Creatinine Clearance: 58.4 mL/min (by C-G formula based on SCr of 0.91 mg/dL).    No Known Allergies  Antimicrobials this admission: 3/22 Zosyn x 1  3/22 Vanc >> 3/22 CTX >>   Dose adjustments this admission:  --   Microbiology results: 3/22 foot wound: 3/22 BCx:  UCx:  Sputum:  MRSA PCR:   Thank you for allowing pharmacy to be a part of this patient's care.  Romeo Rabon, PharmD. Mobile: [phone]. 04/20/2018,8:41 PM.

## 2018-04-20 NOTE — Progress Notes (Signed)
A consult was received from an ED physician for vancomycin per pharmacy dosing.  The patient's profile has been reviewed for ht/wt/allergies/indication/available labs.   A one time order has been placed for vancomycin 1500mg  IV.  Further antibiotics/pharmacy consults should be ordered by admitting physician if indicated.                       Thank you, Romeo Rabon, PharmD. Mobile: [phone]. 04/20/2018,8:08 PM.

## 2018-04-20 NOTE — H&P (Signed)
History and Physical  [name] HKV:[id number] DOB: 12-15-1940 DOA: 04/20/2018 1656  Referring physician: Raquel James [facility] ED) PCP: Flossie Buffy, NP  Outpatient Specialists: [facility] (gyn oncology - Dr. Fransisca Connors)   HISTORY   Chief Complaint: R foot pain and swelling  HPI: [name] is a 78 y.o. 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 Patient reports that she accidentally stepped on broken glass about 2 weeks ago. Since then, noticed pain with weight bearing on the R foot and swelling of the foot and ankle. She was seen by outpatient podiatry on 3/19 where an XR showed soft tissue swelling and possible 60mm foreign object in the superficial R plantar soft tissue. Patient declined I&D at that time and was prescribed clindamycin, which patient has not filled. She also reports not having access to any medications including insulin for months due to "Medicare" issues. Per ED, patient's family expressed concerns regarding her safety at home and overall decline -- reporting feces and urine found on the floor in the house.  Denies fevers, chills, or sick contacts.   Review of Systems:  + R foot pain, swelling - no fevers/chills - no cough - no chest pain, dyspnea on exertion - no edema, PND, orthopnea - no nausea/vomiting; no tarry, melanotic or bloody stools - no dysuria, increased urinary frequency - no weight changes Rest of systems reviewed are negative, except as per above history.   ED course:  Vitals Blood pressure 120/60, pulse 99, temperature 98.2 F (36.8 C), resp. rate 15, height 5' 4.5" (1.638 m), weight 92 kg, SpO2 98 %. Received insulin aspart 9 units x 1; lidocaine subq (for bedside I&D in ED), morphine 6mg  x1; K 66mEq PO x 1; NS x 1L bolus. Underwent I&D of R lateral foot with drainage of  copious amount of purulent discharge.   Past Medical History:  Diagnosis Date  . Abdominal distension   . Anemia   . Anxiety   . B12 deficiency   . Balance problem   . Blood in stool   . Cancer (HCC)    OVARIAN  . Colon polyps   . Dementia ([place])    EARLY  . Dementia ([place])    EARLY  . Depression   . GERD (gastroesophageal reflux disease)   . History of blood transfusion   . Hypertension   . Hypomagnesemia   . Urine incontinence    Past Surgical History:  Procedure Laterality Date  . ABDOMINAL HYSTERECTOMY    . CATARACT EXTRACTION W/PHACO Right 07/26/2015   Procedure: CATARACT EXTRACTION PHACO AND INTRAOCULAR LENS PLACEMENT (IOC);  Surgeon: Birder Robson, MD;  Location: [facility] ORS;  Service: Ophthalmology;  Laterality: Right;  [name]   1:03.5 AP%  23% CDE   14.59 fluid pack lot # [id number]  . CATARACT EXTRACTION W/PHACO Left 08/29/2015   Procedure: CATARACT EXTRACTION PHACO AND INTRAOCULAR LENS PLACEMENT ([place]);  Surgeon: Birder Robson, MD;  Location: [facility] ORS;  Service: Ophthalmology;  Laterality: Left;  [name] 00:40 AP% 20.9 CDE 8.50 fluid pack lot # [id number]  . COLONOSCOPY WITH PROPOFOL N/A 03/14/2015   Procedure: COLONOSCOPY WITH PROPOFOL;  Surgeon: Hulen Luster, MD;  Location: [facility] ENDOSCOPY;  Service: Gastroenterology;  Laterality: N/A;  . ESOPHAGOGASTRODUODENOSCOPY (EGD) WITH PROPOFOL N/A 03/14/2015   Procedure: ESOPHAGOGASTRODUODENOSCOPY (EGD) WITH PROPOFOL;  Surgeon: Hulen Luster,  MD;  Location: [facility] ENDOSCOPY;  Service: Gastroenterology;  Laterality: N/A;  . none    . TOTAL ABDOMINAL HYSTERECTOMY W/ BILATERAL SALPINGOOPHORECTOMY  04/19/2015   exploratory laparoscopy, total abdominal hysterectomy, bilateral salping oophorectomy, left pelvic lymph node dissection, omentectomy    Social History:  reports that she has never smoked. She has never used smokeless tobacco. She reports that she does not drink alcohol or use drugs.  No Known Allergies  Family History  Family history  unknown: Yes      Prior to Admission medications   Medication Sig Start Date End Date Taking? Authorizing Provider  calcium carbonate (TUMS - DOSED IN MG ELEMENTAL CALCIUM) 500 MG chewable tablet Chew 1 tablet by mouth every 6 (six) hours as needed for indigestion or heartburn.    [provider]  cholecalciferol (VITAMIN D) 400 units TABS tablet Take 1,000 Units by mouth daily.    [provider]  citalopram (CELEXA) 10 MG tablet Take 10 mg by mouth daily.    [provider]  clindamycin (CLEOCIN) 300 MG capsule Take 1 capsule (300 mg total) by mouth 3 (three) times daily. 04/17/18   [name], [name], NP  Continuous Blood Gluc Receiver (FREESTYLE LIBRE READER) DEVI 1 Units by Does not apply route every 14 (fourteen) days. 04/17/18   [name], [name], NP  Continuous Blood Gluc Sensor (FREESTYLE LIBRE 14 DAY SENSOR) MISC 1 Units by Does not apply route every 14 (fourteen) days. 04/17/18   [name], [name], NP  fenofibrate (TRICOR) 145 MG tablet Take 1 tablet (145 mg total) by mouth daily. 04/12/16   [name], [name], NP  ibuprofen (ADVIL,MOTRIN) 600 MG tablet Take 600 mg by mouth every 6 (six) hours as needed.    [provider]  insulin glargine (LANTUS) 100 unit/mL SOPN Inject 0.1 mLs (10 Units total) into the skin at bedtime. 04/17/18   [name], [name], NP  insulin lispro protamine-lispro (HUMALOG 75/25 MIX) (75-25) 100 UNIT/ML SUSP injection Inject 10 Units into the skin 2 (two) times daily with a meal. 04/18/18   [name], [name], NP  Insulin Pen Needle (PEN NEEDLES) 31G X 8 MM MISC 1 application by Does not apply route at bedtime. 04/17/18   [name], [name], NP  Insulin Syringe-Needle U-100 (INSULIN SYRINGE .5CC/30GX1/2") 30G X 1/2" 0.5 ML MISC 1 Units by Does not apply route 2 (two) times daily after a meal. 04/18/18   [name], [name], NP  lisinopril (PRINIVIL,ZESTRIL) 5 MG tablet Take 1 tablet (5 mg total) by mouth daily. 04/18/18   [name],  [name], NP  ondansetron (ZOFRAN) 4 MG tablet Take 4 mg by mouth every 8 (eight) hours as needed for nausea or vomiting.    [provider]  SitaGLIPtin-MetFORMIN HCl 50-1000 MG TB24 Take 1 tablet by mouth daily after lunch. 04/12/16   [name], [name], NP    PHYSICAL EXAM   Temp:  [98 F (36.7 C)-98.2 F (36.8 C)] 98.2 F (36.8 C) (03/22 2031) Pulse Rate:  [99-117] 99 (03/22 2031) Cardiac Rhythm: Sinus tachycardia (03/22 1947) Resp:  [15-18] 15 (03/22 2031) BP: (91-170)/(60-89) 120/60 (03/22 2031) SpO2:  [92 %-99 %] 98 % (03/22 2031) Weight:  [92 kg] 92 kg (03/22 1703)  BP 120/60   Pulse 99   Temp 98.2 F (36.8 C)   Resp 15   Ht 5' 4.5" (1.638 m)   Wt 92 kg   SpO2 98%   BMI 34.28 kg/m    GEN obese elderly  hispanic female; resting in bed, comfortably, conversant  HEENT NCAT EOM intact PERRL; clear oropharynx, no cervical LAD; dry mucus membranes  JVP estimated 5 cm H2O above RA; no HJR ; no carotid bruits b/l ;  CV regular normal rate; normal S1 and S2; no m/r/g or S3/S4; no parasternal heave  RESP CTA b/l; breathing unlabored and symmetric  ABD soft NT ND +normoactive BS; well healed vertical abdominal surgical scar EXT warm throughout b/l; no pitting peripheral edema b/l; edema surrounding R ankle and foot  PULSES  DP and radials 2+ intact b/l SKIN/MSK  R lateral mid foot with purulent draining abscess extending from lateral edge to mid plantar surface; small incision on dorsal lateral foot draining yellowish-clear fluid (after recent I&D); edema over dorsum of R foot to lower ankle; mildly warm to touch; full ROM intact;  + feces streaked on L plantar foot NEURO/PSYCH AAOx3; no focal deficits Gross sensation intact for bilateral feet (plantar surfaces) Patient answers questions appropriately, although is not able to provide specific details; oriented to self, time, and place;    DATA   LABS ON ADMISSION:  Basic Metabolic Panel: Recent Labs  Lab  04/17/18 1019 04/20/18 1717  NA 136 132*  K 3.7 2.8*  CL 96 96*  CO2 28 24  GLUCOSE 341* 420*  BUN 8 13  CREATININE 0.88 0.91  CALCIUM 9.5 8.9   CBC: Recent Labs  Lab 04/17/18 1019 04/20/18 1717  WBC 12.6* 15.7*  NEUTROABS 10.3* 12.4*  HGB 15.5* 15.2*  HCT 45.5 42.7  MCV 82.7 78.1*  PLT 326.0 377   Liver Function Tests: Recent Labs  Lab 04/17/18 1019 04/20/18 1717  AST 10 18  ALT 11 18  ALKPHOS 91 115  BILITOT 1.3* 0.9  PROT 7.6 8.4*  ALBUMIN 4.0 3.5   No results for input(s): LIPASE, AMYLASE in the last 168 hours. No results for input(s): AMMONIA in the last 168 hours. Coagulation:  Lab Results  Component Value Date   INR 1.1 07/26/2013   No results found for: PTT Lactic Acid, Venous:  No results found for: LATICACIDVEN Cardiac Enzymes: No results for input(s): CKTOTAL, CKMB, CKMBINDEX, TROPONINI in the last 168 hours. Urinalysis:    Component Value Date/Time   COLORURINE CANCELED 04/17/2018 [place] (A) 01/30/2015 1634   APPEARANCEUR [place] 07/26/2013 2055   LABSPEC 1.014 01/30/2015 1634   LABSPEC 1.014 07/26/2013 2055   PHURINE 6.0 01/30/2015 [place] 01/30/2015 1634   GLUCOSEU Negative 07/26/2013 2055   HGBUR 3+ (A) 01/30/2015 1634   BILIRUBINUR NEGATIVE 01/30/2015 1634   BILIRUBINUR Negative 07/26/2013 2055   KETONESUR TRACE (A) 01/30/2015 1634   PROTEINUR 30 (A) 01/30/2015 1634   NITRITE NEGATIVE 01/30/2015 1634   LEUKOCYTESUR TRACE (A) 01/30/2015 1634   LEUKOCYTESUR 1+ 07/26/2013 2055    BNP (last 3 results) No results for input(s): PROBNP in the last 8760 hours. CBG: Recent Labs  Lab 04/20/18 1702  GLUCAP 411*    Radiological Exams on Admission: No results found.   R Foot XR on 04/16/2008 CLINICAL DATA:  Stepped on glass with acute RIGHT foot pain. Initial encounter.  EXAM: RIGHT FOOT COMPLETE - 3+ VIEW  COMPARISON:  None.  FINDINGS: Diffuse soft tissue swelling noted. A faint possible  3 mm linear foreign body within the superficial plantar soft tissues noted at the level of the metatarsal bases.  No acute fracture or dislocation.  Mild degenerative changes in the hindfoot and midfoot noted.  No suspicious focal bony lesions identified.  IMPRESSION: Equivocal 3 mm linear foreign body within the superficial plantar soft tissues at the level of the metatarsal bases.  Soft tissue swelling without acute bony abnormality.   Electronically Signed   By: Margarette Canada M.D.   On: 04/17/2018 11:49    EKG:  None ordered; reviewed EKG from 01/30/2015: NSR and RAD  I have reviewed the patient's previous electronic chart records, labs, and other data.   ASSESSMENT AND PLAN   Assessment: [name] is a 78 y.o. 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 Concern for home safety given reported poor sanitary conditions at home. Foot wound is now s/p I&D by ED provider -- drained significant purulent discharge. Last XR prior to drainage showed small 17mm foreign object superficially over R plantar foot.  Will treat with IV abx, repeat foot XR in AM, and depending on clinical improvement, consider orthopedics involvement.    Active Problems:   Diabetic foot infection ([place])   Plan:   # R plantar diabetic foot infection after injury from glass s/p I&D at [facility] ED on 3/22 > WBC 15.7; sugars in 400s; afebrile - s/p I&D on 3/22 - continue IV vancomycin (D1 3/22) and switch zosyn (3/22) --> ceftriaxone 2gm daily  - IV fluids with 1.5 L and maintenance at 100 cc/hr overnight - blood and wound cultures pending - wound care and wound consult - repeat R foot XR in AM to assess if foreign object still visible - based on exam tomorrow, consider ortho consult -  glucose control as below  # DMT2 previously on insulin, currently not on any medication due to ?financial barriers > sugars 400s on admission and A1c 13.5% recently - start lantus at 15 units tonight and uptitrate per sugars - medium SSI AC and fingersticks ACHS - consult to diabetes coordinator   # Home safety concerns  - PT and OT eval - SW consult pending, anticipate SNF   # Hypokalemia K 2.8 - s/p K 40 mEq repletion in ED - recheck labs in AM   # Chronic HTN with labile BP on admission (SBP 90-160s)  - hold off on anti-HTN overnight given BP lability  - currently normotensive  - consider resuming previous home lisinopril at 5 or 10mg  daily tomorrow pending BPs  DVT Prophylaxis: lovenox Code Status:  Full Code Family Communication: ED spoke with daughter-in-law; I discussed plan with patient  Disposition Plan: admit to floor for infection workup and treatment; dispo pending PT/OT and SW   Patient contact:  Daughter-in-law April Holding [phone])   Extended Emergency Contact Information Primary Emergency Contact: Maffett,Charles Address: [place], [place] [address] [place] Phone: [phone] Relation: Spouse Secondary Emergency Contact: Roberson,Edward Address: [address]          [place], [place] [address] [place] Phone: [phone] Relation: Son  Time spent: > 35 mins  Colbert Ewing, MD [facility] Pager [phone]  If 7PM-7AM, please contact night-coverage [email or web address] Password [facility] 04/20/2018, 8:37 PM

## 2018-04-20 NOTE — ED Notes (Signed)
Hospitalist at bedside at this time 

## 2018-04-21 ENCOUNTER — Ambulatory Visit (INDEPENDENT_AMBULATORY_CARE_PROVIDER_SITE_OTHER): Payer: Self-pay | Admitting: Physician Assistant

## 2018-04-21 DIAGNOSIS — L02619 Cutaneous abscess of unspecified foot: Secondary | ICD-10-CM | POA: Diagnosis present

## 2018-04-21 DIAGNOSIS — L089 Local infection of the skin and subcutaneous tissue, unspecified: Secondary | ICD-10-CM

## 2018-04-21 DIAGNOSIS — R7881 Bacteremia: Secondary | ICD-10-CM

## 2018-04-21 DIAGNOSIS — S90851A Superficial foreign body, right foot, initial encounter: Secondary | ICD-10-CM

## 2018-04-21 DIAGNOSIS — E11628 Type 2 diabetes mellitus with other skin complications: Secondary | ICD-10-CM

## 2018-04-21 DIAGNOSIS — E1365 Other specified diabetes mellitus with hyperglycemia: Secondary | ICD-10-CM

## 2018-04-21 DIAGNOSIS — M795 Residual foreign body in soft tissue: Secondary | ICD-10-CM

## 2018-04-21 DIAGNOSIS — E876 Hypokalemia: Secondary | ICD-10-CM

## 2018-04-21 LAB — BASIC METABOLIC PANEL
Anion gap: 11 (ref 5–15)
BUN: 14 mg/dL (ref 8–23)
CO2: 21 mmol/L — ABNORMAL LOW (ref 22–32)
Calcium: 8.1 mg/dL — ABNORMAL LOW (ref 8.9–10.3)
Chloride: 104 mmol/L (ref 98–111)
Creatinine, Ser: 1.07 mg/dL — ABNORMAL HIGH (ref 0.44–1.00)
GFR calc Af Amer: 58 mL/min — ABNORMAL LOW (ref >60)
GFR calc non Af Amer: 50 mL/min — ABNORMAL LOW (ref >60)
Glucose, Bld: 291 mg/dL — ABNORMAL HIGH (ref 70–99)
POTASSIUM: 3.4 mmol/L — AB (ref 3.5–5.1)
Sodium: 136 mmol/L (ref 135–145)

## 2018-04-21 LAB — BLOOD CULTURE ID PANEL (REFLEXED)
Acinetobacter baumannii: NOT DETECTED
CANDIDA ALBICANS: NOT DETECTED
Candida glabrata: NOT DETECTED
Candida krusei: NOT DETECTED
Candida parapsilosis: NOT DETECTED
Candida tropicalis: NOT DETECTED
ENTEROBACTERIACEAE SPECIES: NOT DETECTED
Enterobacter cloacae complex: NOT DETECTED
Enterococcus species: NOT DETECTED
Escherichia coli: NOT DETECTED
Haemophilus influenzae: NOT DETECTED
Klebsiella oxytoca: NOT DETECTED
Klebsiella pneumoniae: NOT DETECTED
Listeria monocytogenes: NOT DETECTED
Methicillin resistance: NOT DETECTED
NEISSERIA MENINGITIDIS: NOT DETECTED
Proteus species: NOT DETECTED
Pseudomonas aeruginosa: NOT DETECTED
STREPTOCOCCUS SPECIES: NOT DETECTED
Serratia marcescens: NOT DETECTED
Staphylococcus aureus (BCID): DETECTED — AB
Staphylococcus species: DETECTED — AB
Streptococcus agalactiae: NOT DETECTED
Streptococcus pneumoniae: NOT DETECTED
Streptococcus pyogenes: NOT DETECTED

## 2018-04-21 LAB — CBC WITH DIFFERENTIAL/PLATELET
Abs Immature Granulocytes: 0.07 10*3/uL (ref 0.00–0.07)
Basophils Absolute: 0.1 10*3/uL (ref 0.0–0.1)
Basophils Relative: 0 %
Eosinophils Absolute: 0.2 10*3/uL (ref 0.0–0.5)
Eosinophils Relative: 1 %
HCT: 39.1 % (ref 36.0–46.0)
Hemoglobin: 13.3 g/dL (ref 12.0–15.0)
Immature Granulocytes: 1 %
Lymphocytes Relative: 10 %
Lymphs Abs: 1.5 10*3/uL (ref 0.7–4.0)
MCH: 27.5 pg (ref 26.0–34.0)
MCHC: 34 g/dL (ref 30.0–36.0)
MCV: 80.8 fL (ref 80.0–100.0)
MONO ABS: 1.6 10*3/uL — AB (ref 0.1–1.0)
Monocytes Relative: 10 %
Neutro Abs: 11.7 10*3/uL — ABNORMAL HIGH (ref 1.7–7.7)
Neutrophils Relative %: 78 %
Platelets: 369 10*3/uL (ref 150–400)
RBC: 4.84 MIL/uL (ref 3.87–5.11)
RDW: 13.4 % (ref 11.5–15.5)
WBC: 15.1 10*3/uL — ABNORMAL HIGH (ref 4.0–10.5)
nRBC: 0 % (ref 0.0–0.2)

## 2018-04-21 LAB — MAGNESIUM: Magnesium: 1.7 mg/dL (ref 1.7–2.4)

## 2018-04-21 LAB — GLUCOSE, CAPILLARY
Glucose-Capillary: 164 mg/dL — ABNORMAL HIGH (ref 70–99)
Glucose-Capillary: 175 mg/dL — ABNORMAL HIGH (ref 70–99)
Glucose-Capillary: 218 mg/dL — ABNORMAL HIGH (ref 70–99)
Glucose-Capillary: 232 mg/dL — ABNORMAL HIGH (ref 70–99)
Glucose-Capillary: 284 mg/dL — ABNORMAL HIGH (ref 70–99)

## 2018-04-21 MED ORDER — POTASSIUM CHLORIDE CRYS ER 20 MEQ PO TBCR
40.0000 meq | EXTENDED_RELEASE_TABLET | Freq: Once | ORAL | Status: AC
  Administered 2018-04-21: 40 meq via ORAL
  Filled 2018-04-21: qty 2

## 2018-04-21 MED ORDER — METRONIDAZOLE IN NACL 5-0.79 MG/ML-% IV SOLN
500.0000 mg | Freq: Three times a day (TID) | INTRAVENOUS | Status: DC
  Administered 2018-04-21 (×2): 500 mg via INTRAVENOUS
  Filled 2018-04-21 (×3): qty 100

## 2018-04-21 MED ORDER — CHLORHEXIDINE GLUCONATE 4 % EX LIQD
60.0000 mL | Freq: Once | CUTANEOUS | Status: AC
  Administered 2018-04-21: 4 via TOPICAL
  Filled 2018-04-21: qty 60

## 2018-04-21 MED ORDER — SODIUM CHLORIDE 0.9 % IV SOLN
3.0000 g | Freq: Three times a day (TID) | INTRAVENOUS | Status: DC
  Administered 2018-04-21 – 2018-04-23 (×6): 3 g via INTRAVENOUS
  Filled 2018-04-21 (×6): qty 3

## 2018-04-21 MED ORDER — POVIDONE-IODINE 10 % EX SWAB
2.0000 "application " | Freq: Once | CUTANEOUS | Status: AC
  Administered 2018-04-21: 2 via TOPICAL

## 2018-04-21 MED ORDER — VANCOMYCIN HCL 10 G IV SOLR
1250.0000 mg | INTRAVENOUS | Status: DC
  Filled 2018-04-21: qty 1250

## 2018-04-21 NOTE — Progress Notes (Signed)
PT Cancellation Note  Patient Details Name: [name] MRN: [id number] DOB: [birth year]   Cancelled Treatment:    Reason Eval/Treat Not Completed: Medical issues which prohibited therapy Pending: "Dr. Sharol Given who recommended transfer to [facility] on 3/23 for surgery on 3/24" Pt will also need weight bearing order.  Will check back as schedule permits.   Johann Gascoigne,KATHrine E 04/21/2018, 9:38 AM  Carmelia Bake, PT, DPT Acute Rehabilitation Services Office: [phone] Pager: [phone]

## 2018-04-21 NOTE — Progress Notes (Signed)
CRITICAL VALUE ALERT  Critical Value:  Lactic acid 2.4  Date & Time Notified:  04/20/18 at 2120  Provider Notified: [place]  Orders Received/Actions taken: no new orders at this time

## 2018-04-21 NOTE — Progress Notes (Signed)
OT Cancellation Note  Patient Details Name: [name] MRN: [id number] DOB: 1940/12/25   Cancelled Treatment:      "Dr. Sharol Given who recommended transfer to [facility] on 3/23 for surgery on 3/24"  Kari Baars, [place] [phone] Office- [phone], Thereasa Parkin 04/21/2018, 1:44 PM

## 2018-04-21 NOTE — Progress Notes (Signed)
PHARMACY - PHYSICIAN COMMUNICATION CRITICAL VALUE ALERT - BLOOD CULTURE IDENTIFICATION (BCID)  [name] is an 78 y.o. female who presented to [facility] on 04/20/2018 with a chief complaint of right foot pain and swelling.  Assessment: Purulent abscess of right foot with plan to undergo I&D tomorrow.  BCID shows MSSA; abscess culture still pending.  Poorly controlled diabetic.  Afebrile, WBC improving slowly.  Name of physician (or Provider) Contacted:  Dr. Cruzita Lederer   Current antibiotics: vancomycin, ceftriaxone and metronidazole  Changes to prescribed antibiotics recommended:  Recommendations accepted by provider  D/C vancomycin Continue Rocephin and Flagyl until wound culture finalizes Repeat blood cultures  Results for orders placed or performed during the hospital encounter of 04/20/18  Blood Culture ID Panel (Reflexed) (Collected: 04/20/2018  7:54 PM)  Result Value Ref Range   Enterococcus species NOT DETECTED NOT DETECTED   Listeria monocytogenes NOT DETECTED NOT DETECTED   Staphylococcus species DETECTED (A) NOT DETECTED   Staphylococcus aureus (BCID) DETECTED (A) NOT DETECTED   Methicillin resistance NOT DETECTED NOT DETECTED   Streptococcus species NOT DETECTED NOT DETECTED   Streptococcus agalactiae NOT DETECTED NOT DETECTED   Streptococcus pneumoniae NOT DETECTED NOT DETECTED   Streptococcus pyogenes NOT DETECTED NOT DETECTED   Acinetobacter baumannii NOT DETECTED NOT DETECTED   Enterobacteriaceae species NOT DETECTED NOT DETECTED   Enterobacter cloacae complex NOT DETECTED NOT DETECTED   Escherichia coli NOT DETECTED NOT DETECTED   Klebsiella oxytoca NOT DETECTED NOT DETECTED   Klebsiella pneumoniae NOT DETECTED NOT DETECTED   Proteus species NOT DETECTED NOT DETECTED   Serratia marcescens NOT DETECTED NOT DETECTED   Haemophilus influenzae NOT DETECTED NOT DETECTED   Neisseria meningitidis NOT DETECTED NOT DETECTED   Pseudomonas aeruginosa NOT DETECTED  NOT DETECTED   Candida albicans NOT DETECTED NOT DETECTED   Candida glabrata NOT DETECTED NOT DETECTED   Candida krusei NOT DETECTED NOT DETECTED   Candida parapsilosis NOT DETECTED NOT DETECTED   Candida tropicalis NOT DETECTED NOT DETECTED    Chanel Mckesson D. Mina Marble, PharmD, BCPS, [place] 04/21/2018, 4:57 PM

## 2018-04-21 NOTE — Progress Notes (Signed)
Pharmacy Antibiotic Note  [name] is a 78 y.o. female on day #2 antibiotics for polymicrobial foot abscess. Vancomycin just stopped tonight with MSSA in BCID.  Pharmacy has been consulted for Unasyn dosing per ID, consolidating from Ceftriaxone and Metronidazole.    Plan:  Unasyn 3 gm IV q8hrs.  Will follow renal function, culture data, TEE and antibiotic plans.  Height: 5' 4.5" (163.8 cm) Weight: 202 lb 13.2 oz (92 kg) IBW/kg (Calculated) : 55.85  Temp (24hrs), Avg:98.4 F (36.9 C), Min:98 F (36.7 C), Max:99.4 F (37.4 C)  Recent Labs  Lab 04/17/18 1019 04/20/18 1717 04/20/18 1951 04/20/18 2219 04/21/18 0410  WBC 12.6* 15.7*  --   --  15.1*  CREATININE 0.88 0.91  --   --  1.07*  LATICACIDVEN  --   --  2.4* 1.1  --     Estimated Creatinine Clearance: 49.6 mL/min (A) (by C-G formula based on SCr of 1.07 mg/dL (H)).    No Known Allergies  Antimicrobials this admission:  Zosyn x 1 on 3/22  Vancomycin 3/22>>3/23 (received 1 dose on 3/22 pm)  Ceftriaxone 3/23 x 1 dose  Metronidazole 3/23 x 2 doses  Unasyn 3/23>>  Dose adjustments this admission:  n/a  Microbiology results:  3/23 repeat blood cultures ordered -  3/22 blood x 2: 1 of 2 cultures with MSSA per BCID  3/22 abscess - moderate gram positive rods, few gram positive cocci in chains, rare gram negative rods - pending  Thank you for allowing pharmacy to be a part of this patient's care.  Arty Baumgartner, [place] Pager: [phone] or phone: [phone] 04/21/2018 5:49 PM

## 2018-04-21 NOTE — H&P (View-Only) (Signed)
ORTHOPAEDIC CONSULTATION  REQUESTING PHYSICIAN: Caren Griffins, MD  Chief Complaint: Pain ulceration and purulent abscess lateral aspect of the right foot.  HPI: [name] is a 78 y.o. female who presents with uncontrolled type 2 diabetes with abscess ulceration and purulent drainage lateral aspect right foot with history of stepping on broken glass.  Past Medical History:  Diagnosis Date  . Abdominal distension   . Anemia   . Anxiety   . B12 deficiency   . Balance problem   . Blood in stool   . Cancer (HCC)    OVARIAN  . Colon polyps   . Dementia ([place])    EARLY  . Dementia ([place])    EARLY  . Depression   . GERD (gastroesophageal reflux disease)   . History of blood transfusion   . Hypertension   . Hypomagnesemia   . Urine incontinence    Past Surgical History:  Procedure Laterality Date  . ABDOMINAL HYSTERECTOMY    . CATARACT EXTRACTION W/PHACO Right 07/26/2015   Procedure: CATARACT EXTRACTION PHACO AND INTRAOCULAR LENS PLACEMENT (IOC);  Surgeon: Birder Robson, MD;  Location: [facility] ORS;  Service: Ophthalmology;  Laterality: Right;  [name]   1:03.5 AP%  23% CDE   14.59 fluid pack lot # [id number]  . CATARACT EXTRACTION W/PHACO Left 08/29/2015   Procedure: CATARACT EXTRACTION PHACO AND INTRAOCULAR LENS PLACEMENT ([place]);  Surgeon: Birder Robson, MD;  Location: [facility] ORS;  Service: Ophthalmology;  Laterality: Left;  [name] 00:40 AP% 20.9 CDE 8.50 fluid pack lot # [id number]  . COLONOSCOPY WITH PROPOFOL N/A 03/14/2015   Procedure: COLONOSCOPY WITH PROPOFOL;  Surgeon: Hulen Luster, MD;  Location: [facility] ENDOSCOPY;  Service: Gastroenterology;  Laterality: N/A;  . ESOPHAGOGASTRODUODENOSCOPY (EGD) WITH PROPOFOL N/A 03/14/2015   Procedure: ESOPHAGOGASTRODUODENOSCOPY (EGD) WITH PROPOFOL;  Surgeon: Hulen Luster, MD;  Location: [facility] ENDOSCOPY;  Service: Gastroenterology;  Laterality: N/A;  . none    . TOTAL ABDOMINAL HYSTERECTOMY W/ BILATERAL SALPINGOOPHORECTOMY  04/19/2015   exploratory laparoscopy, total abdominal hysterectomy, bilateral salping oophorectomy, left pelvic lymph node dissection, omentectomy   Social History   Socioeconomic History  . Marital status: Legally Separated    Spouse name: Not on file  . Number of children: Not on file  . Years of education: Not on file  . Highest education level: Not on file  Occupational History  . Not on file  Social Needs  . Financial resource strain: Not on file  . Food insecurity:    Worry: Not on file    Inability: Not on file  . Transportation needs:    Medical: Not on file    Non-medical: Not on file  Tobacco Use  . Smoking status: Never Smoker  . Smokeless tobacco: Never Used  Substance and Sexual Activity  . Alcohol use: No  . Drug use: No  . Sexual activity: Not Currently  Lifestyle  . Physical activity:    Days per week: Not on file    Minutes per session: Not on file  . Stress: Not on file  Relationships  . Social connections:    Talks on phone: Not on file    Gets together: Not on file    Attends religious service: Not on file    Active member of club or organization: Not on file    Attends meetings of clubs or organizations: Not on file    Relationship status: Not on file  Other Topics Concern  . Not on file  Social History Narrative  .  Not on file   Family History  Family history unknown: Yes   - negative except otherwise stated in the family history section No Known Allergies Prior to Admission medications   Medication Sig Start Date End Date Taking? Authorizing Provider  calcium carbonate (TUMS - DOSED IN MG ELEMENTAL CALCIUM) 500 MG chewable tablet Chew 1 tablet by mouth every 6 (six) hours as needed for indigestion or heartburn.    [provider]  cholecalciferol (VITAMIN D) 400 units TABS tablet Take 1,000 Units by mouth daily.    [provider]  citalopram (CELEXA) 10 MG tablet Take 10 mg by mouth daily.    [provider]  clindamycin  (CLEOCIN) 300 MG capsule Take 1 capsule (300 mg total) by mouth 3 (three) times daily. 04/17/18   [name], [name], NP  Continuous Blood Gluc Receiver (FREESTYLE LIBRE READER) DEVI 1 Units by Does not apply route every 14 (fourteen) days. 04/17/18   [name], [name], NP  Continuous Blood Gluc Sensor (FREESTYLE LIBRE 14 DAY SENSOR) MISC 1 Units by Does not apply route every 14 (fourteen) days. 04/17/18   [name], [name], NP  fenofibrate (TRICOR) 145 MG tablet Take 1 tablet (145 mg total) by mouth daily. 04/12/16   [name], [name], NP  ibuprofen (ADVIL,MOTRIN) 600 MG tablet Take 600 mg by mouth every 6 (six) hours as needed.    [provider]  insulin glargine (LANTUS) 100 unit/mL SOPN Inject 0.1 mLs (10 Units total) into the skin at bedtime. 04/17/18   [name], [name], NP  insulin lispro protamine-lispro (HUMALOG 75/25 MIX) (75-25) 100 UNIT/ML SUSP injection Inject 10 Units into the skin 2 (two) times daily with a meal. 04/18/18   [name], [name], NP  Insulin Pen Needle (PEN NEEDLES) 31G X 8 MM MISC 1 application by Does not apply route at bedtime. 04/17/18   [name], [name], NP  Insulin Syringe-Needle U-100 (INSULIN SYRINGE .5CC/30GX1/2") 30G X 1/2" 0.5 ML MISC 1 Units by Does not apply route 2 (two) times daily after a meal. 04/18/18   [name], [name], NP  lisinopril (PRINIVIL,ZESTRIL) 5 MG tablet Take 1 tablet (5 mg total) by mouth daily. 04/18/18   [name], [name], NP  ondansetron (ZOFRAN) 4 MG tablet Take 4 mg by mouth every 8 (eight) hours as needed for nausea or vomiting.    [provider]  SitaGLIPtin-MetFORMIN HCl 50-1000 MG TB24 Take 1 tablet by mouth daily after lunch. 04/12/16   [name], [name], NP   Dg Foot Complete Right  Result Date: 04/20/2018 CLINICAL DATA:  Foot pain and swelling EXAM: RIGHT FOOT COMPLETE - 3+ VIEW COMPARISON:  04/17/2018 FINDINGS: Previously seen linear foreign body in the plantar soft tissues of the foot is again  identified with appears slightly deeper. Adjacent soft tissue swelling is noted consistent with the given clinical history. No acute fracture is seen. Tarsal degenerative changes. IMPRESSION: No acute fractures noted. Previously seen foreign body is again identified slightly deeper in the plantar soft tissues. Electronically Signed   By: Inez Catalina M.D.   On: 04/20/2018 21:14   - pertinent xrays, CT, MRI studies were reviewed and independently interpreted  Positive ROS: All other systems have been reviewed and were otherwise negative with the exception of those mentioned in the HPI and as above.  Physical Exam: General: Alert, no acute distress Psychiatric: Patient is competent for consent with normal mood and affect Lymphatic: No axillary or cervical lymphadenopathy Cardiovascular: No pedal edema Respiratory:  No cyanosis, no use of accessory musculature GI: No organomegaly, abdomen is soft and non-tender    Images:  @ENCIMAGES @  Labs:  Lab Results  Component Value Date   HGBA1C 13.5 (H) 04/17/2018   HGBA1C 12.5 (H) 03/09/2016   ESRSEDRATE 56 (H) 04/20/2018   REPTSTATUS PENDING 04/20/2018   GRAMSTAIN  04/20/2018    FEW WBC PRESENT, PREDOMINANTLY PMN MODERATE GRAM POSITIVE RODS FEW GRAM POSITIVE COCCI IN CHAINS RARE GRAM NEGATIVE RODS Performed at [facility], [place] [address] [place],  [address]    CULT PENDING 04/20/2018    Lab Results  Component Value Date   ALBUMIN 3.5 04/20/2018   ALBUMIN 4.0 04/17/2018   ALBUMIN 3.0 (L) 01/30/2015    Neurologic: Patient does not have protective sensation bilateral lower extremities.   MUSCULOSKELETAL:   Skin: Examination patient has a strong dorsalis pedis pulse bilaterally.  No ulcerations or swelling in the left foot.  Examination the right foot she has a purulent ulcer on the lateral aspect of the right foot at the level of the base of the fifth metatarsal.  Review of the radiographs shows no air in the soft  tissue does show a very small retained foreign body.  Patient's hemoglobin A1c is 13.5.  Assessment: Assessment: Uncontrolled type 2 diabetes with abscess ulceration lateral aspect of the right foot with retained foreign body.  Plan: Plan: We will plan for debridement of the right foot abscess tomorrow morning Tuesday.    Discussed with patient depending on the severity of infection we may need to return to the operating room on Friday for repeat debridement.  Risks and benefits were discussed including persistent infection nonhealing of the wound need for further surgery.  Thank you for the consult and the opportunity to see Ms. Wayne Both, MD [place] [phone] 4:16 PM

## 2018-04-21 NOTE — Progress Notes (Signed)
PROGRESS NOTE  [name] PRF:[id number] DOB: 10-14-1940 DOA: 04/20/2018 PCP: Flossie Buffy, NP   LOS: 1 day   Brief Narrative / Interim history: [name] is a 78 y.o. female with hx of, GERD, ovarian cancer s/p tumor resection in 2017, and uncontrolled DMT2 on insulin (currently not taking insulin) who presented with worsening R foot pain and swelling x 1 week. Patient reports that she accidentally stepped on broken glass about 2 weeks ago. Since then, noticed pain with weight bearing on the R foot and swelling of the foot and ankle. She was seen by outpatient podiatry on 3/19 where an XR showed soft tissue swelling and possible 13mm foreign object in the superficial R plantar soft tissue.  She saw podiatry as an outpatient on 3/19 and plans were for patient to get an IND, received the block however did not wait for the doctor to come back, claims like she was never told how long she would have to wait and got upset for not being communicated with appropriately and left.  She was advised to go to the ED at that time but patient's husband brought her home instead.  Came back to the ER on 3/22 with worsening right foot pain and swelling.  In the ED had an I&D, however follow-up x-ray showed persistent foreign body in her right foot, slightly deeper  Subjective: She has no chest pain, no shortness of breath, denies any abdominal pain, nausea or vomiting.  She complains of right foot pain  Assessment & Plan: Active Problems:   Diabetic foot infection ([place])   Principal Problem Right plantar diabetic foot infection after injury from glass couple of weeks ago with sepsis physiology -Tachycardic, leukocytosis present the ED, she is status post IND on 3/22, cultures were sent and showed moderate gram-positive rods, few gram-positive cocci and rare gram-negative rods.  Follow-up x-ray showed persistent foreign body -Cover broadly with vancomycin, ceftriaxone as well as  metronidazole for anaerobes -Consulted orthopedic surgery, Dr. Alvan Dame who is on for [facility] long.  He in turn discussed with Dr. Sharol Given who recommended transfer to [facility] on 3/23 for surgery on 3/24.   Active Problems Poorly controlled type 2 diabetes mellitus with hyperglycemia -Most recent hemoglobin A1c 13.5 on 04/17/2018.  Sugars in the 400s on admission.  Patient is not on insulin at home.  She was started on Lantus 50 units along with moderate sliding scale, CBGs improved somewhat to 91 this morning.  Continue same regimen  Home safety concerns  - PT and OT eval  Hypokalemia K 2.8 - s/p K 40 mEq repletion in ED, potassium improved to 3.4 this morning -Continue to monitor  Chronic HTN with labile BP on admission (SBP 90-160s) -Blood pressure reasonable 144/75 this morning, continue to hold home medication and based on trends her home lisinopril could be resumed in 1 point when indicated   Scheduled Meds: . enoxaparin (LOVENOX) injection  40 mg Subcutaneous Q24H  . insulin aspart  0-15 Units Subcutaneous TID WC  . insulin glargine  15 Units Subcutaneous QHS  . potassium chloride  40 mEq Oral Once  . senna-docusate  1 tablet Oral QHS   Continuous Infusions: . sodium chloride 100 mL/hr at 04/21/18 0600  . cefTRIAXone (ROCEPHIN)  IV 2 g (04/21/18 0428)  . sodium chloride     PRN Meds:.acetaminophen, morphine injection   DVT prophylaxis: SCDs Code Status: Full code Family Communication: no family at bedside  Disposition Plan: transfer [facility]  Consultants:   Orthopedic surgery   Procedures:   None   Antimicrobials:  Vancomycin 3/22  Ceftriaxone 3/22  Metronidazole 3/23  Objective: Vitals:   04/20/18 2030 04/20/18 2031 04/20/18 2140 04/21/18 0626  BP: 120/60 120/60 129/76 (!) 144/75  Pulse:  99 86 77  Resp:  15 16 16   Temp:  98.2 F (36.8 C) 99.4 F (37.4 C) 98.5 F (36.9 C)  TempSrc:   Oral Oral  SpO2:  98% 98% 95%  Weight:      Height:         Intake/Output Summary (Last 24 hours) at 04/21/2018 5732 Last data filed at 04/21/2018 0600 Gross per 24 hour  Intake 2880 ml  Output 300 ml  Net 2580 ml   Filed Weights   04/20/18 1703  Weight: 92 kg    Examination:  Constitutional: NAD Eyes: PERRL, lids and conjunctivae normal ENMT: Mucous membranes are moist.  Respiratory: clear to auscultation bilaterally, no wheezing, no crackles.  Cardiovascular: Regular rate and rhythm, no murmurs / rubs / gallops. No LE edema. Abdomen: no tenderness. Bowel sounds positive.  Musculoskeletal: no clubbing / cyanosis.  Skin: no rashes, right foot ACE wrapped Neurologic: CN 2-12 grossly intact. Strength 5/5 in all 4.  Psychiatric: Normal judgment and insight. Alert and oriented x 3. Normal mood.    Data Reviewed: I have independently reviewed following labs and imaging studies   CBC: Recent Labs  Lab 04/17/18 1019 04/20/18 1717 04/21/18 0410  WBC 12.6* 15.7* 15.1*  NEUTROABS 10.3* 12.4* 11.7*  HGB 15.5* 15.2* 13.3  HCT 45.5 42.7 39.1  MCV 82.7 78.1* 80.8  PLT 326.0 377 202   Basic Metabolic Panel: Recent Labs  Lab 04/17/18 1019 04/20/18 1717 04/21/18 0410  NA 136 132* 136  K 3.7 2.8* 3.4*  CL 96 96* 104  CO2 28 24 21*  GLUCOSE 341* 420* 291*  BUN 8 13 14   CREATININE 0.88 0.91 1.07*  CALCIUM 9.5 8.9 8.1*  MG  --   --  1.7   GFR: Estimated Creatinine Clearance: 49.6 mL/min (A) (by C-G formula based on SCr of 1.07 mg/dL (H)). Liver Function Tests: Recent Labs  Lab 04/17/18 1019 04/20/18 1717  AST 10 18  ALT 11 18  ALKPHOS 91 115  BILITOT 1.3* 0.9  PROT 7.6 8.4*  ALBUMIN 4.0 3.5   No results for input(s): LIPASE, AMYLASE in the last 168 hours. No results for input(s): AMMONIA in the last 168 hours. Coagulation Profile: No results for input(s): INR, PROTIME in the last 168 hours. Cardiac Enzymes: No results for input(s): CKTOTAL, CKMB, CKMBINDEX, TROPONINI in the last 168 hours. BNP (last 3 results) No  results for input(s): PROBNP in the last 8760 hours. HbA1C: No results for input(s): HGBA1C in the last 72 hours. CBG: Recent Labs  Lab 04/20/18 1702 04/21/18 0041  GLUCAP 411* 284*   Lipid Profile: No results for input(s): CHOL, HDL, LDLCALC, TRIG, CHOLHDL, LDLDIRECT in the last 72 hours. Thyroid Function Tests: No results for input(s): TSH, T4TOTAL, FREET4, T3FREE, THYROIDAB in the last 72 hours. Anemia Panel: No results for input(s): VITAMINB12, FOLATE, FERRITIN, TIBC, IRON, RETICCTPCT in the last 72 hours. Urine analysis:    Component Value Date/Time   COLORURINE CANCELED 04/17/2018 1019   APPEARANCEUR CLEAR (A) 01/30/2015 1634   APPEARANCEUR Hazy 07/26/2013 2055   LABSPEC 1.014 01/30/2015 1634   LABSPEC 1.014 07/26/2013 2055   PHURINE 6.0 01/30/2015 1634   GLUCOSEU NEGATIVE 01/30/2015 1634   GLUCOSEU Negative 07/26/2013  2055   HGBUR 3+ (A) 01/30/2015 1634   BILIRUBINUR NEGATIVE 01/30/2015 1634   BILIRUBINUR Negative 07/26/2013 2055   KETONESUR TRACE (A) 01/30/2015 1634   PROTEINUR 30 (A) 01/30/2015 1634   NITRITE NEGATIVE 01/30/2015 1634   LEUKOCYTESUR TRACE (A) 01/30/2015 1634   LEUKOCYTESUR 1+ 07/26/2013 2055   Sepsis Labs: Invalid input(s): PROCALCITONIN, LACTICIDVEN  Recent Results (from the past 240 hour(s))  Blood culture (routine x 2)     Status: None (Preliminary result)   Collection Time: 04/20/18  7:49 PM  Result Value Ref Range Status   Specimen Description   Final    BLOOD LEFT FOREARM Performed at [facility], [address], [place], [place] [address]    Special Requests   Final    BOTTLES DRAWN AEROBIC AND ANAEROBIC Blood Culture results may not be optimal due to an excessive volume of blood received in culture bottles Performed at [place] [address] [place], [place] [address]    Culture PENDING  Incomplete   Report Status PENDING  Incomplete  Wound or Superficial Culture     Status: None (Preliminary  result)   Collection Time: 04/20/18  7:50 PM  Result Value Ref Range Status   Specimen Description   Final    ABSCESS Performed at [place] [address] [place], [place] [address]    Special Requests   Final    Immunocompromised Performed at [facility], [place] [address], [place], [place] [address]    Gram Stain   Final    FEW WBC PRESENT, PREDOMINANTLY PMN MODERATE GRAM POSITIVE RODS FEW GRAM POSITIVE COCCI IN CHAINS RARE GRAM NEGATIVE RODS Performed at [facility], [place] [address], [place], [place] [address]    Culture PENDING  Incomplete   Report Status PENDING  Incomplete  Blood culture (routine x 2)     Status: None (Preliminary result)   Collection Time: 04/20/18  7:54 PM  Result Value Ref Range Status   Specimen Description   Final    BLOOD RIGHT FOREARM Performed at [facility], [place] [address] [place], [place] [address]    Special Requests   Final    BOTTLES DRAWN AEROBIC AND ANAEROBIC Blood Culture adequate volume Performed at [place] [address] [place],  [address]    Culture PENDING  Incomplete   Report Status PENDING  Incomplete      Radiology Studies: Dg Foot Complete Right  Result Date: 04/20/2018 CLINICAL DATA:  Foot pain and swelling EXAM: RIGHT FOOT COMPLETE - 3+ VIEW COMPARISON:  04/17/2018 FINDINGS: Previously seen linear foreign body in the plantar soft tissues of the foot is again identified with appears slightly deeper. Adjacent soft tissue swelling is noted consistent with the given clinical history. No acute fracture is seen. Tarsal degenerative changes. IMPRESSION: No acute fractures noted. Previously seen foreign body is again identified slightly deeper in the plantar soft tissues. Electronically Signed   By: Inez Catalina M.D.   On: 04/20/2018 21:14    Marzetta Board, MD, PhD [facility]  Contact via  [email or web address]  [place] P:  [phone]  F: [phone]  \

## 2018-04-21 NOTE — Addendum Note (Signed)
Addended by: Lynnea Ferrier on: 04/21/2018 08:38 AM   Modules accepted: Orders

## 2018-04-21 NOTE — Consult Note (Signed)
ORTHOPAEDIC CONSULTATION  REQUESTING PHYSICIAN: Caren Griffins, MD  Chief Complaint: Pain ulceration and purulent abscess lateral aspect of the right foot.  HPI: [name] is a 78 y.o. female who presents with uncontrolled type 2 diabetes with abscess ulceration and purulent drainage lateral aspect right foot with history of stepping on broken glass.  Past Medical History:  Diagnosis Date  . Abdominal distension   . Anemia   . Anxiety   . B12 deficiency   . Balance problem   . Blood in stool   . Cancer (HCC)    OVARIAN  . Colon polyps   . Dementia ([place])    EARLY  . Dementia ([place])    EARLY  . Depression   . GERD (gastroesophageal reflux disease)   . History of blood transfusion   . Hypertension   . Hypomagnesemia   . Urine incontinence    Past Surgical History:  Procedure Laterality Date  . ABDOMINAL HYSTERECTOMY    . CATARACT EXTRACTION W/PHACO Right 07/26/2015   Procedure: CATARACT EXTRACTION PHACO AND INTRAOCULAR LENS PLACEMENT (IOC);  Surgeon: Birder Robson, MD;  Location: [facility] ORS;  Service: Ophthalmology;  Laterality: Right;  [name]   1:03.5 AP%  23% CDE   14.59 fluid pack lot # [id number]  . CATARACT EXTRACTION W/PHACO Left 08/29/2015   Procedure: CATARACT EXTRACTION PHACO AND INTRAOCULAR LENS PLACEMENT ([place]);  Surgeon: Birder Robson, MD;  Location: [facility] ORS;  Service: Ophthalmology;  Laterality: Left;  [name] 00:40 AP% 20.9 CDE 8.50 fluid pack lot # [id number]  . COLONOSCOPY WITH PROPOFOL N/A 03/14/2015   Procedure: COLONOSCOPY WITH PROPOFOL;  Surgeon: Hulen Luster, MD;  Location: [facility] ENDOSCOPY;  Service: Gastroenterology;  Laterality: N/A;  . ESOPHAGOGASTRODUODENOSCOPY (EGD) WITH PROPOFOL N/A 03/14/2015   Procedure: ESOPHAGOGASTRODUODENOSCOPY (EGD) WITH PROPOFOL;  Surgeon: Hulen Luster, MD;  Location: [facility] ENDOSCOPY;  Service: Gastroenterology;  Laterality: N/A;  . none    . TOTAL ABDOMINAL HYSTERECTOMY W/ BILATERAL SALPINGOOPHORECTOMY  04/19/2015   exploratory laparoscopy, total abdominal hysterectomy, bilateral salping oophorectomy, left pelvic lymph node dissection, omentectomy   Social History   Socioeconomic History  . Marital status: Legally Separated    Spouse name: Not on file  . Number of children: Not on file  . Years of education: Not on file  . Highest education level: Not on file  Occupational History  . Not on file  Social Needs  . Financial resource strain: Not on file  . Food insecurity:    Worry: Not on file    Inability: Not on file  . Transportation needs:    Medical: Not on file    Non-medical: Not on file  Tobacco Use  . Smoking status: Never Smoker  . Smokeless tobacco: Never Used  Substance and Sexual Activity  . Alcohol use: No  . Drug use: No  . Sexual activity: Not Currently  Lifestyle  . Physical activity:    Days per week: Not on file    Minutes per session: Not on file  . Stress: Not on file  Relationships  . Social connections:    Talks on phone: Not on file    Gets together: Not on file    Attends religious service: Not on file    Active member of club or organization: Not on file    Attends meetings of clubs or organizations: Not on file    Relationship status: Not on file  Other Topics Concern  . Not on file  Social History Narrative  .  Not on file   Family History  Family history unknown: Yes   - negative except otherwise stated in the family history section No Known Allergies Prior to Admission medications   Medication Sig Start Date End Date Taking? Authorizing Provider  calcium carbonate (TUMS - DOSED IN MG ELEMENTAL CALCIUM) 500 MG chewable tablet Chew 1 tablet by mouth every 6 (six) hours as needed for indigestion or heartburn.    [provider]  cholecalciferol (VITAMIN D) 400 units TABS tablet Take 1,000 Units by mouth daily.    [provider]  citalopram (CELEXA) 10 MG tablet Take 10 mg by mouth daily.    [provider]  clindamycin  (CLEOCIN) 300 MG capsule Take 1 capsule (300 mg total) by mouth 3 (three) times daily. 04/17/18   [name], [name], NP  Continuous Blood Gluc Receiver (FREESTYLE LIBRE READER) DEVI 1 Units by Does not apply route every 14 (fourteen) days. 04/17/18   [name], [name], NP  Continuous Blood Gluc Sensor (FREESTYLE LIBRE 14 DAY SENSOR) MISC 1 Units by Does not apply route every 14 (fourteen) days. 04/17/18   [name], [name], NP  fenofibrate (TRICOR) 145 MG tablet Take 1 tablet (145 mg total) by mouth daily. 04/12/16   [name], [name], NP  ibuprofen (ADVIL,MOTRIN) 600 MG tablet Take 600 mg by mouth every 6 (six) hours as needed.    [provider]  insulin glargine (LANTUS) 100 unit/mL SOPN Inject 0.1 mLs (10 Units total) into the skin at bedtime. 04/17/18   [name], [name], NP  insulin lispro protamine-lispro (HUMALOG 75/25 MIX) (75-25) 100 UNIT/ML SUSP injection Inject 10 Units into the skin 2 (two) times daily with a meal. 04/18/18   [name], [name], NP  Insulin Pen Needle (PEN NEEDLES) 31G X 8 MM MISC 1 application by Does not apply route at bedtime. 04/17/18   [name], [name], NP  Insulin Syringe-Needle U-100 (INSULIN SYRINGE .5CC/30GX1/2") 30G X 1/2" 0.5 ML MISC 1 Units by Does not apply route 2 (two) times daily after a meal. 04/18/18   [name], [name], NP  lisinopril (PRINIVIL,ZESTRIL) 5 MG tablet Take 1 tablet (5 mg total) by mouth daily. 04/18/18   [name], [name], NP  ondansetron (ZOFRAN) 4 MG tablet Take 4 mg by mouth every 8 (eight) hours as needed for nausea or vomiting.    [provider]  SitaGLIPtin-MetFORMIN HCl 50-1000 MG TB24 Take 1 tablet by mouth daily after lunch. 04/12/16   [name], [name], NP   Dg Foot Complete Right  Result Date: 04/20/2018 CLINICAL DATA:  Foot pain and swelling EXAM: RIGHT FOOT COMPLETE - 3+ VIEW COMPARISON:  04/17/2018 FINDINGS: Previously seen linear foreign body in the plantar soft tissues of the foot is again  identified with appears slightly deeper. Adjacent soft tissue swelling is noted consistent with the given clinical history. No acute fracture is seen. Tarsal degenerative changes. IMPRESSION: No acute fractures noted. Previously seen foreign body is again identified slightly deeper in the plantar soft tissues. Electronically Signed   By: Inez Catalina M.D.   On: 04/20/2018 21:14   - pertinent xrays, CT, MRI studies were reviewed and independently interpreted  Positive ROS: All other systems have been reviewed and were otherwise negative with the exception of those mentioned in the HPI and as above.  Physical Exam: General: Alert, no acute distress Psychiatric: Patient is competent for consent with normal mood and affect Lymphatic: No axillary or cervical lymphadenopathy Cardiovascular: No pedal edema Respiratory:  No cyanosis, no use of accessory musculature GI: No organomegaly, abdomen is soft and non-tender    Images:  @ENCIMAGES @  Labs:  Lab Results  Component Value Date   HGBA1C 13.5 (H) 04/17/2018   HGBA1C 12.5 (H) 03/09/2016   ESRSEDRATE 56 (H) 04/20/2018   REPTSTATUS PENDING 04/20/2018   GRAMSTAIN  04/20/2018    FEW WBC PRESENT, PREDOMINANTLY PMN MODERATE GRAM POSITIVE RODS FEW GRAM POSITIVE COCCI IN CHAINS RARE GRAM NEGATIVE RODS Performed at [facility], [place] [address] [place], [place] [address]    CULT PENDING 04/20/2018    Lab Results  Component Value Date   ALBUMIN 3.5 04/20/2018   ALBUMIN 4.0 04/17/2018   ALBUMIN 3.0 (L) 01/30/2015    Neurologic: Patient does not have protective sensation bilateral lower extremities.   MUSCULOSKELETAL:   Skin: Examination patient has a strong dorsalis pedis pulse bilaterally.  No ulcerations or swelling in the left foot.  Examination the right foot she has a purulent ulcer on the lateral aspect of the right foot at the level of the base of the fifth metatarsal.  Review of the radiographs shows no air in the soft  tissue does show a very small retained foreign body.  Patient's hemoglobin A1c is 13.5.  Assessment: Assessment: Uncontrolled type 2 diabetes with abscess ulceration lateral aspect of the right foot with retained foreign body.  Plan: Plan: We will plan for debridement of the right foot abscess tomorrow morning Tuesday.    Discussed with patient depending on the severity of infection we may need to return to the operating room on Friday for repeat debridement.  Risks and benefits were discussed including persistent infection nonhealing of the wound need for further surgery.  Thank you for the consult and the opportunity to see Ms. Wayne Both, MD [place] [phone] 4:16 PM

## 2018-04-21 NOTE — Consult Note (Signed)
[facility] Nurse wound consult note Reason for Consult:COnsulted for right lateral posterior foot infection. Trauma from glass.  Transferring to [facility] for orthopedic surgery with Dr Sharol Given.  Continue dry dressing and defer to surgical services.  Wound type:trauma /glass in foot.  Pressure Injury POA: NA Measurement: 3 cm x 3 cm maceration with surrounding induration.  I & D Site draining moderate purulence with assessment.  Wound bed:not visible Drainage (amount, consistency, odor) moderate purulence  No odor noted Periwound:erythema and induration Dressing procedure/placement/frequency: Dry dressing and defer to surgical service.  Will not follow at this time.  Please re-consult if needed.  Domenic Moras MSN, RN, FNP-BC CWON Wound, Ostomy, Continence Nurse Pager [phone]

## 2018-04-21 NOTE — Consult Note (Addendum)
[facility] for Infectious Disease    Date of Admission:  04/20/2018   Total days of antibiotics 1              Reason for Consult: Automatic consultation for staph aureus bacteremia     Assessment: She has developed a polymicrobial abscess and MSSA bacteremia after stepping on glass 2 weeks ago.  I will consolidate antibiotic therapy to ampicillin sulbactam pending final culture results.  I have ordered repeat blood cultures and TTE.  Plan: 1. Consolidate ceftriaxone and metronidazole to ampicillin sulbactam pending final cultures 2. Repeat blood cultures 3. TTE 4. I will provide a full, formal consultation in the morning  Principal Problem:   Bacteremia due to methicillin susceptible Staphylococcus aureus (MSSA) Active Problems:   Foreign body in right foot   Diabetic foot infection ([place])   Abscess of foot   Scheduled Meds: . insulin aspart  0-15 Units Subcutaneous TID WC  . insulin glargine  15 Units Subcutaneous QHS  . senna-docusate  1 tablet Oral QHS   Continuous Infusions: . cefTRIAXone (ROCEPHIN)  IV 2 g (04/21/18 0428)  . metronidazole 500 mg (04/21/18 1640)  . sodium chloride     PRN Meds:.acetaminophen, morphine injection  HPI: [name] is a 78 y.o. female with diabetes who has been off of her medications for several months due to issues with her Medicare.  Stepped on broken glass about 2 weeks ago and started developing right foot pain and swelling about 1 week ago.  She was seen by Dr. Ila Mcgill (podiatry) 04/17/2018 who recommended incision and drainage.  She did not want to go to the emergency department because of concerns about Covid-19.  She was prescribed clindamycin but did not fill the prescription.  She presented to the emergency department last night after her family became concerned.  She was afebrile but was noted to have an abscess forming on her right lateral foot.  The family had noted feces and urine on the floor at home  and ED staff noted feces on her foot.  She underwent incision and drainage of the abscess with a release of copious purulent material.  Abscess Gram stain shows gram-positive rods, gram-positive cocci in chains and gram-negative rods.  1 of 2 admission blood cultures is growing MSSA.  Debridement in the OR is planned for tomorrow morning.   Review of Systems: Review of Systems  Unable to perform ROS: Other  Constitutional:       This is a remote consultation so no review of systems was obtained.    Past Medical History:  Diagnosis Date  . Abdominal distension   . Anemia   . Anxiety   . B12 deficiency   . Balance problem   . Blood in stool   . Cancer (HCC)    OVARIAN  . Colon polyps   . Dementia ([place])    EARLY  . Dementia ([employer])    EARLY  . Depression   . GERD (gastroesophageal reflux disease)   . History of blood transfusion   . Hypertension   . Hypomagnesemia   . Urine incontinence     Social History   Tobacco Use  . Smoking status: Never Smoker  . Smokeless tobacco: Never Used  Substance Use Topics  . Alcohol use: No  . Drug use: No    Family History  Family history unknown: Yes   No Known Allergies  OBJECTIVE: Blood pressure 136/81, pulse 88,  temperature 98 F (36.7 C), temperature source Oral, resp. rate 18, height 5' 4.5" (1.638 m), weight 92 kg, SpO2 99 %.  Physical Exam Constitutional:      Comments: This is a remote consultation so no exam was performed.     Lab Results Lab Results  Component Value Date   WBC 15.1 (H) 04/21/2018   HGB 13.3 04/21/2018   HCT 39.1 04/21/2018   MCV 80.8 04/21/2018   PLT 369 04/21/2018    Lab Results  Component Value Date   CREATININE 1.07 (H) 04/21/2018   BUN 14 04/21/2018   NA 136 04/21/2018   K 3.4 (L) 04/21/2018   CL 104 04/21/2018   CO2 21 (L) 04/21/2018    Lab Results  Component Value Date   ALT 18 04/20/2018   AST 18 04/20/2018   ALKPHOS 115 04/20/2018   BILITOT 0.9 04/20/2018      Microbiology: Recent Results (from the past 240 hour(s))  Blood culture (routine x 2)     Status: None (Preliminary result)   Collection Time: 04/20/18  7:49 PM  Result Value Ref Range Status   Specimen Description   Final    BLOOD LEFT FOREARM Performed at [facility], [place] [address], [place], [place] [address]    Special Requests   Final    BOTTLES DRAWN AEROBIC AND ANAEROBIC Blood Culture results may not be optimal due to an excessive volume of blood received in culture bottles Performed at [place] [address], [place], [place] [address]    Culture   Final    NO GROWTH < 12 HOURS Performed at [place] [address], [place], [place] [address]    Report Status PENDING  Incomplete  Wound or Superficial Culture     Status: None (Preliminary result)   Collection Time: 04/20/18  7:50 PM  Result Value Ref Range Status   Specimen Description   Final    ABSCESS Performed at [facility] [address] [place], [place] [address]    Special Requests   Final    Immunocompromised Performed at [facility], [place] [address], [place], [place] [address]    Gram Stain   Final    FEW WBC PRESENT, PREDOMINANTLY PMN MODERATE GRAM POSITIVE RODS FEW GRAM POSITIVE COCCI IN CHAINS RARE GRAM NEGATIVE RODS Performed at [facility], [place] [address], [place], [place] [address]    Culture PENDING  Incomplete   Report Status PENDING  Incomplete  Blood culture (routine x 2)     Status: None (Preliminary result)   Collection Time: 04/20/18  7:54 PM  Result Value Ref Range Status   Specimen Description   Final    BLOOD RIGHT FOREARM Performed at [facility], [place] [address], [place], [place] [address]    Special Requests   Final    BOTTLES DRAWN AEROBIC AND ANAEROBIC Blood Culture adequate volume Performed at [facility] [address], [place], [place] [address]    Culture   Setup Time   Final    GRAM POSITIVE COCCI IN BOTH AEROBIC AND ANAEROBIC BOTTLES CRITICAL RESULT CALLED TO, READ BACK BY AND VERIFIED WITHDiona Browner PHARMD 1645 04/21/18 A BROWNING Performed at [facility], [place] [address], [place], [place] [address]    Culture [facility] POSITIVE COCCI  Final   Report Status PENDING  Incomplete  Blood Culture ID Panel (Reflexed)  Status: Abnormal   Collection Time: 04/20/18  7:54 PM  Result Value Ref Range Status   Enterococcus species NOT DETECTED NOT DETECTED Final   Listeria monocytogenes NOT DETECTED NOT DETECTED Final   Staphylococcus species DETECTED (A) NOT DETECTED Final    Comment: CRITICAL RESULT CALLED TO, READ BACK BY AND VERIFIED WITH: T DANG PHARMD 1645 04/21/18 A BROWNING    Staphylococcus aureus (BCID) DETECTED (A) NOT DETECTED Final    Comment: Methicillin (oxacillin) susceptible Staphylococcus aureus (MSSA). Preferred therapy is anti staphylococcal beta lactam antibiotic (Cefazolin or Nafcillin), unless clinically contraindicated. CRITICAL RESULT CALLED TO, READ BACK BY AND VERIFIED WITH: T DANG PHARMD 1645 04/21/18 A BROWNING    Methicillin resistance NOT DETECTED NOT DETECTED Final   Streptococcus species NOT DETECTED NOT DETECTED Final   Streptococcus agalactiae NOT DETECTED NOT DETECTED Final   Streptococcus pneumoniae NOT DETECTED NOT DETECTED Final   Streptococcus pyogenes NOT DETECTED NOT DETECTED Final   Acinetobacter baumannii NOT DETECTED NOT DETECTED Final   Enterobacteriaceae species NOT DETECTED NOT DETECTED Final   Enterobacter cloacae complex NOT DETECTED NOT DETECTED Final   Escherichia coli NOT DETECTED NOT DETECTED Final   Klebsiella oxytoca NOT DETECTED NOT DETECTED Final   Klebsiella pneumoniae NOT DETECTED NOT DETECTED Final   Proteus species NOT DETECTED NOT DETECTED Final   Serratia marcescens NOT DETECTED NOT DETECTED Final   Haemophilus influenzae NOT DETECTED NOT DETECTED Final   Neisseria meningitidis NOT  DETECTED NOT DETECTED Final   Pseudomonas aeruginosa NOT DETECTED NOT DETECTED Final   Candida albicans NOT DETECTED NOT DETECTED Final   Candida glabrata NOT DETECTED NOT DETECTED Final   Candida krusei NOT DETECTED NOT DETECTED Final   Candida parapsilosis NOT DETECTED NOT DETECTED Final   Candida tropicalis NOT DETECTED NOT DETECTED Final    Comment: Performed at [facility], [place]. [address] [place], [place] [address]    Michel Bickers, [place] [facility] [place] [facility] [phone] pager   [phone] cell 04/21/2018, 5:21 PM

## 2018-04-22 ENCOUNTER — Other Ambulatory Visit: Payer: Self-pay

## 2018-04-22 ENCOUNTER — Inpatient Hospital Stay (HOSPITAL_COMMUNITY): Payer: Medicare Other | Admitting: Certified Registered Nurse Anesthetist

## 2018-04-22 ENCOUNTER — Inpatient Hospital Stay (HOSPITAL_COMMUNITY): Payer: Medicare Other

## 2018-04-22 ENCOUNTER — Encounter (HOSPITAL_COMMUNITY)
Admission: EM | Disposition: A | Discharge status: Skilled Nursing Facility | Payer: Self-pay | Source: Home / Self Care | Attending: Internal Medicine

## 2018-04-22 DIAGNOSIS — L02611 Cutaneous abscess of right foot: Secondary | ICD-10-CM

## 2018-04-22 DIAGNOSIS — B9561 Methicillin susceptible Staphylococcus aureus infection as the cause of diseases classified elsewhere: Secondary | ICD-10-CM

## 2018-04-22 DIAGNOSIS — Z978 Presence of other specified devices: Secondary | ICD-10-CM

## 2018-04-22 DIAGNOSIS — S90851S Superficial foreign body, right foot, sequela: Secondary | ICD-10-CM

## 2018-04-22 DIAGNOSIS — R7881 Bacteremia: Secondary | ICD-10-CM

## 2018-04-22 DIAGNOSIS — I10 Essential (primary) hypertension: Secondary | ICD-10-CM

## 2018-04-22 DIAGNOSIS — N179 Acute kidney failure, unspecified: Secondary | ICD-10-CM

## 2018-04-22 HISTORY — PX: I&D EXTREMITY: SHX5045

## 2018-04-22 LAB — BASIC METABOLIC PANEL
Anion gap: 10 (ref 5–15)
BUN: 13 mg/dL (ref 8–23)
CO2: 23 mmol/L (ref 22–32)
Calcium: 8.4 mg/dL — ABNORMAL LOW (ref 8.9–10.3)
Chloride: 104 mmol/L (ref 98–111)
Creatinine, Ser: 1.45 mg/dL — ABNORMAL HIGH (ref 0.44–1.00)
GFR calc Af Amer: 40 mL/min — ABNORMAL LOW (ref >60)
GFR calc non Af Amer: 35 mL/min — ABNORMAL LOW (ref >60)
Glucose, Bld: 258 mg/dL — ABNORMAL HIGH (ref 70–99)
Potassium: 3.5 mmol/L (ref 3.5–5.1)
Sodium: 137 mmol/L (ref 135–145)

## 2018-04-22 LAB — CBC WITH DIFFERENTIAL/PLATELET
ABS IMMATURE GRANULOCYTES: 0.07 10*3/uL (ref 0.00–0.07)
BASOS PCT: 0 %
Basophils Absolute: 0.1 10*3/uL (ref 0.0–0.1)
EOS ABS: 0.5 10*3/uL (ref 0.0–0.5)
Eosinophils Relative: 4 %
HCT: 35.9 % — ABNORMAL LOW (ref 36.0–46.0)
Hemoglobin: 12.9 g/dL (ref 12.0–15.0)
Immature Granulocytes: 1 %
Lymphocytes Relative: 12 %
Lymphs Abs: 1.5 10*3/uL (ref 0.7–4.0)
MCH: 27.9 pg (ref 26.0–34.0)
MCHC: 35.9 g/dL (ref 30.0–36.0)
MCV: 77.5 fL — ABNORMAL LOW (ref 80.0–100.0)
Monocytes Absolute: 1 10*3/uL (ref 0.1–1.0)
Monocytes Relative: 8 %
NEUTROS ABS: 9.4 10*3/uL — AB (ref 1.7–7.7)
NEUTROS PCT: 75 %
Platelets: 364 10*3/uL (ref 150–400)
RBC: 4.63 MIL/uL (ref 3.87–5.11)
RDW: 13.2 % (ref 11.5–15.5)
WBC: 12.5 10*3/uL — ABNORMAL HIGH (ref 4.0–10.5)
nRBC: 0 % (ref 0.0–0.2)

## 2018-04-22 LAB — IRON,TIBC AND FERRITIN PANEL
%SAT: 9 % (calc) — ABNORMAL LOW (ref 16–45)
Ferritin: 194 ng/mL (ref 16–288)
Iron: 23 ug/dL — ABNORMAL LOW (ref 45–160)
TIBC: 249 mcg/dL (calc) — ABNORMAL LOW (ref 250–450)

## 2018-04-22 LAB — GLUCOSE, CAPILLARY
GLUCOSE-CAPILLARY: 188 mg/dL — AB (ref 70–99)
Glucose-Capillary: 205 mg/dL — ABNORMAL HIGH (ref 70–99)
Glucose-Capillary: 161 mg/dL — ABNORMAL HIGH (ref 70–99)
Glucose-Capillary: 224 mg/dL — ABNORMAL HIGH (ref 70–99)
Glucose-Capillary: 241 mg/dL — ABNORMAL HIGH (ref 70–99)

## 2018-04-22 LAB — ECHOCARDIOGRAM LIMITED
Height: 64.5 in
Weight: 3245.17 oz

## 2018-04-22 LAB — URINALYSIS W MICROSCOPIC + REFLEX CULTURE

## 2018-04-22 LAB — MAGNESIUM: Magnesium: 1.7 mg/dL (ref 1.7–2.4)

## 2018-04-22 SURGERY — IRRIGATION AND DEBRIDEMENT EXTREMITY
Anesthesia: General | Site: Foot | Laterality: Right

## 2018-04-22 MED ORDER — SODIUM CHLORIDE 0.9 % IV SOLN
INTRAVENOUS | Status: DC
  Administered 2018-04-22: 13:00:00 via INTRAVENOUS

## 2018-04-22 MED ORDER — PROPOFOL 10 MG/ML IV BOLUS
INTRAVENOUS | Status: DC | PRN
  Administered 2018-04-22: 150 mg via INTRAVENOUS

## 2018-04-22 MED ORDER — PROMETHAZINE HCL 25 MG/ML IJ SOLN: 6.2500 mg | INTRAMUSCULAR | Status: DC | PRN

## 2018-04-22 MED ORDER — AMLODIPINE BESYLATE 5 MG PO TABS
5.0000 mg | ORAL_TABLET | Freq: Two times a day (BID) | ORAL | Status: DC
  Administered 2018-04-22 – 2018-04-26 (×8): 5 mg via ORAL
  Filled 2018-04-22 (×8): qty 1

## 2018-04-22 MED ORDER — FENTANYL CITRATE (PF) 100 MCG/2ML IJ SOLN
25.0000 ug | INTRAMUSCULAR | Status: DC | PRN
  Administered 2018-04-22 (×2): 50 ug via INTRAVENOUS

## 2018-04-22 MED ORDER — FENTANYL CITRATE (PF) 250 MCG/5ML IJ SOLN
INTRAMUSCULAR | Status: DC | PRN
  Administered 2018-04-22 (×2): 25 ug via INTRAVENOUS

## 2018-04-22 MED ORDER — FENTANYL CITRATE (PF) 100 MCG/2ML IJ SOLN
INTRAMUSCULAR | Status: AC
  Administered 2018-04-22: 50 ug via INTRAVENOUS
  Filled 2018-04-22: qty 2

## 2018-04-22 MED ORDER — LACTATED RINGERS IV SOLN
INTRAVENOUS | Status: DC
  Administered 2018-04-22: 09:00:00 via INTRAVENOUS

## 2018-04-22 MED ORDER — METHOCARBAMOL 500 MG PO TABS
500.0000 mg | ORAL_TABLET | Freq: Four times a day (QID) | ORAL | Status: DC | PRN
  Administered 2018-04-22 – 2018-04-25 (×3): 500 mg via ORAL
  Filled 2018-04-22 (×3): qty 1

## 2018-04-22 MED ORDER — POLYETHYLENE GLYCOL 3350 17 G PO PACK: 17.0000 g | PACK | Freq: Every day | ORAL | Status: DC | PRN

## 2018-04-22 MED ORDER — DEXAMETHASONE SODIUM PHOSPHATE 10 MG/ML IJ SOLN
INTRAMUSCULAR | Status: AC
  Filled 2018-04-22: qty 1

## 2018-04-22 MED ORDER — LIDOCAINE HCL (CARDIAC) PF 100 MG/5ML IV SOSY
PREFILLED_SYRINGE | INTRAVENOUS | Status: DC | PRN
  Administered 2018-04-22: 100 mg via INTRATRACHEAL

## 2018-04-22 MED ORDER — OXYCODONE HCL 5 MG PO TABS
5.0000 mg | ORAL_TABLET | ORAL | Status: DC | PRN
  Administered 2018-04-22 (×2): 10 mg via ORAL
  Administered 2018-04-23 (×3): 5 mg via ORAL
  Administered 2018-04-24 – 2018-04-26 (×4): 10 mg via ORAL
  Filled 2018-04-22: qty 1
  Filled 2018-04-22 (×3): qty 2
  Filled 2018-04-22: qty 1
  Filled 2018-04-22 (×2): qty 2
  Filled 2018-04-22: qty 1

## 2018-04-22 MED ORDER — LACTATED RINGERS IV SOLN
INTRAVENOUS | Status: DC | PRN
  Administered 2018-04-22: 09:00:00 via INTRAVENOUS

## 2018-04-22 MED ORDER — DEXAMETHASONE SODIUM PHOSPHATE 10 MG/ML IJ SOLN
INTRAMUSCULAR | Status: DC | PRN
  Administered 2018-04-22: 5 mg via INTRAVENOUS

## 2018-04-22 MED ORDER — MIDAZOLAM HCL 2 MG/2ML IJ SOLN
INTRAMUSCULAR | Status: DC | PRN
  Administered 2018-04-22: 2 mg via INTRAVENOUS

## 2018-04-22 MED ORDER — INSULIN ASPART 100 UNIT/ML ~~LOC~~ SOLN
0.0000 [IU] | Freq: Three times a day (TID) | SUBCUTANEOUS | Status: DC
  Administered 2018-04-22: 3 [IU] via SUBCUTANEOUS
  Administered 2018-04-23: 1 [IU] via SUBCUTANEOUS
  Administered 2018-04-23 (×2): 2 [IU] via SUBCUTANEOUS
  Administered 2018-04-24: 1 [IU] via SUBCUTANEOUS
  Administered 2018-04-25 – 2018-04-26 (×3): 2 [IU] via SUBCUTANEOUS

## 2018-04-22 MED ORDER — OXYCODONE HCL 5 MG PO TABS
10.0000 mg | ORAL_TABLET | ORAL | Status: DC | PRN
  Administered 2018-04-23 – 2018-04-25 (×2): 10 mg via ORAL
  Filled 2018-04-22 (×2): qty 2

## 2018-04-22 MED ORDER — CHLORHEXIDINE GLUCONATE 4 % EX LIQD: 60.0000 mL | Freq: Once | CUTANEOUS | Status: DC

## 2018-04-22 MED ORDER — MIDAZOLAM HCL 2 MG/2ML IJ SOLN
INTRAMUSCULAR | Status: AC
  Filled 2018-04-22: qty 2

## 2018-04-22 MED ORDER — ACETAMINOPHEN 325 MG PO TABS
325.0000 mg | ORAL_TABLET | Freq: Four times a day (QID) | ORAL | Status: DC | PRN
  Administered 2018-04-22 – 2018-04-24 (×4): 650 mg via ORAL
  Filled 2018-04-22 (×4): qty 2

## 2018-04-22 MED ORDER — MEPERIDINE HCL 50 MG/ML IJ SOLN: 6.2500 mg | INTRAMUSCULAR | Status: DC | PRN

## 2018-04-22 MED ORDER — ONDANSETRON HCL 4 MG PO TABS: 4.0000 mg | ORAL_TABLET | Freq: Four times a day (QID) | ORAL | Status: DC | PRN

## 2018-04-22 MED ORDER — INSULIN ASPART 100 UNIT/ML ~~LOC~~ SOLN
0.0000 [IU] | Freq: Every day | SUBCUTANEOUS | Status: DC
  Administered 2018-04-25: 4 [IU] via SUBCUTANEOUS

## 2018-04-22 MED ORDER — BISACODYL 10 MG RE SUPP: 10.0000 mg | Freq: Every day | RECTAL | Status: DC | PRN

## 2018-04-22 MED ORDER — INSULIN ASPART 100 UNIT/ML ~~LOC~~ SOLN
3.0000 [IU] | Freq: Three times a day (TID) | SUBCUTANEOUS | Status: DC
  Administered 2018-04-22 – 2018-04-26 (×9): 3 [IU] via SUBCUTANEOUS

## 2018-04-22 MED ORDER — METOCLOPRAMIDE HCL 5 MG PO TABS: 5.0000 mg | ORAL_TABLET | Freq: Three times a day (TID) | ORAL | Status: DC | PRN

## 2018-04-22 MED ORDER — METHOCARBAMOL 1000 MG/10ML IJ SOLN
500.0000 mg | Freq: Four times a day (QID) | INTRAVENOUS | Status: DC | PRN
  Filled 2018-04-22: qty 5

## 2018-04-22 MED ORDER — OXYCODONE HCL 5 MG PO TABS: 5.0000 mg | ORAL_TABLET | Freq: Once | ORAL | Status: DC | PRN

## 2018-04-22 MED ORDER — METOCLOPRAMIDE HCL 5 MG/ML IJ SOLN: 5.0000 mg | Freq: Three times a day (TID) | INTRAMUSCULAR | Status: DC | PRN

## 2018-04-22 MED ORDER — ONDANSETRON HCL 4 MG/2ML IJ SOLN
INTRAMUSCULAR | Status: DC | PRN
  Administered 2018-04-22: 4 mg via INTRAVENOUS

## 2018-04-22 MED ORDER — ONDANSETRON HCL 4 MG/2ML IJ SOLN
INTRAMUSCULAR | Status: AC
  Filled 2018-04-22: qty 2

## 2018-04-22 MED ORDER — FENTANYL CITRATE (PF) 250 MCG/5ML IJ SOLN
INTRAMUSCULAR | Status: AC
  Filled 2018-04-22: qty 5

## 2018-04-22 MED ORDER — DOCUSATE SODIUM 100 MG PO CAPS
100.0000 mg | ORAL_CAPSULE | Freq: Two times a day (BID) | ORAL | Status: DC
  Administered 2018-04-23 – 2018-04-26 (×6): 100 mg via ORAL
  Filled 2018-04-22 (×8): qty 1

## 2018-04-22 MED ORDER — ONDANSETRON HCL 4 MG/2ML IJ SOLN: 4.0000 mg | Freq: Four times a day (QID) | INTRAMUSCULAR | Status: DC | PRN

## 2018-04-22 MED ORDER — SODIUM CHLORIDE 0.9 % IR SOLN
Status: DC | PRN
  Administered 2018-04-22: 3000 mL
  Administered 2018-04-22: 500 mL

## 2018-04-22 MED ORDER — INSULIN GLARGINE 100 UNIT/ML ~~LOC~~ SOLN
20.0000 [IU] | Freq: Every day | SUBCUTANEOUS | Status: DC
  Administered 2018-04-22 – 2018-04-25 (×4): 20 [IU] via SUBCUTANEOUS
  Filled 2018-04-22 (×4): qty 0.2

## 2018-04-22 MED ORDER — OXYCODONE HCL 5 MG/5ML PO SOLN: 5.0000 mg | Freq: Once | ORAL | Status: DC | PRN

## 2018-04-22 MED ORDER — 0.9 % SODIUM CHLORIDE (POUR BTL) OPTIME
TOPICAL | Status: DC | PRN
  Administered 2018-04-22: 1000 mL

## 2018-04-22 MED ORDER — LIDOCAINE 2% (20 MG/ML) 5 ML SYRINGE
INTRAMUSCULAR | Status: AC
  Filled 2018-04-22: qty 5

## 2018-04-22 MED ORDER — HYDROMORPHONE HCL 1 MG/ML IJ SOLN
0.5000 mg | INTRAMUSCULAR | Status: DC | PRN
  Administered 2018-04-25: 1 mg via INTRAVENOUS
  Filled 2018-04-22: qty 1

## 2018-04-22 MED ORDER — SODIUM CHLORIDE 0.45 % IV SOLN
INTRAVENOUS | Status: DC
  Administered 2018-04-22 – 2018-04-23 (×2): via INTRAVENOUS

## 2018-04-22 MED ORDER — MAGNESIUM CITRATE PO SOLN: 1.0000 | Freq: Once | ORAL | Status: DC | PRN

## 2018-04-22 SURGICAL SUPPLY — 37 items
BLADE SURG 21 STRL SS (BLADE) ×3 IMPLANT
BNDG COHESIVE 6X5 TAN STRL LF (GAUZE/BANDAGES/DRESSINGS) IMPLANT
BNDG GAUZE ELAST 4 BULKY (GAUZE/BANDAGES/DRESSINGS) ×6 IMPLANT
CANISTER WOUND CARE 500ML ATS (WOUND CARE) ×2 IMPLANT
CASSETTE VERAFLO VERALINK (MISCELLANEOUS) ×2 IMPLANT
COVER SURGICAL LIGHT HANDLE (MISCELLANEOUS) ×6 IMPLANT
COVER WAND RF STERILE (DRAPES) ×3 IMPLANT
DRAPE U-SHAPE 47X51 STRL (DRAPES) ×3 IMPLANT
DRESSING VERAFLO CLEANSE CC (GAUZE/BANDAGES/DRESSINGS) IMPLANT
DRSG ADAPTIC 3X8 NADH LF (GAUZE/BANDAGES/DRESSINGS) ×3 IMPLANT
DRSG VERAFLO CLEANSE CC (GAUZE/BANDAGES/DRESSINGS) ×3
DURAPREP 26ML APPLICATOR (WOUND CARE) ×3 IMPLANT
ELECT REM PT RETURN 9FT ADLT (ELECTROSURGICAL)
ELECTRODE REM PT RTRN 9FT ADLT (ELECTROSURGICAL) IMPLANT
GAUZE SPONGE 4X4 12PLY STRL (GAUZE/BANDAGES/DRESSINGS) ×3 IMPLANT
GLOVE BIOGEL PI IND STRL 9 (GLOVE) ×1 IMPLANT
GLOVE BIOGEL PI INDICATOR 9 (GLOVE) ×2
GLOVE SURG ORTHO 9.0 STRL STRW (GLOVE) ×3 IMPLANT
GOWN STRL REUS W/ TWL XL LVL3 (GOWN DISPOSABLE) ×2 IMPLANT
GOWN STRL REUS W/TWL XL LVL3 (GOWN DISPOSABLE) ×6
HANDPIECE INTERPULSE COAX TIP (DISPOSABLE)
KIT BASIN OR (CUSTOM PROCEDURE TRAY) ×3 IMPLANT
KIT DRSG PREVENA PLUS 7DAY 125 (MISCELLANEOUS) ×2 IMPLANT
KIT TURNOVER KIT B (KITS) ×3 IMPLANT
MANIFOLD NEPTUNE II (INSTRUMENTS) ×3 IMPLANT
NS IRRIG 1000ML POUR BTL (IV SOLUTION) ×3 IMPLANT
PACK ORTHO EXTREMITY (CUSTOM PROCEDURE TRAY) ×3 IMPLANT
PAD ARMBOARD 7.5X6 YLW CONV (MISCELLANEOUS) ×6 IMPLANT
SET HNDPC FAN SPRY TIP SCT (DISPOSABLE) IMPLANT
STOCKINETTE IMPERVIOUS 9X36 MD (GAUZE/BANDAGES/DRESSINGS) IMPLANT
SUT ETHILON 2 0 PSLX (SUTURE) ×4 IMPLANT
SWAB COLLECTION DEVICE MRSA (MISCELLANEOUS) ×3 IMPLANT
SWAB CULTURE ESWAB REG 1ML (MISCELLANEOUS) IMPLANT
TOWEL OR 17X26 10 PK STRL BLUE (TOWEL DISPOSABLE) ×3 IMPLANT
TUBE CONNECTING 12'X1/4 (SUCTIONS) ×1
TUBE CONNECTING 12X1/4 (SUCTIONS) ×2 IMPLANT
YANKAUER SUCT BULB TIP NO VENT (SUCTIONS) ×3 IMPLANT

## 2018-04-22 NOTE — Anesthesia Preprocedure Evaluation (Addendum)
Anesthesia Evaluation  Patient identified by MRN, date of birth, ID band Patient awake    Reviewed: Allergy & Precautions, NPO status , Patient's Chart, lab work & pertinent test results, reviewed documented beta blocker date and time   Airway Mallampati: III  TM Distance: >3 FB     Dental  (+) Chipped   Pulmonary     + decreased breath sounds      Cardiovascular hypertension,  Rhythm:Regular     Neuro/Psych  Headaches, PSYCHIATRIC DISORDERS Anxiety Depression Dementia    GI/Hepatic GERD  Controlled,  Endo/Other  diabetes  Renal/GU      Musculoskeletal   Abdominal   Peds  Hematology  (+) anemia ,   Anesthesia Other Findings   Reproductive/Obstetrics                            Anesthesia Physical  Anesthesia Plan  ASA: III  Anesthesia Plan: General   Post-op Pain Management:    Induction: Intravenous  PONV Risk Score and Plan: 4 or greater and Ondansetron, Dexamethasone and Treatment may vary due to age or medical condition  Airway Management Planned: LMA  Additional Equipment: None  Intra-op Plan:   Post-operative Plan: Extubation in OR  Informed Consent: I have reviewed the patients History and Physical, chart, labs and discussed the procedure including the risks, benefits and alternatives for the proposed anesthesia with the patient or authorized representative who has indicated his/her understanding and acceptance.     Dental advisory given  Plan Discussed with: CRNA  Anesthesia Plan Comments:         Anesthesia Quick Evaluation

## 2018-04-22 NOTE — Progress Notes (Signed)
Patient ID: [name], female   DOB: Jun 22, 1940, 78 y.o.   MRN: [id number]         [facility]  Date of Admission:  04/20/2018   Total days of antibiotics 2        Day 1 ampicillin sulbactam         ASSESSMENT: She has a polymicrobial soft tissue infection of her right foot complicated by transient MSSA bacteremia.  PLAN: 1. Continue ampicillin sulbactam pending final cultures 2. Hold off on empiric placement until we know blood cultures have cleared 3. 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 docusate sodium  100 mg Oral BID  . insulin aspart  0-15 Units Subcutaneous TID WC  . insulin glargine  15 Units Subcutaneous QHS  . senna-docusate  1 tablet Oral QHS   Continuous Infusions: . sodium chloride    . ampicillin-sulbactam (UNASYN) IV 3 g (04/22/18 0146)  . lactated ringers 10 mL/hr at 04/22/18 0840  . methocarbamol (ROBAXIN) IV    . sodium chloride     PRN Meds:.[START ON 04/23/2018] acetaminophen, bisacodyl, HYDROmorphone (DILAUDID) injection, magnesium citrate, methocarbamol **OR** methocarbamol (ROBAXIN) IV, metoCLOPramide **OR** metoCLOPramide (REGLAN) injection, morphine injection, ondansetron **OR** ondansetron (ZOFRAN) IV, oxyCODONE, oxyCODONE, polyethylene glycol   SUBJECTIVE: She had incision and drainage of a large plantar abscess that wrapped around the lateral aspect of her right foot.  The foreign body was removed.  Operative Gram stain is pending.  Gram stain of the specimen obtained 2 days ago after bedside I&D showed gram-positive rods, gram-positive cocci in chains and gram-negative rods.  1 of 2 admission blood cultures has grown MSSA.  Repeat blood cultures and TTE are pending.  She is having expected postoperative pain but is in  good spirits.  She is currently ordering lunch.  Review of Systems: Review of Systems  Constitutional: Negative for chills, diaphoresis and fever.  Musculoskeletal: Positive for joint pain.    No Known Allergies  OBJECTIVE: Vitals:   04/22/18 1011 04/22/18 1015 04/22/18 1021 04/22/18 1045  BP: (!) 148/81 (!) 148/81 (!) 151/73 (!) 148/87  Pulse: 87 86 86 91  Resp: 15 14 16 18   Temp:   (!) 97.2 F (36.2 C) 98.1 F (36.7 C)  TempSrc:    Oral  SpO2: 92% 93% 95% 94%  Weight:      Height:       Body mass index is 34.28 kg/m.  Physical Exam Constitutional:      Comments: She is alert, talkative and in good spirits.  Musculoskeletal:     Comments: Her right foot and lower leg are wrapped.  She has a drain in place.  Psychiatric:        Mood and Affect: Mood normal.     Lab Results Lab Results  Component Value Date   WBC 12.5 (H) 04/22/2018   HGB 12.9 04/22/2018   HCT 35.9 (L) 04/22/2018   MCV 77.5 (L) 04/22/2018   PLT 364 04/22/2018    Lab Results  Component Value Date   CREATININE 1.45 (H) 04/22/2018   BUN 13 04/22/2018   NA 137 04/22/2018   K 3.5 04/22/2018   CL 104 04/22/2018   CO2 23 04/22/2018    Lab Results  Component Value Date   ALT 18  04/20/2018   AST 18 04/20/2018   ALKPHOS 115 04/20/2018   BILITOT 0.9 04/20/2018     Microbiology: Recent Results (from the past 240 hour(s))  Blood culture (routine x 2)     Status: None (Preliminary result)   Collection Time: 04/20/18  7:49 PM  Result Value Ref Range Status   Specimen Description   Final    BLOOD LEFT FOREARM Performed at [facility], [place] [address], [place], [place] [address]    Special Requests   Final    BOTTLES DRAWN AEROBIC AND ANAEROBIC Blood Culture results may not be optimal due to an excessive volume of blood received in culture bottles Performed at [facility] [address], [place], [place] [address]    Culture   Final    NO GROWTH < 12 HOURS  Performed at [facility] [address], [place], [place] [address]    Report Status PENDING  Incomplete  Wound or Superficial Culture     Status: None (Preliminary result)   Collection Time: 04/20/18  7:50 PM  Result Value Ref Range Status   Specimen Description   Final    ABSCESS Performed at [facility] [address], [place], [place] [address]    Special Requests   Final    Immunocompromised Performed at [facility], [place] [address] [place], [place] [address]    Gram Stain   Final    FEW WBC PRESENT, PREDOMINANTLY PMN MODERATE GRAM POSITIVE RODS FEW GRAM POSITIVE COCCI IN CHAINS RARE GRAM NEGATIVE RODS    Culture   Final    CULTURE REINCUBATED FOR BETTER GROWTH Performed at [facility], [place] [address], [place], [place] [address]    Report Status PENDING  Incomplete  Blood culture (routine x 2)     Status: Abnormal (Preliminary result)   Collection Time: 04/20/18  7:54 PM  Result Value Ref Range Status   Specimen Description   Final    BLOOD RIGHT FOREARM Performed at [facility], [place] [address] [place], [place] [address]    Special Requests   Final    BOTTLES DRAWN AEROBIC AND ANAEROBIC Blood Culture adequate volume Performed at [facility] [address] [place], [place] [address]    Culture  Setup Time   Final    GRAM POSITIVE COCCI IN BOTH AEROBIC AND ANAEROBIC BOTTLES CRITICAL RESULT CALLED TO, READ BACK BY AND VERIFIED WITH: T DANG PHARMD 1645 04/21/18 A BROWNING    Culture (A)  Final    STAPHYLOCOCCUS AUREUS SUSCEPTIBILITIES TO FOLLOW Performed at [facility], [place] [address], [place],  [address]    Report Status PENDING  Incomplete  Blood Culture ID Panel (Reflexed)     Status: Abnormal   Collection Time: 04/20/18  7:54 PM  Result Value Ref Range Status   Enterococcus species NOT DETECTED NOT DETECTED Final   Listeria monocytogenes NOT DETECTED NOT DETECTED  Final   Staphylococcus species DETECTED (A) NOT DETECTED Final    Comment: CRITICAL RESULT CALLED TO, READ BACK BY AND VERIFIED WITH: T DANG PHARMD 1645 04/21/18 A BROWNING    Staphylococcus aureus (BCID) DETECTED (A) NOT DETECTED Final    Comment: Methicillin (oxacillin) susceptible Staphylococcus aureus (MSSA). Preferred therapy is anti staphylococcal beta lactam antibiotic (Cefazolin or Nafcillin), unless clinically contraindicated. CRITICAL RESULT CALLED TO, READ BACK BY AND VERIFIED WITH: T DANG PHARMD 1645 04/21/18 A BROWNING  Methicillin resistance NOT DETECTED NOT DETECTED Final   Streptococcus species NOT DETECTED NOT DETECTED Final   Streptococcus agalactiae NOT DETECTED NOT DETECTED Final   Streptococcus pneumoniae NOT DETECTED NOT DETECTED Final   Streptococcus pyogenes NOT DETECTED NOT DETECTED Final   Acinetobacter baumannii NOT DETECTED NOT DETECTED Final   Enterobacteriaceae species NOT DETECTED NOT DETECTED Final   Enterobacter cloacae complex NOT DETECTED NOT DETECTED Final   Escherichia coli NOT DETECTED NOT DETECTED Final   Klebsiella oxytoca NOT DETECTED NOT DETECTED Final   Klebsiella pneumoniae NOT DETECTED NOT DETECTED Final   Proteus species NOT DETECTED NOT DETECTED Final   Serratia marcescens NOT DETECTED NOT DETECTED Final   Haemophilus influenzae NOT DETECTED NOT DETECTED Final   Neisseria meningitidis NOT DETECTED NOT DETECTED Final   Pseudomonas aeruginosa NOT DETECTED NOT DETECTED Final   Candida albicans NOT DETECTED NOT DETECTED Final   Candida glabrata NOT DETECTED NOT DETECTED Final   Candida krusei NOT DETECTED NOT DETECTED Final   Candida parapsilosis NOT DETECTED NOT DETECTED Final   Candida tropicalis NOT DETECTED NOT DETECTED Final    Comment: Performed at [facility], [place] [address] [place], [place] [address]    Michel Bickers, [facility] [phone] pager   [phone]  cell 04/22/2018, 11:18 AM

## 2018-04-22 NOTE — Progress Notes (Signed)
The patient calls out frequently.  She speaks in a child like, high pitch matter.  She needs assistance in placing phone calls and ordering meals.  She has been encouraged numerous times to keep her operative foot elevated on a pillow.  Presently, she is sitting in the recliner by the bedside with the call light within reach.  Patient is reminded not to get up alone.

## 2018-04-22 NOTE — Transfer of Care (Signed)
Immediate Anesthesia Transfer of Care Note  Patient: [name]  Procedure(s) Performed: RIGHT FOOT DEBRIDEMENT AND FOREIGN BODY REMOVAL (Right Foot)  Patient Location: PACU  Anesthesia Type:General  Level of Consciousness: drowsy and patient cooperative  Airway & Oxygen Therapy: Patient Spontanous Breathing and Patient connected to nasal cannula oxygen  Post-op Assessment: Report given to RN and Post -op Vital signs reviewed and stable  Post vital signs: Reviewed and stable  Last Vitals:  Vitals Value Taken Time  BP 170/138 04/22/2018  9:41 AM  Temp    Pulse 109 04/22/2018  9:41 AM  Resp 24 04/22/2018  9:41 AM  SpO2 96 % 04/22/2018  9:41 AM  Vitals shown include unvalidated device data.  Last Pain:  Vitals:   04/22/18 0835  TempSrc:   PainSc: 0-No pain      Patients Stated Pain Goal: 0 (38/46/65 9935)  Complications: No apparent anesthesia complications

## 2018-04-22 NOTE — Anesthesia Postprocedure Evaluation (Signed)
Anesthesia Post Note  Patient: [name]  Procedure(s) Performed: RIGHT FOOT DEBRIDEMENT AND FOREIGN BODY REMOVAL (Right Foot)     Patient location during evaluation: PACU Anesthesia Type: General Level of consciousness: sedated and patient cooperative Pain management: pain level controlled Vital Signs Assessment: post-procedure vital signs reviewed and stable Respiratory status: spontaneous breathing Cardiovascular status: stable Anesthetic complications: no    Last Vitals:  Vitals:   04/22/18 1015 04/22/18 1021  BP: (!) 148/81 (!) 151/73  Pulse: 86 86  Resp: 14 16  Temp:  (!) 36.2 C  SpO2: 93% 95%    Last Pain:  Vitals:   04/22/18 0835  TempSrc:   PainSc: 0-No pain                 Nolon Nations

## 2018-04-22 NOTE — Progress Notes (Signed)
Orthopedic Tech Progress Note Patient Details:  [name] [birth year] [id number]  Ortho Devices Type of Ortho Device: Postop shoe/boot Ortho Device/Splint Interventions: Ordered, Application, Adjustment   Post Interventions Patient Tolerated: Well Instructions Provided: Adjustment of device, Care of device   Arlett Goold J Drago Hammonds 04/22/2018, 11:59 AM

## 2018-04-22 NOTE — Plan of Care (Signed)
Pt with intermittent R foot pain rated 8/10. F/u pt was asleep. Pt with PW, but still has occasional episodes of incontinence.

## 2018-04-22 NOTE — Progress Notes (Signed)
PT Cancellation Note  Patient Details Name: [name] MRN: [id number] DOB: 1940/12/16   Cancelled Treatment:    Reason Eval/Treat Not Completed: Patient not medically ready(OR today for R foot). PT will check back for appropriateness.    Leighton Roach, [place]  Pager [phone] Office [place] 04/22/2018, 8:49 AM

## 2018-04-22 NOTE — Progress Notes (Signed)
OT Cancellation Note  Patient Details Name: [name] MRN: [id number] DOB: December 16, 1940   Cancelled Treatment:    Reason Eval/Treat Not Completed: Patient not medically ready(OR Today for R foot ) Pt ready for OR at this time. OT will check back at most appropriate time.   Richelle Ito, OTR/L  Acute Rehabilitation Services Pager: [phone] Office: [phone] .  04/22/2018, 8:14 AM

## 2018-04-22 NOTE — Anesthesia Procedure Notes (Signed)
Procedure Name: LMA Insertion Date/Time: 04/22/2018 8:54 AM Performed by: Kathryne Hitch, CRNA Pre-anesthesia Checklist: Emergency Drugs available, Suction available, Patient identified, Patient being monitored and Timeout performed Patient Re-evaluated:Patient Re-evaluated prior to induction Oxygen Delivery Method: Circle system utilized Preoxygenation: Pre-oxygenation with 100% oxygen Induction Type: IV induction Ventilation: Mask ventilation without difficulty LMA: LMA inserted LMA Size: 4.0 Placement Confirmation: breath sounds checked- equal and bilateral Tube secured with: Tape Dental Injury: Teeth and Oropharynx as per pre-operative assessment

## 2018-04-22 NOTE — Progress Notes (Signed)
PROGRESS NOTE  [name] OJJ:[id number] DOB: 13-Mar-1940 DOA: 04/20/2018 PCP: Flossie Buffy, NP   LOS: 2 days   Brief Narrative / Interim history: [name] is a 78 y.o. female with hx of, GERD, ovarian cancer s/p tumor resection in 2017, and uncontrolled DMT2 on insulin (currently not taking insulin) who presented with worsening R foot pain and swelling x 1 week. Patient reports that she accidentally stepped on broken glass about 2 weeks ago. Since then, noticed pain with weight bearing on the R foot and swelling of the foot and ankle. She was seen by outpatient podiatry on 3/19 where an XR showed soft tissue swelling and possible 62mm foreign object in the superficial R plantar soft tissue.  She saw podiatry as an outpatient on 3/19 and plans were for patient to get an IND, received the block however did not wait for the doctor to come back, claims like she was never told how long she would have to wait and got upset for not being communicated with appropriately and left.  She was advised to go to the ED at that time but patient's husband brought her home instead.  Came back to the ER on 3/22 with worsening right foot pain and swelling.  In the ED had an I&D, however follow-up x-ray showed persistent foreign body in her right foot, slightly deeper  Subjective: She has no chest pain, no shortness of breath, denies any abdominal pain, nausea or vomiting.  She complains of right foot pain  Assessment & Plan: Principal Problem:   Bacteremia due to methicillin susceptible Staphylococcus aureus (MSSA) Active Problems:   Foreign body in right foot   Diabetic foot infection ([place])   Abscess of foot   Hospital Problems  # Right plantar diabetic foot infection: after injury from glass 2 weeks ago with sepsis physiology, was tachycardic, leukocytosis in the ED, they did bedside I&D in the ED on 3/22, but follow-up x-ray showed persistent foreign body so pt admitted and  ortho was consulted - ortho took patient to OR today 3/24 for R foot debridement and foreign body removal. See full OP note below.  - seen by ID today, w/ diagnosis of polymicrobial diabetic foot infection w/ transient MSSA bacteremia > they recommend cont Unasyn, wait on PICC placement until blood cx's have cleared, and await results of TTE   # Poorly controlled type 2 diabetes mellitus with hyperglycemia -Most recent hemoglobin A1c 13.5 on 04/17/2018.  Sugars in the 400s on admission. Is on lantus insulin, 75/25 and sitagliptin-metformin at home (?) per PTA med list.  - here is getting Lantus 15 units along with moderate sliding scale - BS"s still up a bit in low 200's > will ^ lantus 20 u qd - cont moderate SSI, add 3 u meal coverage  # Home safety concerns  - PT and OT eval  # Hypokalemia K 2.8 - s/p K 40 mEq repletion in ED, potassium improved to 3.4 this morning -Continue to monitor  # Chronic HTN with labile BP on admission (SBP 90-160s) - BP's up slightly, creat up as well - hold home lisinopril for now > started norvasc 5 bid  # AKI - creat bump to 1.5 today, no contrast or hypotension, no nsaids.  - start IVF's and repeat in am   DVT prophylaxis: SCDs Code Status: Full code Family Communication: no family at bedside  Disposition Plan: not ready yet for [facility] [phone] 04/22/2018, 4:13 PM  Consultants:   Orthopedic surgery   Antimicrobials:  Vancomycin 3/22  Ceftriaxone 3/22  Metronidazole 3/23  Procedures:   04/22/2018 9:39 AM PATIENT:  [name]   PRE-OPERATIVE DIAGNOSIS:  Abscess and Foreign Body Right Foot POST-OPERATIVE DIAGNOSIS:  Same PROCEDURE:  RIGHT FOOT DEBRIDEMENT AND FOREIGN BODY REMOVAL Local tissue rearrangement for wound closure 120 x 40 mm. Application of installation wound VAC with the reticulated foam and solid foam. Tissue cultures obtained. SURGEON:  Newt Minion, MD PHYSICIAN ASSISTANT:None  ANESTHESIA:   General PREOPERATIVE INDICATIONS:  [name] is a  77 y.o. female with a diagnosis of Abscess and Foreign Body Right Foot who failed conservative measures and elected for surgical management.   The risks benefits and alternatives were discussed with the patient preoperatively including but not limited to the risks of infection, bleeding, nerve injury, cardiopulmonary complications, the need for revision surgery, among others, and the patient was willing to proceed. OPERATIVE IMPLANTS: Cleanse choice wound VAC foam 2 pieces. @ENCIMAGES @ OPERATIVE FINDINGS: Patient had a large abscess and necrotic tissue that extended across the entire plantar aspect of her foot and extended dorsally over the lateral aspect of her foot.  Tissue was sent for cultures.  OPERATIVE PROCEDURE: Patient was brought the operating room and underwent a general anesthetic.  After adequate levels anesthesia were obtained patient's right lower extremity was prepped using ChloraPrep draped in the sterile field a timeout was called.  A elliptical incision was made over the lateral aspect of the foot to encompass all the necrotic tissue.  Tissue was sent for cultures this left a wound that was 40 x 120 mm.  There was extensive necrotic tissue that stented across the plantar aspect of the foot a rondure was used to remove this necrotic tissue on the plantar aspect that was superficial to the plantar muscles.  Foreign body was removed with the soft tissue necrotic tissue.  The extensor tendons that were involved over the fifth metatarsal were excised.  There was abscess that extended dorsally over the foot and this was debrided and a pulse lavage was used to irrigate the wound including both the dorsal and the plantar wounds.  This was debrided back to healthy viable muscle tissue tendon muscle soft tissue and skin were excised with both the knife rondure.  The wound VAC sponges x2 were then packed into the wound.   Local tissue rearrangement was used to close the wound that was 4 x 12 cm.  The wound VAC was attached and this had a good suction fit it was set for 2 hours of suction 10-minute dwell time 10 cc of irrigation.  Skin was prepped using Clavulin, benzoin and the Ioban drape was wrapped with Coban.  DISCHARGE PLANNING: Antibiotic duration: Anticipate patient will need at least 4 weeks of IV antibiotics soft tissue cultures are pending.  Patient may require a PICC line. Weightbearing: Nonweightbearing on the right. Pain medication: Opioid pathway ordered Dressing care/ Wound VAC: Cleanse choice irrigation wound VAC in place Ambulatory devices: Walker. Discharge to: Possible discharge to skilled nursing. Plan for return to the operating room on Friday with repeat debridement repeat wound closure and possible split-thickness skin graft. Follow-up: In the office 1 week post operative.    Scheduled Meds: . docusate sodium  100 mg Oral BID  . insulin aspart  0-15 Units Subcutaneous TID WC  . insulin glargine  15 Units Subcutaneous QHS  . senna-docusate  1 tablet Oral QHS   Continuous Infusions: .  sodium chloride 10 mL/hr at 04/22/18 1231  . ampicillin-sulbactam (UNASYN) IV 3 g (04/22/18 0146)  . lactated ringers 10 mL/hr at 04/22/18 0840  . methocarbamol (ROBAXIN) IV    . sodium chloride     PRN Meds:.[START ON 04/23/2018] acetaminophen, bisacodyl, HYDROmorphone (DILAUDID) injection, magnesium citrate, methocarbamol **OR** methocarbamol (ROBAXIN) IV, metoCLOPramide **OR** metoCLOPramide (REGLAN) injection, morphine injection, ondansetron **OR** ondansetron (ZOFRAN) IV, oxyCODONE, oxyCODONE, polyethylene glycol    Objective: Vitals:   04/22/18 1015 04/22/18 1021 04/22/18 1045 04/22/18 1239  BP: (!) 148/81 (!) 151/73 (!) 148/87 (!) 155/87  Pulse: 86 86 91 87  Resp: 14 16 18  (!) 22  Temp:  (!) 97.2 F (36.2 C) 98.1 F (36.7 C) 97.9 F (36.6 C)  TempSrc:   Oral Oral  SpO2: 93% 95% 94%  95%  Weight:      Height:        Intake/Output Summary (Last 24 hours) at 04/22/2018 1601 Last data filed at 04/22/2018 1045 Gross per 24 hour  Intake 1410 ml  Output 80 ml  Net 1330 ml   Filed Weights   04/20/18 1703 04/22/18 0835  Weight: 92 kg 92 kg    Examination Constitutional: NAD Eyes: PERRL, lids and conjunctivae normal ENMT: Mucous membranes are moist.  Respiratory: clear to auscultation bilaterally, no wheezing, no crackles.  Cardiovascular: Regular rate and rhythm, no murmurs / rubs / gallops. No edema Abdomen: no tenderness. Bowel sounds positive.  Musculoskeletal: no clubbing / cyanosis.  Skin: no rashes, right foot wrapped w/ wound VAC in place Neurologic: CN 2-12 grossly intact. Strength 5/5 in all 4.  Psychiatric: Normal judgment and insight. Alert and oriented x 3. Normal mood.    Data Reviewed: I have independently reviewed following labs and imaging studies   CBC: Recent Labs  Lab 04/17/18 1019 04/20/18 1717 04/21/18 0410 04/22/18 0353  WBC 12.6* 15.7* 15.1* 12.5*  NEUTROABS 10.3* 12.4* 11.7* 9.4*  HGB 15.5* 15.2* 13.3 12.9  HCT 45.5 42.7 39.1 35.9*  MCV 82.7 78.1* 80.8 77.5*  PLT 326.0 377 369 128   Basic Metabolic Panel: Recent Labs  Lab 04/17/18 1019 04/20/18 1717 04/21/18 0410 04/22/18 0353  NA 136 132* 136 137  K 3.7 2.8* 3.4* 3.5  CL 96 96* 104 104  CO2 28 24 21* 23  GLUCOSE 341* 420* 291* 258*  BUN 8 13 14 13   CREATININE 0.88 0.91 1.07* 1.45*  CALCIUM 9.5 8.9 8.1* 8.4*  MG  --   --  1.7 1.7   GFR: Estimated Creatinine Clearance: 36.6 mL/min (A) (by C-G formula based on SCr of 1.45 mg/dL (H)). Liver Function Tests: Recent Labs  Lab 04/17/18 1019 04/20/18 1717  AST 10 18  ALT 11 18  ALKPHOS 91 115  BILITOT 1.3* 0.9  PROT 7.6 8.4*  ALBUMIN 4.0 3.5   No results for input(s): LIPASE, AMYLASE in the last 168 hours. No results for input(s): AMMONIA in the last 168 hours. Coagulation Profile: No results for input(s): INR,  PROTIME in the last 168 hours. Cardiac Enzymes: No results for input(s): CKTOTAL, CKMB, CKMBINDEX, TROPONINI in the last 168 hours. BNP (last 3 results) No results for input(s): PROBNP in the last 8760 hours. HbA1C: No results for input(s): HGBA1C in the last 72 hours. CBG: Recent Labs  Lab 04/21/18 1643 04/21/18 2135 04/22/18 0752 04/22/18 1013 04/22/18 1209  GLUCAP 175* 218* 205* 161* 224*   Lipid Profile: No results for input(s): CHOL, HDL, LDLCALC, TRIG, CHOLHDL, LDLDIRECT in the last 72 hours.  Thyroid Function Tests: No results for input(s): TSH, T4TOTAL, FREET4, T3FREE, THYROIDAB in the last 72 hours. Anemia Panel: No results for input(s): VITAMINB12, FOLATE, FERRITIN, TIBC, IRON, RETICCTPCT in the last 72 hours. Urine analysis:    Component Value Date/Time   COLORURINE CANCELED 04/17/2018 1019   APPEARANCEUR CLEAR (A) 01/30/2015 1634   APPEARANCEUR Hazy 07/26/2013 2055   LABSPEC 1.014 01/30/2015 1634   LABSPEC 1.014 07/26/2013 2055   PHURINE 6.0 01/30/2015 1634   GLUCOSEU NEGATIVE 01/30/2015 1634   GLUCOSEU Negative 07/26/2013 2055   HGBUR 3+ (A) 01/30/2015 1634   BILIRUBINUR NEGATIVE 01/30/2015 1634   BILIRUBINUR Negative 07/26/2013 2055   KETONESUR TRACE (A) 01/30/2015 1634   PROTEINUR 30 (A) 01/30/2015 1634   NITRITE NEGATIVE 01/30/2015 1634   LEUKOCYTESUR TRACE (A) 01/30/2015 1634   LEUKOCYTESUR 1+ 07/26/2013 2055   Sepsis Labs: Invalid input(s): PROCALCITONIN, LACTICIDVEN  Recent Results (from the past 240 hour(s))  Blood culture (routine x 2)     Status: None (Preliminary result)   Collection Time: 04/20/18  7:49 PM  Result Value Ref Range Status   Specimen Description   Final    BLOOD LEFT FOREARM Performed at [facility], [address] [place], [place] [address]    Special Requests   Final    BOTTLES DRAWN AEROBIC AND ANAEROBIC Blood Culture results may not be optimal due to an excessive volume of blood received in culture bottles  Performed at [facility] [address], [place], [place] [address]    Culture   Final    NO GROWTH 1 DAY Performed at [facility], [place] [address] [place], [place] [address]    Report Status PENDING  Incomplete  Wound or Superficial Culture     Status: None (Preliminary result)   Collection Time: 04/20/18  7:50 PM  Result Value Ref Range Status   Specimen Description   Final    ABSCESS Performed at [facility] [address] [place], [place] [address]    Special Requests   Final    Immunocompromised Performed at [facility], [place] [address], [place], [place] [address]    Gram Stain   Final    FEW WBC PRESENT, PREDOMINANTLY PMN MODERATE GRAM POSITIVE RODS FEW GRAM POSITIVE COCCI IN CHAINS RARE GRAM NEGATIVE RODS    Culture   Final    ABUNDANT GROUP B STREP(S.AGALACTIAE)ISOLATED TESTING AGAINST S. AGALACTIAE NOT ROUTINELY PERFORMED DUE TO PREDICTABILITY OF AMP/PEN/VAN SUSCEPTIBILITY. Performed at [facility], [place] [address] [place], [place] [address]    Report Status PENDING  Incomplete  Blood culture (routine x 2)     Status: Abnormal (Preliminary result)   Collection Time: 04/20/18  7:54 PM  Result Value Ref Range Status   Specimen Description   Final    BLOOD RIGHT FOREARM Performed at [facility], [place] [address], [place], [place] [address]    Special Requests   Final    BOTTLES DRAWN AEROBIC AND ANAEROBIC Blood Culture adequate volume Performed at [facility] [address], [place],  [address]    Culture  Setup Time   Final    GRAM POSITIVE COCCI IN BOTH AEROBIC AND ANAEROBIC BOTTLES CRITICAL RESULT CALLED TO, READ BACK BY AND VERIFIED WITH: T DANG PHARMD 1645 04/21/18 A BROWNING    Culture (A)  Final    STAPHYLOCOCCUS AUREUS SUSCEPTIBILITIES TO FOLLOW Performed at [facility], [place]  [address], [place], [place] [address]    Report Status PENDING   Incomplete  Blood Culture ID Panel (Reflexed)     Status: Abnormal   Collection Time: 04/20/18  7:54 PM  Result Value Ref Range Status   Enterococcus species NOT DETECTED NOT DETECTED Final   Listeria monocytogenes NOT DETECTED NOT DETECTED Final   Staphylococcus species DETECTED (A) NOT DETECTED Final    Comment: CRITICAL RESULT CALLED TO, READ BACK BY AND VERIFIED WITH: T DANG PHARMD 1645 04/21/18 A BROWNING    Staphylococcus aureus (BCID) DETECTED (A) NOT DETECTED Final    Comment: Methicillin (oxacillin) susceptible Staphylococcus aureus (MSSA). Preferred therapy is anti staphylococcal beta lactam antibiotic (Cefazolin or Nafcillin), unless clinically contraindicated. CRITICAL RESULT CALLED TO, READ BACK BY AND VERIFIED WITH: T DANG PHARMD 1645 04/21/18 A BROWNING    Methicillin resistance NOT DETECTED NOT DETECTED Final   Streptococcus species NOT DETECTED NOT DETECTED Final   Streptococcus agalactiae NOT DETECTED NOT DETECTED Final   Streptococcus pneumoniae NOT DETECTED NOT DETECTED Final   Streptococcus pyogenes NOT DETECTED NOT DETECTED Final   Acinetobacter baumannii NOT DETECTED NOT DETECTED Final   Enterobacteriaceae species NOT DETECTED NOT DETECTED Final   Enterobacter cloacae complex NOT DETECTED NOT DETECTED Final   Escherichia coli NOT DETECTED NOT DETECTED Final   Klebsiella oxytoca NOT DETECTED NOT DETECTED Final   Klebsiella pneumoniae NOT DETECTED NOT DETECTED Final   Proteus species NOT DETECTED NOT DETECTED Final   Serratia marcescens NOT DETECTED NOT DETECTED Final   Haemophilus influenzae NOT DETECTED NOT DETECTED Final   Neisseria meningitidis NOT DETECTED NOT DETECTED Final   Pseudomonas aeruginosa NOT DETECTED NOT DETECTED Final   Candida albicans NOT DETECTED NOT DETECTED Final   Candida glabrata NOT DETECTED NOT DETECTED Final   Candida krusei NOT DETECTED NOT DETECTED Final   Candida parapsilosis NOT DETECTED NOT DETECTED Final   Candida tropicalis NOT  DETECTED NOT DETECTED Final    Comment: Performed at [facility], [place]. [address] [place], [place] [address]  Culture, blood (routine x 2)     Status: None (Preliminary result)   Collection Time: 04/21/18  6:45 PM  Result Value Ref Range Status   Specimen Description BLOOD LEFT ANTECUBITAL  Final   Special Requests   Final    BOTTLES DRAWN AEROBIC ONLY Blood Culture adequate volume   Culture   Final    NO GROWTH < 24 HOURS Performed at [facility], [place] [address], [place], [place] [address]    Report Status PENDING  Incomplete  Culture, blood (routine x 2)     Status: None (Preliminary result)   Collection Time: 04/21/18  6:55 PM  Result Value Ref Range Status   Specimen Description BLOOD LEFT ANTECUBITAL  Final   Special Requests   Final    BOTTLES DRAWN AEROBIC ONLY Blood Culture adequate volume   Culture   Final    NO GROWTH < 24 HOURS Performed at [facility], [place] [address], [place], [place] [address]    Report Status PENDING  Incomplete  Aerobic/Anaerobic Culture (surgical/deep wound)     Status: None (Preliminary result)   Collection Time: 04/22/18  9:03 AM  Result Value Ref Range Status   Specimen Description TISSUE  Final   Special Requests RIGHT FOOT  Final   Gram Stain   Final    MODERATE WBC PRESENT, PREDOMINANTLY PMN MODERATE GRAM POSITIVE COCCI RARE GRAM POSITIVE RODS Performed at [facility], [address] [place], [place] [address]    Culture PENDING  Incomplete   Report Status PENDING  Incomplete      Radiology Studies: Dg Foot Complete Right  Result Date: 04/20/2018 CLINICAL DATA:  Foot pain and swelling EXAM: RIGHT FOOT COMPLETE - 3+ VIEW COMPARISON:  04/17/2018 FINDINGS: Previously seen linear foreign body in the plantar soft tissues of the foot is again identified with appears slightly deeper. Adjacent soft tissue swelling is noted consistent with the given clinical history. No acute fracture is seen. Tarsal degenerative  changes. IMPRESSION: No acute fractures noted. Previously seen foreign body is again identified slightly deeper in the plantar soft tissues. Electronically Signed   By: Inez Catalina M.D.   On: 04/20/2018 21:14    Marzetta Board, MD, PhD [facility]  Contact via  [email or web address]  [place] P: [phone]  F: [phone]  \

## 2018-04-22 NOTE — Progress Notes (Signed)
  Echocardiogram 2D Echocardiogram has been performed.  Johny Chess 04/22/2018, 2:35 PM

## 2018-04-22 NOTE — Progress Notes (Signed)
Report given to Lindsi RN in Short Stay.  The patient is ready for OR

## 2018-04-22 NOTE — Op Note (Signed)
04/22/2018  9:39 AM  PATIENT:  [name]    PRE-OPERATIVE DIAGNOSIS:  Abscess and Foreign Body Right Foot  POST-OPERATIVE DIAGNOSIS:  Same  PROCEDURE:  RIGHT FOOT DEBRIDEMENT AND FOREIGN BODY REMOVAL Local tissue rearrangement for wound closure 120 x 40 mm. Application of installation wound VAC with the reticulated foam and solid foam. Tissue cultures obtained.  SURGEON:  Newt Minion, MD  PHYSICIAN ASSISTANT:None ANESTHESIA:   General  PREOPERATIVE INDICATIONS:  [name] is a  78 y.o. female with a diagnosis of Abscess and Foreign Body Right Foot who failed conservative measures and elected for surgical management.    The risks benefits and alternatives were discussed with the patient preoperatively including but not limited to the risks of infection, bleeding, nerve injury, cardiopulmonary complications, the need for revision surgery, among others, and the patient was willing to proceed.  OPERATIVE IMPLANTS: Cleanse choice wound VAC foam 2 pieces.  @ENCIMAGES @  OPERATIVE FINDINGS: Patient had a large abscess and necrotic tissue that extended across the entire plantar aspect of her foot and extended dorsally over the lateral aspect of her foot.  Tissue was sent for cultures.  OPERATIVE PROCEDURE: Patient was brought the operating room and underwent a general anesthetic.  After adequate levels anesthesia were obtained patient's right lower extremity was prepped using ChloraPrep draped in the sterile field a timeout was called.  A elliptical incision was made over the lateral aspect of the foot to encompass all the necrotic tissue.  Tissue was sent for cultures this left a wound that was 40 x 120 mm.  There was extensive necrotic tissue that stented across the plantar aspect of the foot a rondure was used to remove this necrotic tissue on the plantar aspect that was superficial to the plantar muscles.  Foreign body was removed with the soft tissue necrotic  tissue.  The extensor tendons that were involved over the fifth metatarsal were excised.  There was abscess that extended dorsally over the foot and this was debrided and a pulse lavage was used to irrigate the wound including both the dorsal and the plantar wounds.  This was debrided back to healthy viable muscle tissue tendon muscle soft tissue and skin were excised with both the knife rondure.  The wound VAC sponges x2 were then packed into the wound.  Local tissue rearrangement was used to close the wound that was 4 x 12 cm.  The wound VAC was attached and this had a good suction fit it was set for 2 hours of suction 10-minute dwell time 10 cc of irrigation.  Skin was prepped using Clavulin, benzoin and the Ioban drape was wrapped with Coban.   DISCHARGE PLANNING:  Antibiotic duration: Anticipate patient will need at least 4 weeks of IV antibiotics soft tissue cultures are pending.  Patient may require a PICC line.  Weightbearing: Nonweightbearing on the right.  Pain medication: Opioid pathway ordered  Dressing care/ Wound VAC: Cleanse choice irrigation wound VAC in place  Ambulatory devices: Walker.  Discharge to: Possible discharge to skilled nursing.  Plan for return to the operating room on Friday with repeat debridement repeat wound closure and possible split-thickness skin graft.  Follow-up: In the office 1 week post operative.

## 2018-04-22 NOTE — Interval H&P Note (Signed)
History and Physical Interval Note:  04/22/2018 6:36 AM  [name]  has presented today for surgery, with the diagnosis of Abscess and Foreign Body Right Foot.  The various methods of treatment have been discussed with the patient and family. After consideration of risks, benefits and other options for treatment, the patient has consented to  Procedure(s): RIGHT FOOT DEBRIDEMENT AND FOREIGN BODY REMOVAL (Right) as a surgical intervention.  The patient's history has been reviewed, patient examined, no change in status, stable for surgery.  I have reviewed the patient's chart and labs.  Questions were answered to the patient's satisfaction.     Newt Minion

## 2018-04-22 NOTE — Evaluation (Signed)
Physical Therapy Evaluation Patient Details Name: [name] MRN: [id number] DOB: 07/20/1940 Today's Date: 04/22/2018   History of Present Illness  [name] is a 78 y.o. 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 Patient reports that she accidentally stepped on broken glass about 2 weeks ago. Underwent I&D with wound vac placement on 3/24 by Dr Sharol Given  Clinical Impression  Pt admitted with above diagnosis. Pt currently with functional limitations due to the deficits listed below (see PT Problem List). Pt follows commands and can give some history but very childlike voice and demeanor. Min A for bed mobility and transfer to recliner. Unable to ambulate keeping RLE NWB. Pt will benefit from skilled PT to increase their independence and safety with mobility to allow discharge to the venue listed below.       Follow Up Recommendations SNF;Supervision/Assistance - 24 hour    Equipment Recommendations  None recommended by PT    Recommendations for Other Services       Precautions / Restrictions Precautions Precautions: Fall Restrictions Weight Bearing Restrictions: Yes RLE Weight Bearing: Non weight bearing      Mobility  Bed Mobility Overal bed mobility: Needs Assistance Bed Mobility: Supine to Sit     Supine to sit: Min assist     General bed mobility comments: min A to trunk for elevation into sitting  Transfers Overall transfer level: Needs assistance Equipment used: Rolling walker (2 wheeled);None Transfers: Sit to/from [employer] Sit to Stand: Min assist   Squat pivot transfers: Min assist     General transfer comment: min A for sit<>stand to RW but pt unable to hop on LLE to pivot to chair so squat pivot performed without RW to chair on L with min A of PT for  support  Ambulation/Gait             General Gait Details: unable to keep RLE NWB for ambulation  Stairs            Wheelchair Mobility    Modified Rankin (Stroke Patients Only)       Balance Overall balance assessment: Needs assistance Sitting-balance support: Single extremity supported Sitting balance-Leahy Scale: Fair     Standing balance support: Bilateral upper extremity supported Standing balance-Leahy Scale: Poor Standing balance comment: reliant on UE support                             Pertinent Vitals/Pain Pain Assessment: Faces Faces Pain Scale: Hurts even more Pain Location: R foot Pain Descriptors / Indicators: Aching;Operative site guarding Pain Intervention(s): Limited activity within patient's tolerance;Monitored during session;Premedicated before session    Home Living Family/patient expects to be discharged to:: Skilled nursing facility Living Arrangements: Alone               Additional Comments: family had concerns about her alone recently after finding urine and feces on the floor of her home. She reports that her grandson lives with her    Prior Function Level of Independence: Independent         Comments: pt reports that she ambulated with a RW and bathed and dressed herself, unsure of accuracy of history though     Hand Dominance        Extremity/Trunk Assessment   Upper Extremity Assessment  Upper Extremity Assessment: Defer to OT evaluation    Lower Extremity Assessment Lower Extremity Assessment: RLE deficits/detail RLE Deficits / Details: NWB foot, knee ext 3+/5 RLE Sensation: decreased light touch RLE Coordination: decreased gross motor    Cervical / Trunk Assessment Cervical / Trunk Assessment: Normal  Communication   Communication: (high, childlike voice and does a lot of nodding without verb)  Cognition Arousal/Alertness: Awake/alert Behavior During Therapy: WFL for tasks  assessed/performed Overall Cognitive Status: No family/caregiver present to determine baseline cognitive functioning                                 General Comments: speaks in a very high pitch childlike voice most of the time. Sometimes avoids answering question and only nods head or points. Unsure of her baseline      General Comments      Exercises     Assessment/Plan    PT Assessment Patient needs continued PT services  PT Problem List Decreased balance;Decreased mobility;Decreased coordination;Decreased knowledge of use of DME;Decreased knowledge of precautions;Pain;Obesity       PT Treatment Interventions DME instruction;Gait training;Functional mobility training;Stair training;Therapeutic activities;Therapeutic exercise;Balance training;Patient/family education;Neuromuscular re-education;Cognitive remediation    PT Goals (Current goals can be found in the Care Plan section)  Acute Rehab PT Goals Patient Stated Goal: pt wants to go back to her apt PT Goal Formulation: With patient Time For Goal Achievement: 05/06/18 Potential to Achieve Goals: Good    Frequency Min 2X/week   Barriers to discharge Decreased caregiver support      Co-evaluation               AM-PAC PT "6 Clicks" Mobility  Outcome Measure Help needed turning from your back to your side while in a flat bed without using bedrails?: A Little Help needed moving from lying on your back to sitting on the side of a flat bed without using bedrails?: A Little Help needed moving to and from a bed to a chair (including a wheelchair)?: A Little Help needed standing up from a chair using your arms (e.g., wheelchair or bedside chair)?: A Lot Help needed to walk in hospital room?: Total Help needed climbing 3-5 steps with a railing? : Total 6 Click Score: 13    End of Session Equipment Utilized During Treatment: Gait belt Activity Tolerance: Patient tolerated treatment well Patient left: in  chair;with call bell/phone within reach Nurse Communication: Mobility status PT Visit Diagnosis: Pain;Difficulty in walking, not elsewhere classified (R26.2) Pain - Right/Left: Right Pain - part of body: Ankle and joints of foot    Time: 7628-3151 PT Time Calculation (min) (ACUTE ONLY): 23 min   Charges:   PT Evaluation $PT Eval Moderate Complexity: 1 Mod PT Treatments $Therapeutic Activity: 8-22 mins        Leighton Roach, PT  Acute Rehab Services  Pager [phone] Office [place] 04/22/2018, 5:11 PM

## 2018-04-23 ENCOUNTER — Ambulatory Visit (INDEPENDENT_AMBULATORY_CARE_PROVIDER_SITE_OTHER): Payer: Self-pay | Admitting: Physician Assistant

## 2018-04-23 ENCOUNTER — Other Ambulatory Visit (INDEPENDENT_AMBULATORY_CARE_PROVIDER_SITE_OTHER): Payer: Self-pay | Admitting: Family

## 2018-04-23 ENCOUNTER — Encounter (HOSPITAL_COMMUNITY): Payer: Self-pay | Admitting: Orthopedic Surgery

## 2018-04-23 ENCOUNTER — Inpatient Hospital Stay: Payer: Self-pay

## 2018-04-23 DIAGNOSIS — I330 Acute and subacute infective endocarditis: Secondary | ICD-10-CM

## 2018-04-23 DIAGNOSIS — B951 Streptococcus, group B, as the cause of diseases classified elsewhere: Secondary | ICD-10-CM

## 2018-04-23 DIAGNOSIS — I358 Other nonrheumatic aortic valve disorders: Secondary | ICD-10-CM | POA: Diagnosis present

## 2018-04-23 LAB — CBC WITH DIFFERENTIAL/PLATELET
Abs Immature Granulocytes: 0.07 10*3/uL (ref 0.00–0.07)
Basophils Absolute: 0 10*3/uL (ref 0.0–0.1)
Basophils Relative: 0 %
Eosinophils Absolute: 0.1 10*3/uL (ref 0.0–0.5)
Eosinophils Relative: 1 %
HCT: 34.9 % — ABNORMAL LOW (ref 36.0–46.0)
Hemoglobin: 12 g/dL (ref 12.0–15.0)
Immature Granulocytes: 1 %
Lymphocytes Relative: 12 %
Lymphs Abs: 1.4 10*3/uL (ref 0.7–4.0)
MCH: 27 pg (ref 26.0–34.0)
MCHC: 34.4 g/dL (ref 30.0–36.0)
MCV: 78.4 fL — AB (ref 80.0–100.0)
MONOS PCT: 6 %
Monocytes Absolute: 0.7 10*3/uL (ref 0.1–1.0)
Neutro Abs: 9.7 10*3/uL — ABNORMAL HIGH (ref 1.7–7.7)
Neutrophils Relative %: 80 %
Platelets: 394 10*3/uL (ref 150–400)
RBC: 4.45 MIL/uL (ref 3.87–5.11)
RDW: 13.2 % (ref 11.5–15.5)
WBC: 12 10*3/uL — ABNORMAL HIGH (ref 4.0–10.5)
nRBC: 0 % (ref 0.0–0.2)

## 2018-04-23 LAB — BASIC METABOLIC PANEL
Anion gap: 8 (ref 5–15)
BUN: 14 mg/dL (ref 8–23)
CO2: 25 mmol/L (ref 22–32)
Calcium: 8.5 mg/dL — ABNORMAL LOW (ref 8.9–10.3)
Chloride: 103 mmol/L (ref 98–111)
Creatinine, Ser: 1.4 mg/dL — ABNORMAL HIGH (ref 0.44–1.00)
GFR calc Af Amer: 42 mL/min — ABNORMAL LOW (ref >60)
GFR calc non Af Amer: 36 mL/min — ABNORMAL LOW (ref >60)
Glucose, Bld: 223 mg/dL — ABNORMAL HIGH (ref 70–99)
Potassium: 3.5 mmol/L (ref 3.5–5.1)
SODIUM: 136 mmol/L (ref 135–145)

## 2018-04-23 LAB — URINALYSIS, ROUTINE W REFLEX MICROSCOPIC
BILIRUBIN URINE: NEGATIVE
Glucose, UA: 50 mg/dL — AB
Ketones, ur: NEGATIVE mg/dL
Nitrite: NEGATIVE
Protein, ur: NEGATIVE mg/dL
Specific Gravity, Urine: 1.023 (ref 1.005–1.030)
pH: 5 (ref 5.0–8.0)

## 2018-04-23 LAB — CULTURE, BLOOD (ROUTINE X 2): Special Requests: ADEQUATE

## 2018-04-23 LAB — GLUCOSE, CAPILLARY
GLUCOSE-CAPILLARY: 152 mg/dL — AB (ref 70–99)
Glucose-Capillary: 171 mg/dL — ABNORMAL HIGH (ref 70–99)
Glucose-Capillary: 140 mg/dL — ABNORMAL HIGH (ref 70–99)

## 2018-04-23 LAB — SURGICAL PCR SCREEN
MRSA, PCR: NEGATIVE
Staphylococcus aureus: POSITIVE — AB

## 2018-04-23 LAB — MAGNESIUM: Magnesium: 1.7 mg/dL (ref 1.7–2.4)

## 2018-04-23 MED ORDER — LIVING WELL WITH DIABETES BOOK
Freq: Once | Status: AC
  Administered 2018-04-23: 04:00:00

## 2018-04-23 MED ORDER — SODIUM CHLORIDE 0.9 % IV SOLN
3.0000 g | Freq: Four times a day (QID) | INTRAVENOUS | Status: DC
  Administered 2018-04-23 – 2018-04-26 (×12): 3 g via INTRAVENOUS
  Filled 2018-04-23 (×13): qty 3

## 2018-04-23 NOTE — Evaluation (Signed)
Occupational Therapy Evaluation Patient Details Name: [name] MRN: [id number] DOB: [birth year] Today's Date: 04/23/2018    History of Present Illness 78 y.o. 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 Patient reports that she accidentally stepped on broken glass about 2 weeks ago. Underwent I&D with wound vac placement on 3/24 by Dr Sharol Given   Clinical Impression   Patient is s/p I &D wound vac R LE surgery resulting in functional limitations due to the deficits listed below (see OT problem list). Pt currently min (A) for basic transfer and mod - max (A) for all LB adls. Pt is able to initiate but with poor hygiene.  Patient will benefit from skilled OT acutely to increase independence and safety with ADLS to allow discharge SNF.     Follow Up Recommendations  SNF    Equipment Recommendations  Wheelchair cushion (measurements OT);Wheelchair (measurements OT);3 in 1 bedside commode    Recommendations for Other Services       Precautions / Restrictions Precautions Precautions: Fall Restrictions Weight Bearing Restrictions: No RLE Weight Bearing: Non weight bearing      Mobility Bed Mobility               General bed mobility comments: in chair ona rrival with LLE placed on bed surface, urine under the chair   Transfers Overall transfer level: Needs assistance Equipment used: Rolling walker (2 wheeled) Transfers: Sit to/from [employer] Sit to Stand: Min assist         General transfer comment: pt able to powe rup and maintain NWB     Balance Overall balance assessment: Needs assistance         Standing balance support: Bilateral upper extremity supported Standing balance-Leahy Scale: Poor Standing balance comment: reliant on UE support                            ADL either performed or assessed with clinical judgement   ADL Overall ADL's : Needs assistance/impaired Eating/Feeding: Set up   Grooming: Set up   Upper Body Bathing: Minimal assistance   Lower Body Bathing: Maximal assistance   Upper Body Dressing : Minimal assistance   Lower Body Dressing: Maximal assistance   Toilet Transfer: Moderate assistance Toilet Transfer Details (indicate cue type and reason): simulated sit<>stand from chair            General ADL Comments: pt completing a bath on arrival. pt with purewick partly doff and incontinence in the chair lacking awareness. pt continuing to wash with soiled linen. pt required sit<S>Tadn total (A) for peri care     Vision         Perception     Praxis      Pertinent Vitals/Pain Pain Assessment: No/denies pain     Hand Dominance Right   Extremity/Trunk Assessment Upper Extremity Assessment Upper Extremity Assessment: Overall WFL for tasks assessed   Lower Extremity Assessment Lower Extremity Assessment: Defer to PT evaluation   Cervical / Trunk Assessment Cervical / Trunk Assessment: Normal   Communication Communication Communication: Other (comment)(high, childlike voice )   Cognition Arousal/Alertness: Awake/alert Behavior During Therapy: WFL for tasks assessed/performed Overall Cognitive Status: No family/caregiver present to determine baseline cognitive functioning  General Comments: speaks in a very high pitch childlike voice most of the time. Sometimes avoids answering question and only nods head or points. Unsure of her baseline   General Comments  pt able to verbalize precautions with NWB but speaks in a childish high pitch voice. pt agreeable to input from therapist. pt will transition ot a normal tone voice when speaking more reflective. 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 I was paid to talk. I love to  talk"    Exercises     Shoulder Instructions      Home Living Family/patient expects to be discharged to:: Skilled nursing facility Living Arrangements: Alone                               Additional Comments: no family present      Prior Functioning/Environment Level of Independence: Independent        Comments: pt reports that she ambulated with a RW and bathed and dressed herself, unsure of accuracy of history though        OT Problem List: Decreased strength;Decreased range of motion;Decreased activity tolerance;Impaired balance (sitting and/or standing);Decreased safety awareness;Decreased knowledge of use of DME or AE;Decreased knowledge of precautions;Cardiopulmonary status limiting activity;Obesity;Pain      OT Treatment/Interventions: Self-care/ADL training;Therapeutic exercise;Neuromuscular education;Energy conservation;DME and/or AE instruction;Manual therapy;Modalities;Therapeutic activities;Cognitive remediation/compensation;Patient/family education;Balance training    OT Goals(Current goals can be found in the care plan section) Acute Rehab OT Goals Patient Stated Goal: pt see family because she has not seen anyone in 4 days OT Goal Formulation: Patient unable to participate in goal setting Time For Goal Achievement: 05/07/18 Potential to Achieve Goals: Good  OT Frequency: Min 2X/week   Barriers to D/C: Decreased caregiver support          Co-evaluation              AM-PAC OT "6 Clicks" Daily Activity     Outcome Measure Help from another person eating meals?: None Help from another person taking care of personal grooming?: A Little Help from another person toileting, which includes using toliet, bedpan, or urinal?: A Lot Help from another person bathing (including washing, rinsing, drying)?: A Lot Help from another person to put on and taking off regular upper body clothing?: A Little Help from another person to put on and taking off  regular lower body clothing?: A Lot 6 Click Score: 16   End of Session Equipment Utilized During Treatment: Rolling walker Nurse Communication: Mobility status;Precautions;Weight bearing status  Activity Tolerance: Patient tolerated treatment well Patient left: in chair;with call bell/phone within reach  OT Visit Diagnosis: Unsteadiness on feet (R26.81);Muscle weakness (generalized) (M62.81)                Time: 4401-0272 OT Time Calculation (min): 19 min Charges:  OT General Charges $OT Visit: 1 Visit OT Evaluation $OT Eval Moderate Complexity: 1 Mod   Jeri Modena, OTR/L  Acute Rehabilitation Services Pager: [phone] Office: [phone] .   Jeri Modena 04/23/2018, 11:55 AM

## 2018-04-23 NOTE — Progress Notes (Signed)
Inpatient Diabetes Program Recommendations  AACE/ADA: New Consensus Statement on Inpatient Glycemic Control (2015)  Target Ranges:  Prepandial:   less than 140 mg/dL      Peak postprandial:   less than 180 mg/dL (1-2 hours)      Critically ill patients:  140 - 180 mg/dL   Lab Results  Component Value Date   GLUCAP 152 (H) 04/23/2018   HGBA1C 13.5 (H) 04/17/2018    Review of Glycemic Control Results for Hannah, [name] (MRN [id number]) as of 04/23/2018 13:27  Ref. Range 04/22/2018 16:51 04/22/2018 21:12 04/23/2018 07:51 04/23/2018 11:34  Glucose-Capillary Latest Ref Range: 70 - 99 mg/dL 241 (H) 188 (H) 171 (H) 152 (H)   Diabetes history: Type 2 DM Outpatient Diabetes medications: Lantus 10 units QHS, Humalog 75/25 units BID, Sitagliptin-Metformin 50-1000 mg QD Current orders for Inpatient glycemic control: Novolog 0-9 units TID, Novolog 0-5 units QHS, Novolog 3 units TID, Lantus 20 units QHS  Inpatient Diabetes Program Recommendations:    Noted updated orders, in agreement. Noted consult. Patient was not taking insulin as prescribed as patient has experienced significant decline (per family). Attempted to contact patient (by phone, as coordinator is working off campus). However, did note in MD note "Home safety concerns PT OT evaluation recommended skilled nursing facility and difficulty with patient communication. Will attempt again on 3/26 for disposition plan and plan to speak with patient.   Thanks, Bronson Curb, MSN, RNC-OB Diabetes Coordinator [phone] (8a-5p)

## 2018-04-23 NOTE — Plan of Care (Signed)
  Problem: Pain Managment: Goal: General experience of comfort will improve Outcome: Progressing   Problem: Safety: Goal: Ability to remain free from injury will improve Outcome: Progressing   

## 2018-04-23 NOTE — Progress Notes (Signed)
[facility]                                                                              Patient Demographics  [name], is a 78 y.o. female, DOB - 1940/11/10, DEY:[id number]  Admit date - 04/20/2018   Admitting Physician Colbert Ewing, MD  Outpatient Primary MD for the patient is Hannah, [name], NP  Outpatient specialists:   LOS - 3  days   Medical records reviewed and are as summarized below:    Chief Complaint  Patient presents with  . Right Foot / Ankle Pain       Brief summary   [name] a 78 y.o.femalewithhx of, GERD, ovarian cancer s/p tumor resection in 2017, and uncontrolled DMT2 on insulin (currently not taking insulin) who presented with worsening R foot pain and swelling x 1 week. Patient reports that she accidentally stepped on broken glass about 2 weeks ago. Since then, noticed pain with weight bearing on the R foot and swelling of the foot and ankle. She was seen by outpatient podiatry on 3/19 where an XR showed soft tissue swelling and possible 22m foreign object in the superficial R plantar soft tissue.  She saw podiatry as an outpatient on 3/19 and plans were for patient to get an IND, received the block however did not wait for the doctor to come back, claims like she was never told how long she would have to wait and got upset for not being communicated with appropriately and left.  She was advised to go to the ED at that time but patient's husband brought her home instead.  Came back to the ER on 3/22 with worsening right foot pain and swelling.  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  Patient met sepsis physiology at the  time of admission with tachycardia, leukocytosis, source due to right foot infection -Patient had bedside I&D performed in ED on 3/22, follow-up x-ray showed persistent foreign body -Orthopedics was consulted, patient underwent right foot debridement and foreign body removal on 3/24, postop day #1.  Wound VAC in place. - Per Dr. 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 CMegan Salonrecommended continue Unasyn.  - Repeat blood cultures negative after 48 hours, will place PICC  Diabetes mellitus type 2, uncontrolled, IDDM with complications including hyperglycemia, diabetic foot infection -CBGs currently stable, hemoglobin A1c on 3/19 was 13.5 -Continue Lantus 20 units daily, NovoLog meal coverage 3 units 3 times daily AC, sliding scale insulin sensitive  Hypokalemia Resolved  Essential hypertension Continue Norvasc, hold home lisinopril for now, creatinine 1.4  Mild acute kidney  injury - Creatinine 1.4 on 3/24, slightly trending down  -hold home lisinopril, placed on IV fluids  Home safety concerns PT OT evaluation recommended skilled nursing facility  Code Status: Full code DVT Prophylaxis:  SCD's Family Communication: Discussed in detail with the patient, all imaging results, lab results explained to the patient    Disposition Plan: Await OR on 3/27, will need skilled nursing facility postop  Time Spent in minutes 35 minutes  Procedures:  PROCEDURE:  RIGHT FOOT DEBRIDEMENT AND FOREIGN BODY REMOVAL*  Consultants:   Orthopedics Infectious disease  Antimicrobials:   Anti-infectives (From admission, onward)   Start     Dose/Rate Route Frequency Ordered Stop    04/23/18 1600  Ampicillin-Sulbactam (UNASYN) 3 g in sodium chloride 0.9 % 100 mL IVPB     3 g 200 mL/hr over 30 Minutes Intravenous Every 6 hours 04/23/18 1007     04/21/18 1800  vancomycin (VANCOCIN) 1,250 mg in sodium chloride 0.9 % 250 mL IVPB  Status:  Discontinued     1,250 mg 166.7 mL/hr over 90 Minutes Intravenous Every 24 hours 04/21/18 0851 04/21/18 1703   04/21/18 1800  Ampicillin-Sulbactam (UNASYN) 3 g in sodium chloride 0.9 % 100 mL IVPB  Status:  Discontinued     3 g 200 mL/hr over 30 Minutes Intravenous Every 8 hours 04/21/18 1749 04/23/18 1007   04/21/18 1000  cefTRIAXone (ROCEPHIN) 2 g in sodium chloride 0.9 % 100 mL IVPB  Status:  Discontinued     2 g 200 mL/hr over 30 Minutes Intravenous Every 24 hours 04/20/18 2025 04/20/18 2026   04/21/18 1000  metroNIDAZOLE (FLAGYL) IVPB 500 mg  Status:  Discontinued     500 mg 100 mL/hr over 60 Minutes Intravenous Every 8 hours 04/21/18 0824 04/21/18 1729   04/21/18 0600  cefTRIAXone (ROCEPHIN) 2 g in sodium chloride 0.9 % 100 mL IVPB  Status:  Discontinued     2 g 200 mL/hr over 30 Minutes Intravenous Every 24 hours 04/20/18 2026 04/21/18 1729   04/20/18 2100  vancomycin (VANCOCIN) 1,500 mg in sodium chloride 0.9 % 500 mL IVPB     1,500 mg 250 mL/hr over 120 Minutes Intravenous  Once 04/20/18 2009 04/20/18 2231   04/20/18 2000  piperacillin-tazobactam (ZOSYN) IVPB 3.375 g     3.375 g 100 mL/hr over 30 Minutes Intravenous  Once 04/20/18 1947 04/20/18 2031          Medications  Scheduled Meds: . amLODipine  5 mg Oral BID  . docusate sodium  100 mg Oral BID  . insulin aspart  0-5 Units Subcutaneous QHS  . insulin aspart  0-9 Units Subcutaneous TID WC  . insulin aspart  3 Units Subcutaneous TID WC  . insulin glargine  20 Units Subcutaneous QHS  . senna-docusate  1 tablet Oral QHS   Continuous Infusions: . sodium chloride 75 mL/hr at 04/23/18 0823  . ampicillin-sulbactam (UNASYN) IV    . methocarbamol (ROBAXIN) IV    .  sodium chloride     PRN Meds:.acetaminophen, bisacodyl, HYDROmorphone (DILAUDID) injection, magnesium citrate, methocarbamol **OR** methocarbamol (ROBAXIN) IV, morphine injection, ondansetron **OR** ondansetron (ZOFRAN) IV, oxyCODONE, oxyCODONE, polyethylene glycol      Subjective:   Yaritsa Picado was seen and examined today.  Patient denies dizziness, chest pain, shortness of breath, abdominal pain, N/V/D/C, new weakness, numbess, tingling. No acute events overnight.  No fevers  Objective:   Vitals:   04/22/18 1045 04/22/18 1239 04/22/18 2011 04/23/18 0428  BP: (!) 148/87 Marland Kitchen)  155/87 133/74 134/83  Pulse: 91 87 77 68  Resp: 18 (!) 22 16 14   Temp: 98.1 F (36.7 C) 97.9 F (36.6 C) 97.7 F (36.5 C) (!) 97.5 F (36.4 C)  TempSrc: Oral Oral Oral Oral  SpO2: 94% 95% 96% 100%  Weight:      Height:        Intake/Output Summary (Last 24 hours) at 04/23/2018 1225 Last data filed at 04/23/2018 0900 Gross per 24 hour  Intake 1692.14 ml  Output 875 ml  Net 817.14 ml     Wt Readings from Last 3 Encounters:  04/22/18 92 kg  04/17/18 92.8 kg  04/19/16 101.6 kg     Exam  General: Alert and oriented x 3, NAD  Eyes:   HEENT:  Atraumatic, normocephalic  Cardiovascular: S1 S2 auscultated, no rubs, murmurs or gallops. Regular rate and rhythm.  Respiratory: Clear to auscultation bilaterally, no wheezing, rales or rhonchi  Gastrointestinal: Soft, nontender, nondistended, + bowel sounds  Ext: no pedal edema bilaterally  Neuro: No new FND's  Musculoskeletal: No digital cyanosis, clubbing  Skin: Wound VAC right lower extremity  Psych: Normal affect and demeanor, alert and oriented x3    Data Reviewed:  I have personally reviewed following labs and imaging studies  Micro Results Recent Results (from the past 240 hour(s))  Blood culture (routine x 2)     Status: None (Preliminary result)   Collection Time: 04/20/18  7:49 PM  Result Value Ref Range Status   Specimen  Description   Final    BLOOD LEFT FOREARM Performed at [facility], [address], [place], [place] [address]    Special Requests   Final    BOTTLES DRAWN AEROBIC AND ANAEROBIC Blood Culture results may not be optimal due to an excessive volume of blood received in culture bottles Performed at [facility] [address], [place], [place] [address]    Culture   Final    NO GROWTH 2 DAYS Performed at [facility] [address], [place], [place] [address]    Report Status PENDING  Incomplete  Wound or Superficial Culture     Status: None (Preliminary result)   Collection Time: 04/20/18  7:50 PM  Result Value Ref Range Status   Specimen Description   Final    ABSCESS Performed at [facility] [address] [place], [place] [address]    Special Requests   Final    Immunocompromised Performed at [facility], [place] [address] [place], [place] [address]    Gram Stain   Final    FEW WBC PRESENT, PREDOMINANTLY PMN MODERATE GRAM POSITIVE RODS FEW GRAM POSITIVE COCCI IN CHAINS RARE GRAM NEGATIVE RODS    Culture   Final    ABUNDANT GROUP B STREP(S.AGALACTIAE)ISOLATED TESTING AGAINST S. AGALACTIAE NOT ROUTINELY PERFORMED DUE TO PREDICTABILITY OF AMP/PEN/VAN SUSCEPTIBILITY. Performed at [facility], [place] [address] [place], [place] [address]    Report Status PENDING  Incomplete  Blood culture (routine x 2)     Status: Abnormal   Collection Time: 04/20/18  7:54 PM  Result Value Ref Range Status   Specimen Description   Final    BLOOD RIGHT FOREARM Performed at [facility], [place] [address] [place], [place] [address]    Special Requests   Final    BOTTLES DRAWN AEROBIC AND ANAEROBIC Blood Culture adequate volume Performed at [facility] [address]  [place], [place] [address]    Culture  Setup Time   Final    GRAM POSITIVE COCCI IN BOTH AEROBIC AND ANAEROBIC BOTTLES  CRITICAL RESULT CALLED TO, READ BACK BY AND VERIFIED WITHDiona Browner PHARMD 1645 04/21/18 A BROWNING Performed at [facility], [place] [address], [place], [place] [address]    Culture STAPHYLOCOCCUS AUREUS (A)  Final   Report Status 04/23/2018 FINAL  Final   Organism ID, Bacteria STAPHYLOCOCCUS AUREUS  Final      Susceptibility   Staphylococcus aureus - MIC*    CIPROFLOXACIN <=0.5 SENSITIVE Sensitive     ERYTHROMYCIN <=0.25 SENSITIVE Sensitive     GENTAMICIN <=0.5 SENSITIVE Sensitive     OXACILLIN <=0.25 SENSITIVE Sensitive     TETRACYCLINE <=1 SENSITIVE Sensitive     VANCOMYCIN <=0.5 SENSITIVE Sensitive     TRIMETH/SULFA <=10 SENSITIVE Sensitive     CLINDAMYCIN <=0.25 SENSITIVE Sensitive     RIFAMPIN <=0.5 SENSITIVE Sensitive     Inducible Clindamycin NEGATIVE Sensitive     * STAPHYLOCOCCUS AUREUS  Blood Culture ID Panel (Reflexed)     Status: Abnormal   Collection Time: 04/20/18  7:54 PM  Result Value Ref Range Status   Enterococcus species NOT DETECTED NOT DETECTED Final   Listeria monocytogenes NOT DETECTED NOT DETECTED Final   Staphylococcus species DETECTED (A) NOT DETECTED Final    Comment: CRITICAL RESULT CALLED TO, READ BACK BY AND VERIFIED WITH: T DANG PHARMD 1645 04/21/18 A BROWNING    Staphylococcus aureus (BCID) DETECTED (A) NOT DETECTED Final    Comment: Methicillin (oxacillin) susceptible Staphylococcus aureus (MSSA). Preferred therapy is anti staphylococcal beta lactam antibiotic (Cefazolin or Nafcillin), unless clinically contraindicated. CRITICAL RESULT CALLED TO, READ BACK BY AND VERIFIED WITH: T DANG PHARMD 1645 04/21/18 A BROWNING    Methicillin resistance NOT DETECTED NOT DETECTED Final   Streptococcus species NOT DETECTED NOT DETECTED Final   Streptococcus agalactiae NOT DETECTED NOT DETECTED Final   Streptococcus pneumoniae NOT DETECTED NOT DETECTED Final   Streptococcus pyogenes NOT DETECTED NOT DETECTED Final   Acinetobacter baumannii NOT DETECTED NOT  DETECTED Final   Enterobacteriaceae species NOT DETECTED NOT DETECTED Final   Enterobacter cloacae complex NOT DETECTED NOT DETECTED Final   Escherichia coli NOT DETECTED NOT DETECTED Final   Klebsiella oxytoca NOT DETECTED NOT DETECTED Final   Klebsiella pneumoniae NOT DETECTED NOT DETECTED Final   Proteus species NOT DETECTED NOT DETECTED Final   Serratia marcescens NOT DETECTED NOT DETECTED Final   Haemophilus influenzae NOT DETECTED NOT DETECTED Final   Neisseria meningitidis NOT DETECTED NOT DETECTED Final   Pseudomonas aeruginosa NOT DETECTED NOT DETECTED Final   Candida albicans NOT DETECTED NOT DETECTED Final   Candida glabrata NOT DETECTED NOT DETECTED Final   Candida krusei NOT DETECTED NOT DETECTED Final   Candida parapsilosis NOT DETECTED NOT DETECTED Final   Candida tropicalis NOT DETECTED NOT DETECTED Final    Comment: Performed at [facility], [place] [address] [place], [place] [address]  Culture, blood (routine x 2)     Status: None (Preliminary result)   Collection Time: 04/21/18  6:45 PM  Result Value Ref Range Status   Specimen Description BLOOD LEFT ANTECUBITAL  Final   Special Requests   Final    BOTTLES DRAWN AEROBIC ONLY Blood Culture adequate volume   Culture   Final    NO GROWTH 2 DAYS Performed at [facility], [place] [address] [place], [place] [address]    Report  Status PENDING  Incomplete  Culture, blood (routine x 2)     Status: None (Preliminary result)   Collection Time: 04/21/18  6:55 PM  Result Value Ref Range Status   Specimen Description BLOOD LEFT ANTECUBITAL  Final   Special Requests   Final    BOTTLES DRAWN AEROBIC ONLY Blood Culture adequate volume   Culture   Final    NO GROWTH 2 DAYS Performed at [facility], [address], [place], [place] [address]    Report Status PENDING  Incomplete  Aerobic/Anaerobic Culture (surgical/deep wound)     Status: None (Preliminary result)   Collection Time: 04/22/18  9:03 AM  Result  Value Ref Range Status   Specimen Description TISSUE  Final   Special Requests RIGHT FOOT  Final   Gram Stain   Final    MODERATE WBC PRESENT, PREDOMINANTLY PMN MODERATE GRAM POSITIVE COCCI RARE GRAM POSITIVE RODS    Culture   Final    MODERATE GROUP B STREP(S.AGALACTIAE)ISOLATED TESTING AGAINST S. AGALACTIAE NOT ROUTINELY PERFORMED DUE TO PREDICTABILITY OF AMP/PEN/VAN SUSCEPTIBILITY. Performed at [facility], [place] [address], [place], [place] [address]    Report Status PENDING  Incomplete  Surgical pcr screen     Status: Abnormal   Collection Time: 04/23/18  7:20 AM  Result Value Ref Range Status   MRSA, PCR NEGATIVE NEGATIVE Final   Staphylococcus aureus POSITIVE (A) NEGATIVE Final    Comment: (NOTE) The Xpert SA Assay (FDA approved for NASAL specimens in patients 67 years of age and older), is one component of a comprehensive surveillance program. It is not intended to diagnose infection nor to guide or monitor treatment. Performed at [facility], [place] [address] [place],  [address]     Radiology Reports Dg Foot Complete Right  Result Date: 04/20/2018 CLINICAL DATA:  Foot pain and swelling EXAM: RIGHT FOOT COMPLETE - 3+ VIEW COMPARISON:  04/17/2018 FINDINGS: Previously seen linear foreign body in the plantar soft tissues of the foot is again identified with appears slightly deeper. Adjacent soft tissue swelling is noted consistent with the given clinical history. No acute fracture is seen. Tarsal degenerative changes. IMPRESSION: No acute fractures noted. Previously seen foreign body is again identified slightly deeper in the plantar soft tissues. Electronically Signed   By: Inez Catalina M.D.   On: 04/20/2018 21:14   Dg Foot Complete Right  Result Date: 04/17/2018 CLINICAL DATA:  Stepped on glass with acute RIGHT foot pain. Initial encounter. EXAM: RIGHT FOOT COMPLETE - 3+ VIEW COMPARISON:  None. FINDINGS: Diffuse soft tissue swelling noted. A faint possible  3 mm linear foreign body within the superficial plantar soft tissues noted at the level of the metatarsal bases. No acute fracture or dislocation. Mild degenerative changes in the hindfoot and midfoot noted. No suspicious focal bony lesions identified. IMPRESSION: Equivocal 3 mm linear foreign body within the superficial plantar soft tissues at the level of the metatarsal bases. Soft tissue swelling without acute bony abnormality. Electronically Signed   By: Margarette Canada M.D.   On: 04/17/2018 11:49   [name] Ekg Site Rite  Result Date: 04/23/2018 If Site Rite image not attached, placement could not be confirmed due to current cardiac rhythm.   Lab Data:  CBC: Recent Labs  Lab 04/17/18 1019 04/20/18 1717 04/21/18 0410 04/22/18 0353 04/23/18 0329  WBC 12.6* 15.7* 15.1* 12.5* 12.0*  NEUTROABS 10.3* 12.4* 11.7* 9.4* 9.7*  HGB 15.5* 15.2* 13.3 12.9 12.0  HCT 45.5  42.7 39.1 35.9* 34.9*  MCV 82.7 78.1* 80.8 77.5* 78.4*  PLT 326.0 377 369 364 116   Basic Metabolic Panel: Recent Labs  Lab 04/17/18 1019 04/20/18 1717 04/21/18 0410 04/22/18 0353 04/23/18 0329  NA 136 132* 136 137 136  K 3.7 2.8* 3.4* 3.5 3.5  CL 96 96* 104 104 103  CO2 28 24 21* 23 25  GLUCOSE 341* 420* 291* 258* 223*  BUN 8 13 14 13 14   CREATININE 0.88 0.91 1.07* 1.45* 1.40*  CALCIUM 9.5 8.9 8.1* 8.4* 8.5*  MG  --   --  1.7 1.7 1.7   GFR: Estimated Creatinine Clearance: 37.9 mL/min (A) (by C-G formula based on SCr of 1.4 mg/dL (H)). Liver Function Tests: Recent Labs  Lab 04/17/18 1019 04/20/18 1717  AST 10 18  ALT 11 18  ALKPHOS 91 115  BILITOT 1.3* 0.9  PROT 7.6 8.4*  ALBUMIN 4.0 3.5   No results for input(s): LIPASE, AMYLASE in the last 168 hours. No results for input(s): AMMONIA in the last 168 hours. Coagulation Profile: No results for input(s): INR, PROTIME in the last 168 hours. Cardiac Enzymes: No results for input(s): CKTOTAL, CKMB, CKMBINDEX, TROPONINI in the last 168 hours. BNP (last 3 results)  No results for input(s): PROBNP in the last 8760 hours. HbA1C: No results for input(s): HGBA1C in the last 72 hours. CBG: Recent Labs  Lab 04/22/18 1209 04/22/18 1651 04/22/18 2112 04/23/18 0751 04/23/18 1134  GLUCAP 224* 241* 188* 171* 152*   Lipid Profile: No results for input(s): CHOL, HDL, LDLCALC, TRIG, CHOLHDL, LDLDIRECT in the last 72 hours. Thyroid Function Tests: No results for input(s): TSH, T4TOTAL, FREET4, T3FREE, THYROIDAB in the last 72 hours. Anemia Panel: No results for input(s): VITAMINB12, FOLATE, FERRITIN, TIBC, IRON, RETICCTPCT in the last 72 hours. Urine analysis:    Component Value Date/Time   COLORURINE YELLOW 04/23/2018 0715   APPEARANCEUR HAZY (A) 04/23/2018 0715   APPEARANCEUR Hazy 07/26/2013 2055   LABSPEC 1.023 04/23/2018 0715   LABSPEC 1.014 07/26/2013 2055   PHURINE 5.0 04/23/2018 0715   GLUCOSEU 50 (A) 04/23/2018 0715   GLUCOSEU Negative 07/26/2013 2055   HGBUR SMALL (A) 04/23/2018 0715   BILIRUBINUR NEGATIVE 04/23/2018 0715   BILIRUBINUR Negative 07/26/2013 2055   KETONESUR NEGATIVE 04/23/2018 0715   PROTEINUR NEGATIVE 04/23/2018 0715   NITRITE NEGATIVE 04/23/2018 0715   LEUKOCYTESUR LARGE (A) 04/23/2018 0715   LEUKOCYTESUR 1+ 07/26/2013 2055     Kyllie Pettijohn M.D. [facility] 04/23/2018, 12:25 PM  Pager: [phone] Between 7am to 7pm - call Pager - [phone]  After 7pm go to [email or web address] - password [email or web address]  Call night coverage person covering after 7pm

## 2018-04-23 NOTE — Progress Notes (Signed)
[name] is a 78yo female with MSSA bacteremia and polymicrobial foot infection. Currently on Unasyn 3g q8h. Renal function stable and CrCl >63mL/min. Will change to Unasyn 3g q6h.  Thank you for involving pharmacy in this patient's care.  Janae Bridgeman, PharmD PGY1 Pharmacy Resident Phone: [phone] 04/23/2018 10:08 AM

## 2018-04-23 NOTE — Progress Notes (Signed)
Patient ID: [name], female   DOB: Jul 25, 1940, 78 y.o.   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  Her TTE showed a nonmobile vegetation on the coronary cusp of the aortic valve.  She will need 6 weeks of IV antibiotic therapy.  Repeat blood cultures are negative at 48 hours so a PICC can be placed now. Her operative culture from her right foot has grown group B strep but her Gram stains show multiple different bacteria.  I recommend continuing ampicillin sulbactam for 10 to 14 days to control her polymicrobial foot infection before converting to cefazolin for ceftriaxone to complete therapy for her bacteremia and endocarditis.  I spoke with her daughter-in-law, Leana Gamer, while I was in Ms. Nevel's room today.  Ms. Izola Price feels that SNF placement will be required.  PLAN: 1. Continue ampicillin sulbactam  2. 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 amLODipine  5 mg Oral BID  . docusate sodium  100 mg Oral BID  . insulin aspart  0-5 Units Subcutaneous QHS  . insulin aspart  0-9 Units Subcutaneous TID WC  . insulin aspart  3 Units Subcutaneous TID WC  . insulin glargine  20 Units Subcutaneous QHS  . senna-docusate  1 tablet Oral QHS   Continuous Infusions: . sodium chloride 75 mL/hr at 04/23/18 0823  . ampicillin-sulbactam (UNASYN) IV    . methocarbamol (ROBAXIN) IV    . sodium chloride     PRN  Meds:.acetaminophen, bisacodyl, HYDROmorphone (DILAUDID) injection, magnesium citrate, methocarbamol **OR** methocarbamol (ROBAXIN) IV, morphine injection, ondansetron **OR** ondansetron (ZOFRAN) IV, oxyCODONE, oxyCODONE, polyethylene glycol   SUBJECTIVE: She still having right foot pain but is feeling better.  Review of Systems: Review of Systems  Constitutional: Negative for chills, diaphoresis and fever.  Musculoskeletal: Positive for joint pain.    No Known Allergies  OBJECTIVE: Vitals:   04/22/18 1045 04/22/18 1239 04/22/18 2011 04/23/18 0428  BP: (!) 148/87 (!) 155/87 133/74 134/83  Pulse: 91 87 77 68  Resp: 18 (!) 22 16 14   Temp: 98.1 F (36.7 C) 97.9 F (36.6 C) 97.7 F (36.5 C) (!) 97.5 F (36.4 C)  TempSrc: Oral Oral Oral Oral  SpO2: 94% 95% 96% 100%  Weight:      Height:       Body mass index is 34.28 kg/m.  Physical Exam Constitutional:      Comments: She is alert, talkative.  She is upset that no one has talked to her family about what is going on.  Cardiovascular:     Rate and Rhythm: Normal rate and regular rhythm.     Heart sounds: No murmur.  Pulmonary:     Effort: Pulmonary effort is normal.     Breath sounds: Normal breath sounds.  Musculoskeletal:     Comments: Her right foot and lower leg are wrapped.  She has a drain in place.  Psychiatric:        Mood and Affect: Mood normal.     Lab Results Lab Results  Component Value Date   WBC 12.0 (H) 04/23/2018   HGB 12.0 04/23/2018   HCT 34.9 (L) 04/23/2018   MCV 78.4 (L) 04/23/2018   PLT 394 04/23/2018    Lab Results  Component Value Date   CREATININE 1.40 (H) 04/23/2018   BUN 14 04/23/2018   NA 136 04/23/2018   K 3.5 04/23/2018   CL 103 04/23/2018   CO2 25 04/23/2018    Lab Results  Component Value Date   ALT 18 04/20/2018   AST 18 04/20/2018   ALKPHOS 115 04/20/2018   BILITOT 0.9 04/20/2018     Microbiology: Recent Results (from the past 240 hour(s))  Blood culture (routine  x 2)     Status: None (Preliminary result)   Collection Time: 04/20/18  7:49 PM  Result Value Ref Range Status   Specimen Description   Final    BLOOD LEFT FOREARM Performed at [facility], [place] [address] [place], [place] [address]    Special Requests   Final    BOTTLES DRAWN AEROBIC AND ANAEROBIC Blood Culture results may not be optimal due to an excessive volume of blood received in culture bottles Performed at [facility] [address], [place], [place] [address]    Culture   Final    NO GROWTH 2 DAYS Performed at [facility] [address] [place], [place] [address]    Report Status PENDING  Incomplete  Wound or Superficial Culture     Status: None (Preliminary result)   Collection Time: 04/20/18  7:50 PM  Result Value Ref Range Status   Specimen Description   Final    ABSCESS Performed at [facility] [address] [place], [place] [address]    Special Requests   Final    Immunocompromised Performed at [facility], [place] [address], [place], [place] [address]    Gram Stain   Final    FEW WBC PRESENT, PREDOMINANTLY PMN MODERATE GRAM POSITIVE RODS FEW GRAM POSITIVE COCCI IN CHAINS RARE GRAM NEGATIVE RODS    Culture   Final    ABUNDANT GROUP B STREP(S.AGALACTIAE)ISOLATED TESTING AGAINST S. AGALACTIAE NOT ROUTINELY PERFORMED DUE TO PREDICTABILITY OF AMP/PEN/VAN SUSCEPTIBILITY. Performed at [facility], [place] [address], [place], [place] [address]    Report Status PENDING  Incomplete  Blood culture (routine x 2)     Status: Abnormal   Collection Time: 04/20/18  7:54 PM  Result Value Ref Range Status   Specimen Description   Final    BLOOD RIGHT FOREARM Performed at [facility], [place] [address] [place], [place] [address]    Special Requests   Final    BOTTLES DRAWN AEROBIC AND ANAEROBIC Blood Culture adequate volume Performed at [facility] [address], [place], [place] [address]    Culture  Setup Time   Final    GRAM POSITIVE COCCI IN BOTH AEROBIC AND ANAEROBIC BOTTLES CRITICAL RESULT CALLED TO, READ BACK BY AND VERIFIED WITHDiona Browner PHARMD 1645 04/21/18 A BROWNING Performed at [facility], [place] [address] [place], [place] [address]    Culture STAPHYLOCOCCUS AUREUS (A)  Final   Report Status 04/23/2018 FINAL  Final   Organism ID, Bacteria STAPHYLOCOCCUS AUREUS  Final  Susceptibility   Staphylococcus aureus - MIC*    CIPROFLOXACIN <=0.5 SENSITIVE Sensitive     ERYTHROMYCIN <=0.25 SENSITIVE Sensitive     GENTAMICIN <=0.5 SENSITIVE Sensitive     OXACILLIN <=0.25 SENSITIVE Sensitive     TETRACYCLINE <=1 SENSITIVE Sensitive     VANCOMYCIN <=0.5 SENSITIVE Sensitive     TRIMETH/SULFA <=10 SENSITIVE Sensitive     CLINDAMYCIN <=0.25 SENSITIVE Sensitive     RIFAMPIN <=0.5 SENSITIVE Sensitive     Inducible Clindamycin NEGATIVE Sensitive     * STAPHYLOCOCCUS AUREUS  Blood Culture ID Panel (Reflexed)     Status: Abnormal   Collection Time: 04/20/18  7:54 PM  Result Value Ref Range Status   Enterococcus species NOT DETECTED NOT DETECTED Final   Listeria monocytogenes NOT DETECTED NOT DETECTED Final   Staphylococcus species DETECTED (A) NOT DETECTED Final    Comment: CRITICAL RESULT CALLED TO, READ BACK BY AND VERIFIED WITH: T DANG PHARMD 1645 04/21/18 A BROWNING    Staphylococcus aureus (BCID) DETECTED (A) NOT DETECTED Final    Comment: Methicillin (oxacillin) susceptible Staphylococcus aureus (MSSA). Preferred therapy is anti staphylococcal beta lactam antibiotic (Cefazolin or Nafcillin), unless clinically contraindicated. CRITICAL RESULT CALLED TO, READ BACK BY AND VERIFIED WITH: T DANG PHARMD 1645 04/21/18 A BROWNING    Methicillin resistance NOT DETECTED NOT DETECTED Final   Streptococcus species NOT DETECTED NOT DETECTED Final   Streptococcus agalactiae NOT DETECTED NOT DETECTED Final   Streptococcus pneumoniae NOT DETECTED  NOT DETECTED Final   Streptococcus pyogenes NOT DETECTED NOT DETECTED Final   Acinetobacter baumannii NOT DETECTED NOT DETECTED Final   Enterobacteriaceae species NOT DETECTED NOT DETECTED Final   Enterobacter cloacae complex NOT DETECTED NOT DETECTED Final   Escherichia coli NOT DETECTED NOT DETECTED Final   Klebsiella oxytoca NOT DETECTED NOT DETECTED Final   Klebsiella pneumoniae NOT DETECTED NOT DETECTED Final   Proteus species NOT DETECTED NOT DETECTED Final   Serratia marcescens NOT DETECTED NOT DETECTED Final   Haemophilus influenzae NOT DETECTED NOT DETECTED Final   Neisseria meningitidis NOT DETECTED NOT DETECTED Final   Pseudomonas aeruginosa NOT DETECTED NOT DETECTED Final   Candida albicans NOT DETECTED NOT DETECTED Final   Candida glabrata NOT DETECTED NOT DETECTED Final   Candida krusei NOT DETECTED NOT DETECTED Final   Candida parapsilosis NOT DETECTED NOT DETECTED Final   Candida tropicalis NOT DETECTED NOT DETECTED Final    Comment: Performed at [facility], [place]. [address], [place], [place] [address]  Culture, blood (routine x 2)     Status: None (Preliminary result)   Collection Time: 04/21/18  6:45 PM  Result Value Ref Range Status   Specimen Description BLOOD LEFT ANTECUBITAL  Final   Special Requests   Final    BOTTLES DRAWN AEROBIC ONLY Blood Culture adequate volume   Culture   Final    NO GROWTH 2 DAYS Performed at [facility], [place] [address] [place], [place] [address]    Report Status PENDING  Incomplete  Culture, blood (routine x 2)     Status: None (Preliminary result)   Collection Time: 04/21/18  6:55 PM  Result Value Ref Range Status   Specimen Description BLOOD LEFT ANTECUBITAL  Final   Special Requests   Final    BOTTLES DRAWN AEROBIC ONLY Blood Culture adequate volume   Culture   Final    NO GROWTH 2 DAYS Performed at [facility], [place] [address] [place], [place] [address]  Report Status PENDING  Incomplete   Aerobic/Anaerobic Culture (surgical/deep wound)     Status: None (Preliminary result)   Collection Time: 04/22/18  9:03 AM  Result Value Ref Range Status   Specimen Description TISSUE  Final   Special Requests RIGHT FOOT  Final   Gram Stain   Final    MODERATE WBC PRESENT, PREDOMINANTLY PMN MODERATE GRAM POSITIVE COCCI RARE GRAM POSITIVE RODS    Culture   Final    MODERATE GROUP B STREP(S.AGALACTIAE)ISOLATED TESTING AGAINST S. AGALACTIAE NOT ROUTINELY PERFORMED DUE TO PREDICTABILITY OF AMP/PEN/VAN SUSCEPTIBILITY. Performed at [facility], [place] [address], [place], [place] [address]    Report Status PENDING  Incomplete  Surgical pcr screen     Status: Abnormal   Collection Time: 04/23/18  7:20 AM  Result Value Ref Range Status   MRSA, PCR NEGATIVE NEGATIVE Final   Staphylococcus aureus POSITIVE (A) NEGATIVE Final    Comment: (NOTE) The Xpert SA Assay (FDA approved for NASAL specimens in patients 75 years of age and older), is one component of a comprehensive surveillance program. It is not intended to diagnose infection nor to guide or monitor treatment. Performed at [facility], [place] [address] [place], [place] [address]     Michel Bickers, [facility] [phone] pager   [phone] cell 04/23/2018, 10:58 AM

## 2018-04-24 ENCOUNTER — Ambulatory Visit (INDEPENDENT_AMBULATORY_CARE_PROVIDER_SITE_OTHER): Payer: Self-pay | Admitting: Physician Assistant

## 2018-04-24 LAB — CBC WITH DIFFERENTIAL/PLATELET
Abs Immature Granulocytes: 0.05 10*3/uL (ref 0.00–0.07)
Basophils Absolute: 0.1 10*3/uL (ref 0.0–0.1)
Basophils Relative: 1 %
Eosinophils Absolute: 0.6 10*3/uL — ABNORMAL HIGH (ref 0.0–0.5)
Eosinophils Relative: 7 %
HCT: 35.7 % — ABNORMAL LOW (ref 36.0–46.0)
Hemoglobin: 12.4 g/dL (ref 12.0–15.0)
Immature Granulocytes: 1 %
Lymphocytes Relative: 19 %
Lymphs Abs: 1.6 10*3/uL (ref 0.7–4.0)
MCH: 27 pg (ref 26.0–34.0)
MCHC: 34.7 g/dL (ref 30.0–36.0)
MCV: 77.6 fL — ABNORMAL LOW (ref 80.0–100.0)
Monocytes Absolute: 0.7 10*3/uL (ref 0.1–1.0)
Monocytes Relative: 9 %
Neutro Abs: 5.2 10*3/uL (ref 1.7–7.7)
Neutrophils Relative %: 63 %
Platelets: 427 10*3/uL — ABNORMAL HIGH (ref 150–400)
RBC: 4.6 MIL/uL (ref 3.87–5.11)
RDW: 13.2 % (ref 11.5–15.5)
WBC: 8.1 10*3/uL (ref 4.0–10.5)
nRBC: 0 % (ref 0.0–0.2)

## 2018-04-24 LAB — AEROBIC CULTURE W GRAM STAIN (SUPERFICIAL SPECIMEN)

## 2018-04-24 LAB — BASIC METABOLIC PANEL
Anion gap: 7 (ref 5–15)
BUN: 11 mg/dL (ref 8–23)
CALCIUM: 9 mg/dL (ref 8.9–10.3)
CO2: 26 mmol/L (ref 22–32)
Chloride: 107 mmol/L (ref 98–111)
Creatinine, Ser: 1.17 mg/dL — ABNORMAL HIGH (ref 0.44–1.00)
GFR calc non Af Amer: 45 mL/min — ABNORMAL LOW (ref >60)
GFR, EST AFRICAN AMERICAN: 52 mL/min — AB (ref >60)
Glucose, Bld: 117 mg/dL — ABNORMAL HIGH (ref 70–99)
Potassium: 3.5 mmol/L (ref 3.5–5.1)
Sodium: 140 mmol/L (ref 135–145)

## 2018-04-24 LAB — GLUCOSE, CAPILLARY
Glucose-Capillary: 106 mg/dL — ABNORMAL HIGH (ref 70–99)
Glucose-Capillary: 119 mg/dL — ABNORMAL HIGH (ref 70–99)
Glucose-Capillary: 143 mg/dL — ABNORMAL HIGH (ref 70–99)
Glucose-Capillary: 104 mg/dL — ABNORMAL HIGH (ref 70–99)
Glucose-Capillary: 92 mg/dL (ref 70–99)

## 2018-04-24 LAB — MAGNESIUM: Magnesium: 1.8 mg/dL (ref 1.7–2.4)

## 2018-04-24 LAB — AEROBIC CULTURE  (SUPERFICIAL SPECIMEN)

## 2018-04-24 MED ORDER — SODIUM CHLORIDE 0.9% FLUSH
10.0000 mL | INTRAVENOUS | Status: DC | PRN
  Administered 2018-04-26: 10 mL
  Filled 2018-04-24: qty 40

## 2018-04-24 MED ORDER — HYDRALAZINE HCL 10 MG PO TABS
10.0000 mg | ORAL_TABLET | Freq: Three times a day (TID) | ORAL | Status: DC
  Administered 2018-04-24 – 2018-04-26 (×6): 10 mg via ORAL
  Filled 2018-04-24 (×6): qty 1

## 2018-04-24 MED ORDER — HYDRALAZINE HCL 20 MG/ML IJ SOLN: 10.0000 mg | Freq: Four times a day (QID) | INTRAMUSCULAR | Status: DC | PRN

## 2018-04-24 NOTE — Progress Notes (Signed)
Patient ID: [name], female   DOB: November 11, 1940, 78 y.o.   MRN: [id number]         [facility] Infect[facility] Disease  Date of Admission:  04/20/2018   Total days of antibiotics 4        Day 3 ampicillin sulbactam         ASSESSMENT: She has a polymicrobial soft tissue infection of her right foot complicated by transient MSSA bacteremia and probable aortic valve endocarditis.  I plan on 2 weeks of ampicillin sulbactam for her polymicrobial foot infection followed by an additional 4 weeks of IV cefazolin to complete therapy for her MSSA bacteremia and endocarditis.  PLAN: 1. Continue ampicillin sulbactam  2. PICC placement  Diagnosis: Right foot infection, bacteremia and endocarditis  Culture Result: MSSA bacteremia and polymicrobial foot infection  No Known Allergies  OPAT Orders Discharge antibiotics: Per pharmacy protocol ampicillin sulbactam for 2 weeks followed by cefazolin for 4 weeks  Duration: 6 weeks End Date: Ampicillin sulbactam 05/05/2018 Cefazolin 06/02/2018  PIC Care Per Protocol:  Labs weekly while on IV antibiotics: _x_ CBC with differential _x_ BMP __ CMP __ CRP __ ESR __ Vancomycin trough __ CK  _x_ Please pull PIC at completion of IV antibiotics __ Please leave PIC in place until doctor has seen patient or been notified  Fax weekly labs to [phone]  Clinic Follow Up Appt: 05/2018  Principal Problem:   Bacteremia due to methicillin susceptible Staphylococcus aureus (MSSA) Active Problems:   Foreign body in right foot   Diabetic foot infection ([place])   Abscess of foot   Aortic valve endocarditis   Uncontrolled diabetes mellitus ([place])   AKI (acute kidney injury) ([place])   Scheduled Meds: . amLODipine  5 mg Oral BID  . docusate sodium  100 mg Oral BID  . hydrALAZINE  10 mg Oral Q8H  . insulin aspart  0-5 Units Subcutaneous QHS  . insulin aspart  0-9 Units Subcutaneous TID WC  . insulin aspart  3 Units Subcutaneous TID  WC  . insulin glargine  20 Units Subcutaneous QHS  . senna-docusate  1 tablet Oral QHS   Continuous Infusions: . ampicillin-sulbactam (UNASYN) IV 3 g (04/24/18 0922)  . methocarbamol (ROBAXIN) IV    . sodium chloride     PRN Meds:.acetaminophen, bisacodyl, hydrALAZINE, HYDROmorphone (DILAUDID) injection, magnesium citrate, methocarbamol **OR** methocarbamol (ROBAXIN) IV, morphine injection, ondansetron **OR** ondansetron (ZOFRAN) IV, oxyCODONE, oxyCODONE, polyethylene glycol   SUBJECTIVE: She is feeling much better today.  She is not having any right foot pain currently.  Review of Systems: Review of Systems  Constitutional: Negative for chills, diaphoresis and fever.  Musculoskeletal: Negative for joint pain.    No Known Allergies  OBJECTIVE: Vitals:   04/23/18 1434 04/23/18 2100 04/23/18 2116 04/24/18 0352  BP: (!) 160/70 (!) 151/72 (!) 151/72 (!) 178/84  Pulse: 91 87 87 78  Resp: 16 14 14 15   Temp: 98.7 F (37.1 C) (!) 97.5 F (36.4 C) (!) 97.5 F (36.4 C) 98.1 F (36.7 C)  TempSrc: Oral Oral Oral Oral  SpO2: 95% 95% 95% 98%  Weight:      Height:       Body mass index is 34.28 kg/m.  Physical Exam Constitutional:      Comments: She is alert, talkative and in good spirits today.    Cardiovascular:     Rate and Rhythm: Normal rate and regular rhythm.     Heart sounds: No murmur.  Pulmonary:  Effort: Pulmonary effort is normal.     Breath sounds: Normal breath sounds.  Musculoskeletal:     Comments: Her right foot and lower leg are wrapped.   Psychiatric:        Mood and Affect: Mood normal.     Lab Results Lab Results  Component Value Date   WBC 8.1 04/24/2018   HGB 12.4 04/24/2018   HCT 35.7 (L) 04/24/2018   MCV 77.6 (L) 04/24/2018   PLT 427 (H) 04/24/2018    Lab Results  Component Value Date   CREATININE 1.17 (H) 04/24/2018   BUN 11 04/24/2018   NA 140 04/24/2018   K 3.5 04/24/2018   CL 107 04/24/2018   CO2 26 04/24/2018    Lab Results   Component Value Date   ALT 18 04/20/2018   AST 18 04/20/2018   ALKPHOS 115 04/20/2018   BILITOT 0.9 04/20/2018     Microbiology: Recent Results (from the past 240 hour(s))  Blood culture (routine x 2)     Status: None (Preliminary result)   Collection Time: 04/20/18  7:49 PM  Result Value Ref Range Status   Specimen Description   Final    BLOOD LEFT FOREARM Performed at [facility], [place] [address] [place], [place] [address]    Special Requests   Final    BOTTLES DRAWN AEROBIC AND ANAEROBIC Blood Culture results may not be optimal due to an excessive volume of blood received in culture bottles Performed at [facility] [address], [place], [place] [address]    Culture   Final    NO GROWTH 2 DAYS Performed at [facility] [address] [place], [place] [address]    Report Status PENDING  Incomplete  Wound or Superficial Culture     Status: None   Collection Time: 04/20/18  7:50 PM  Result Value Ref Range Status   Specimen Description   Final    ABSCESS Performed at [facility] [address], [place], [place] [address]    Special Requests   Final    Immunocompromised Performed at [facility], [place] [address], [place], [place] [address]    Gram Stain   Final    FEW WBC PRESENT, PREDOMINANTLY PMN MODERATE GRAM POSITIVE RODS FEW GRAM POSITIVE COCCI IN CHAINS RARE GRAM NEGATIVE RODS    Culture   Final    ABUNDANT GROUP B STREP(S.AGALACTIAE)ISOLATED TESTING AGAINST S. AGALACTIAE NOT ROUTINELY PERFORMED DUE TO PREDICTABILITY OF AMP/PEN/VAN SUSCEPTIBILITY. FEW LACTOBACILLUS SPECIES Standardized susceptibility testing for this organism is not available. Performed at [facility], [place] [address], [place], [place] [address]    Report Status 04/24/2018 FINAL  Final  Blood culture (routine x 2)     Status: Abnormal   Collection Time: 04/20/18  7:54 PM  Result Value Ref Range Status    Specimen Description   Final    BLOOD RIGHT FOREARM Performed at [facility], [place] [address] [place], [place] [address]    Special Requests   Final    BOTTLES DRAWN AEROBIC AND ANAEROBIC Blood Culture adequate volume Performed at [facility] [address], [place], [place] [address]    Culture  Setup Time   Final    GRAM POSITIVE COCCI IN BOTH AEROBIC AND ANAEROBIC BOTTLES CRITICAL RESULT CALLED TO, READ BACK BY AND VERIFIED WITHDiona Browner PHARMD 1645 04/21/18 A BROWNING Performed at [facility], [place]  [address] [place], [place] [address]    Culture STAPHYLOCOCCUS AUREUS (A)  Final   Report Status 04/23/2018 FINAL  Final   Organism ID, Bacteria STAPHYLOCOCCUS AUREUS  Final      Susceptibility   Staphylococcus aureus - MIC*    CIPROFLOXACIN <=0.5 SENSITIVE Sensitive     ERYTHROMYCIN <=0.25 SENSITIVE Sensitive     GENTAMICIN <=0.5 SENSITIVE Sensitive     OXACILLIN <=0.25 SENSITIVE Sensitive     TETRACYCLINE <=1 SENSITIVE Sensitive     VANCOMYCIN <=0.5 SENSITIVE Sensitive     TRIMETH/SULFA <=10 SENSITIVE Sensitive     CLINDAMYCIN <=0.25 SENSITIVE Sensitive     RIFAMPIN <=0.5 SENSITIVE Sensitive     Inducible Clindamycin NEGATIVE Sensitive     * STAPHYLOCOCCUS AUREUS  Blood Culture ID Panel (Reflexed)     Status: Abnormal   Collection Time: 04/20/18  7:54 PM  Result Value Ref Range Status   Enterococcus species NOT DETECTED NOT DETECTED Final   Listeria monocytogenes NOT DETECTED NOT DETECTED Final   Staphylococcus species DETECTED (A) NOT DETECTED Final    Comment: CRITICAL RESULT CALLED TO, READ BACK BY AND VERIFIED WITH: T DANG PHARMD 1645 04/21/18 A BROWNING    Staphylococcus aureus (BCID) DETECTED (A) NOT DETECTED Final    Comment: Methicillin (oxacillin) susceptible Staphylococcus aureus (MSSA). Preferred therapy is anti staphylococcal beta lactam antibiotic (Cefazolin or Nafcillin), unless clinically contraindicated. CRITICAL RESULT CALLED  TO, READ BACK BY AND VERIFIED WITH: T DANG PHARMD 1645 04/21/18 A BROWNING    Methicillin resistance NOT DETECTED NOT DETECTED Final   Streptococcus species NOT DETECTED NOT DETECTED Final   Streptococcus agalactiae NOT DETECTED NOT DETECTED Final   Streptococcus pneumoniae NOT DETECTED NOT DETECTED Final   Streptococcus pyogenes NOT DETECTED NOT DETECTED Final   Acinetobacter baumannii NOT DETECTED NOT DETECTED Final   Enterobacteriaceae species NOT DETECTED NOT DETECTED Final   Enterobacter cloacae complex NOT DETECTED NOT DETECTED Final   Escherichia coli NOT DETECTED NOT DETECTED Final   Klebsiella oxytoca NOT DETECTED NOT DETECTED Final   Klebsiella pneumoniae NOT DETECTED NOT DETECTED Final   Proteus species NOT DETECTED NOT DETECTED Final   Serratia marcescens NOT DETECTED NOT DETECTED Final   Haemophilus influenzae NOT DETECTED NOT DETECTED Final   Neisseria meningitidis NOT DETECTED NOT DETECTED Final   Pseudomonas aeruginosa NOT DETECTED NOT DETECTED Final   Candida albicans NOT DETECTED NOT DETECTED Final   Candida glabrata NOT DETECTED NOT DETECTED Final   Candida krusei NOT DETECTED NOT DETECTED Final   Candida parapsilosis NOT DETECTED NOT DETECTED Final   Candida tropicalis NOT DETECTED NOT DETECTED Final    Comment: Performed at [facility], [place]. [address] [place], [place] [address]  Culture, blood (routine x 2)     Status: None (Preliminary result)   Collection Time: 04/21/18  6:45 PM  Result Value Ref Range Status   Specimen Description BLOOD LEFT ANTECUBITAL  Final   Special Requests   Final    BOTTLES DRAWN AEROBIC ONLY Blood Culture adequate volume   Culture   Final    NO GROWTH 2 DAYS Performed at [facility], [place] [address], [place], [place] [address]    Report Status PENDING  Incomplete  Culture, blood (routine x 2)     Status: None (Preliminary result)   Collection Time: 04/21/18  6:55 PM  Result Value Ref Range Status   Specimen  Description BLOOD LEFT ANTECUBITAL  Final   Special Requests   Final  BOTTLES DRAWN AEROBIC ONLY Blood Culture adequate volume   Culture   Final    NO GROWTH 2 DAYS Performed at [facility], [place] [address] [place], [place] [address]    Report Status PENDING  Incomplete  Aerobic/Anaerobic Culture (surgical/deep wound)     Status: None (Preliminary result)   Collection Time: 04/22/18  9:03 AM  Result Value Ref Range Status   Specimen Description TISSUE  Final   Special Requests RIGHT FOOT  Final   Gram Stain   Final    MODERATE WBC PRESENT, PREDOMINANTLY PMN MODERATE GRAM POSITIVE COCCI RARE GRAM POSITIVE RODS Performed at [facility], [place] [address] [place], [place] [address]    Culture   Final    MODERATE GROUP B STREP(S.AGALACTIAE)ISOLATED TESTING AGAINST S. AGALACTIAE NOT ROUTINELY PERFORMED DUE TO PREDICTABILITY OF AMP/PEN/VAN SUSCEPTIBILITY. NO ANAEROBES ISOLATED; CULTURE IN PROGRESS FOR 5 DAYS    Report Status PENDING  Incomplete  Surgical pcr screen     Status: Abnormal   Collection Time: 04/23/18  7:20 AM  Result Value Ref Range Status   MRSA, PCR NEGATIVE NEGATIVE Final   Staphylococcus aureus POSITIVE (A) NEGATIVE Final    Comment: (NOTE) The Xpert SA Assay (FDA approved for NASAL specimens in patients 46 years of age and older), is one component of a comprehensive surveillance program. It is not intended to diagnose infection nor to guide or monitor treatment. Performed at [facility], [place] [address], [place], [place] [address]     Michel Bickers, [place] [place] [facility] [phone] pager   [phone] cell 04/24/2018, 10:33 AM

## 2018-04-24 NOTE — Progress Notes (Signed)
Occupational Therapy Treatment Patient Details Name: [name] MRN: [id number] DOB: 12/16/1940 Today's Date: 04/24/2018    History of present illness 78 y.o. 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 Patient reports that she accidentally stepped on broken glass about 2 weeks ago. Underwent I&D with wound vac placement on 3/24 by Dr Sharol Given   OT comments  Pt progressing toward OT goals. Pt engaged in toilet transfer from bed to [facility] with Mod A, with Min A for sit to stand transition. Pt highly distracted and required several VCs for safety awareness due to neglect to use RW. Pt reminded of importance of RW due to NWB status. Pt will benefit from continued skilled OT for safety awareness, and engagement in ADLs for a safe transition to next venue. DC and frequency still remains appropriate. OT will continue to follow acutely.    Follow Up Recommendations  SNF    Equipment Recommendations  Wheelchair cushion (measurements OT);Wheelchair (measurements OT);3 in 1 bedside commode    Recommendations for Other Services      Precautions / Restrictions Precautions Precautions: Fall Restrictions Weight Bearing Restrictions: Yes RLE Weight Bearing: Non weight bearing       Mobility Bed Mobility Overal bed mobility: Needs Assistance Bed Mobility: Supine to Sit;Sit to Supine     Supine to sit: Supervision Sit to supine: Supervision   General bed mobility comments: supervisionfor safety; use of rails and HOB elevated slightly  Transfers Overall transfer level: Needs assistance Equipment used: Rolling walker (2 wheeled) Transfers: Sit to/from Stand Sit to Stand: Min assist         General transfer comment: good maintanence of WB status, MIn A sit to stand, Mod A transfer to toilet for safety.    Balance Overall  balance assessment: Needs assistance Sitting-balance support: Single extremity supported Sitting balance-Leahy Scale: Good     Standing balance support: Bilateral upper extremity supported Standing balance-Leahy Scale: Poor Standing balance comment: reliant on UE support                           ADL either performed or assessed with clinical judgement   ADL Overall ADL's : Needs assistance/impaired                         Toilet Transfer: Moderate assistance Toilet Transfer Details (indicate cue type and reason): toilet transfer from bed to [facility]          Functional mobility during ADLs: Moderate assistance General ADL Comments: Pt very distracted throughout session required several verbal cue to redirect towards tasks. VCs for hand placement and sequencing for safety.     Vision       Perception     Praxis      Cognition Arousal/Alertness: Awake/alert Behavior During Therapy: Anxious Overall Cognitive Status: No family/caregiver present to determine baseline cognitive functioning Area of Impairment: Attention;Following commands;Safety/judgement;Problem solving                   Current Attention Level: Sustained   Following Commands: Follows one step commands inconsistently;Follows one step commands with increased time Safety/Judgement: Decreased awareness of safety;Decreased awareness of deficits   Problem Solving: Difficulty sequencing;Requires verbal cues General Comments: speaks in a very high pitch childlike voice most of the time;  pt is very distracted by pain and does not follow commands when pain increases        Exercises     Shoulder Instructions       General Comments      Pertinent Vitals/ Pain       Pain Assessment: Faces Faces Pain Scale: Hurts whole lot Pain Location: R foot Pain Descriptors / Indicators: Grimacing;Guarding;Moaning Pain Intervention(s): Monitored during session;Repositioned  Home Living                                           Prior Functioning/Environment              Frequency  Min 2X/week        Progress Toward Goals  OT Goals(current goals can now be found in the care plan section)  Progress towards OT goals: Progressing toward goals  Acute Rehab OT Goals Patient Stated Goal: pt see family because she has not seen anyone in 4 days OT Goal Formulation: Patient unable to participate in goal setting Time For Goal Achievement: 05/07/18 Potential to Achieve Goals: Good ADL Goals Pt Will Perform Upper Body Bathing: sitting;with set-up Pt Will Perform Lower Body Bathing: with mod assist;sit to/from stand;sitting/lateral leans Pt Will Transfer to Toilet: with min guard assist;stand pivot transfer;bedside commode Additional ADL Goal #1: pt will complete bed mobility indep as precursor to adls.  Plan Discharge plan remains appropriate;Frequency remains appropriate    Co-evaluation                 AM-PAC OT "6 Clicks" Daily Activity     Outcome Measure   Help from another person eating meals?: None Help from another person taking care of personal grooming?: A Little Help from another person toileting, which includes using toliet, bedpan, or urinal?: A Lot Help from another person bathing (including washing, rinsing, drying)?: A Lot Help from another person to put on and taking off regular upper body clothing?: A Little Help from another person to put on and taking off regular lower body clothing?: A Lot 6 Click Score: 16    End of Session Equipment Utilized During Treatment: Rolling walker  OT Visit Diagnosis: Unsteadiness on feet (R26.81);Muscle weakness (generalized) (M62.81)   Activity Tolerance Patient tolerated treatment well   Patient Left with call bell/phone within reach;in bed;with bed alarm set   Nurse Communication Mobility status;Precautions;Weight bearing status        Time: 0034-9179 OT Time Calculation (min): 35  min  Charges: OT General Charges $OT Visit: 1 Visit OT Treatments $Self Care/Home Management : 23-37 mins  Minus Breeding, MSOT, OTR/L  Supplemental Rehabilitation Services  [phone]   Marius Ditch 04/24/2018, 3:43 PM

## 2018-04-24 NOTE — Progress Notes (Signed)
Inpatient Diabetes Program Recommendations  AACE/ADA: New Consensus Statement on Inpatient Glycemic Control (2015)  Target Ranges:  Prepandial:   less than 140 mg/dL      Peak postprandial:   less than 180 mg/dL (1-2 hours)      Critically ill patients:  140 - 180 mg/dL   Lab Results  Component Value Date   GLUCAP 143 (H) 04/24/2018   HGBA1C 13.5 (H) 04/17/2018    Review of Glycemic Control Results for Hannah, [name] (MRN [id number]) as of 04/24/2018 13:04  Ref. Range 04/23/2018 16:59 04/23/2018 20:03 04/24/2018 08:04 04/24/2018 11:29  Glucose-Capillary Latest Ref Range: 70 - 99 mg/dL 140 (H) 106 (H) 119 (H) 143 (H)   Diabetes history: Type 2 DM Outpatient Diabetes medications: Lantus 10 units QHS, Humalog 75/25 units BID, Sitagliptin-Metformin 50-1000 mg QD Current orders for Inpatient glycemic control: Novolog 0-9 units TID, Novolog 0-5 units QHS, Novolog 3 units TID, Lantus 20 units QHS  Inpatient Diabetes Program Recommendations:     Patient was not taking insulin as prescribed as patient has experienced significant decline (per family). However, did note in MD note "Home safety concerns PT OT evaluation recommended skilled nursing facility and difficulty with patient communication.   Attempted to speak with patient regarding DM management. Patient reported that she did not have what she needed because her Medicaid card was not working. Patient did not follow up with MD, communicated disconnected events, making what occurred over the last year difficult to understand. Briefly reviewed patient's current A1c of 13.5%. Explained what a A1c is and what it measures. Also reviewed goal A1c with patient, importance of good glucose control @ home, and blood sugar goals.  Informed of [employer] insulin from [facility]. Attempted to call her daughter in law who manages who medications at home, however, the number the patient provided went to another family member, who was also confused.  Attempted to call spouse, however no answer and unable to leave message.  In agreement with team, patient is unable to provide self diabetes care at this time and SNF/24 h care is appropriate. Case management consult already placed to help assist with medications at discharge. Will continue to follow.   Thanks, Bronson Curb, MSN, RNC-OB Diabetes Coordinator [phone] (8a-5p)

## 2018-04-24 NOTE — Progress Notes (Signed)
Physical Therapy Treatment Patient Details Name: [name] MRN: [id number] DOB: 1941/01/19 Today's Date: 04/24/2018    History of Present Illness 78 y.o. 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 Patient reports that she accidentally stepped on broken glass about 2 weeks ago. Underwent I&D with wound vac placement on 3/24 by Dr Sharol Given    PT Comments    Patient seen for mobility progression. Pt requires min A for sit to stand transfers and is able to maintain NWB status well. Pt is easily distracted and when pain increases with mobility is unable to follow cues for further mobility. Continue to progress as tolerated.     Follow Up Recommendations  SNF;Supervision/Assistance - 24 hour     Equipment Recommendations  None recommended by PT    Recommendations for Other Services       Precautions / Restrictions Precautions Precautions: Fall Restrictions Weight Bearing Restrictions: Yes RLE Weight Bearing: Non weight bearing    Mobility  Bed Mobility Overal bed mobility: Needs Assistance Bed Mobility: Supine to Sit;Sit to Supine     Supine to sit: Min guard Sit to supine: Min guard   General bed mobility comments: min guard for safety; use of rails and HOB elevated slightly  Transfers Overall transfer level: Needs assistance Equipment used: Rolling walker (2 wheeled) Transfers: Sit to/from Stand Sit to Stand: Min assist         General transfer comment: assist to steady; good maintenance of NWB status  Ambulation/Gait             General Gait Details: pt very distracted by pain and unable to follow cues for pivoting or taking a step due to increased pain with R LE in dependent position   Stairs             Wheelchair Mobility    Modified Rankin (Stroke Patients Only)        Balance Overall balance assessment: Needs assistance   Sitting balance-Leahy Scale: Good     Standing balance support: Bilateral upper extremity supported Standing balance-Leahy Scale: Poor                              Cognition Arousal/Alertness: Awake/alert Behavior During Therapy: Anxious Overall Cognitive Status: No family/caregiver present to determine baseline cognitive functioning Area of Impairment: Attention;Following commands;Safety/judgement;Problem solving                   Current Attention Level: Sustained   Following Commands: Follows one step commands inconsistently;Follows one step commands with increased time Safety/Judgement: Decreased awareness of safety;Decreased awareness of deficits   Problem Solving: Difficulty sequencing;Requires verbal cues General Comments: speaks in a very high pitch childlike voice most of the time; pt is very distracted by pain and does not follow commands when pain increases      Exercises      General Comments        Pertinent Vitals/Pain Pain Assessment: Faces Faces Pain Scale: Hurts even more Pain Location: R foot Pain Descriptors / Indicators: Grimacing;Guarding;Moaning Pain Intervention(s): Limited activity within patient's tolerance;Monitored during session;Repositioned    Home Living                      Prior Function  PT Goals (current goals can now be found in the care plan section) Progress towards PT goals: Progressing toward goals    Frequency    Min 2X/week      PT Plan Current plan remains appropriate    Co-evaluation              AM-PAC PT "6 Clicks" Mobility   Outcome Measure  Help needed turning from your back to your side while in a flat bed without using bedrails?: A Little Help needed moving from lying on your back to sitting on the side of a flat bed without using bedrails?: A Little Help needed moving to and from a bed to a chair  (including a wheelchair)?: A Little Help needed standing up from a chair using your arms (e.g., wheelchair or bedside chair)?: A Little Help needed to walk in hospital room?: Total Help needed climbing 3-5 steps with a railing? : Total 6 Click Score: 14    End of Session Equipment Utilized During Treatment: Gait belt Activity Tolerance: Patient tolerated treatment well Patient left: with call bell/phone within reach;in bed;with bed alarm set Nurse Communication: Mobility status PT Visit Diagnosis: Pain;Difficulty in walking, not elsewhere classified (R26.2) Pain - Right/Left: Right Pain - part of body: Ankle and joints of foot     Time: 1004-1026 PT Time Calculation (min) (ACUTE ONLY): 22 min  Charges:  $Therapeutic Activity: 8-22 mins                     Earney Navy, PTA Acute Rehabilitation Services Pager: [phone] Office: [phone]     Darliss Cheney 04/24/2018, 12:22 PM

## 2018-04-24 NOTE — Plan of Care (Signed)
Wound vac with sero sanguineous drainage, surgical site C/D/I

## 2018-04-24 NOTE — Plan of Care (Signed)
  Problem: Pain Managment: Goal: General experience of comfort will improve Outcome: Progressing   

## 2018-04-24 NOTE — Progress Notes (Signed)
[facility]                                                                              Patient Demographics  [name], is a 78 y.o. female, DOB - 09/03/1940, XYV:[id number]  Admit date - 04/20/2018   Admitting Physician Colbert Ewing, MD  Outpatient Primary MD for the patient is [name], [name], NP  Outpatient specialists:   LOS - 4  days   Medical records reviewed and are as summarized below:    Chief Complaint  Patient presents with   Right Foot / Ankle Pain       Brief summary   Jovonna Thorton [name] a 78 y.o.femalewithhx of, GERD, ovarian cancer s/p tumor resection in 2017, and uncontrolled DMT2 on insulin (currently not taking insulin) who presented with worsening R foot pain and swelling x 1 week. Patient reports that she accidentally stepped on broken glass about 2 weeks ago. Since then, noticed pain with weight bearing on the R foot and swelling of the foot and ankle. She was seen by outpatient podiatry on 3/19 where an XR showed soft tissue swelling and possible 57m foreign object in the superficial R plantar soft tissue.  She saw podiatry as an outpatient on 3/19 and plans were for patient to get an IND, received the block however did not wait for the doctor to come back, claims like she was never told how long she would have to wait and got upset for not being communicated with appropriately and left.  She was advised to go to the ED at that time but patient's husband brought her home instead.  Came back to the ER on 3/22 with worsening right foot pain and swelling.  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  Patient met sepsis physiology at the  time of admission with tachycardia, leukocytosis, source due to right foot infection -Patient had bedside I&D performed in ED on 3/22, x-ray showed persistent foreign body -Orthopedics was consulted, patient underwent right foot debridement and foreign body removal on 3/24, postop day #2.  Wound VAC in place. - Per Dr. 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 CMegan Salonrecommended continue Unasyn.   Diabetes mellitus type 2, uncontrolled, IDDM with complications including hyperglycemia, diabetic foot infection -CBGs stable, hemoglobin A1c on 3/19 was 13.5 -Continue Lantus 20 units daily, NovoLog meal coverage 3 units 3 times daily AC, sliding scale insulin sensitive  Hypokalemia Resolved  Essential hypertension Continue Norvasc, lisinopril held due to AKI at the time of admission N.p.o. after midnight, placed on hydralazine IV as needed with  parameters BP somewhat elevated, added scheduled hydralazine 10 mg 3 times daily  Mild acute kidney injury - Creatinine 1.4 on 3/24, slightly trending down  -Creatinine now improved,  Home safety concerns PT OT evaluation recommended skilled nursing facility  Code Status: Full code DVT Prophylaxis:  SCD's Family Communication: Discussed in detail with the patient, all imaging results, lab results explained to the patient    Disposition Plan: Await OR on 3/27, will need skilled nursing facility postop  Time Spent in minutes 25 minutes  Procedures:  PROCEDURE:  RIGHT FOOT DEBRIDEMENT AND FOREIGN BODY REMOVAL*  Consultants:   Orthopedics Infectious disease  Antimicrobials:   Anti-infectives  (From admission, onward)   Start     Dose/Rate Route Frequency Ordered Stop   04/23/18 1600  Ampicillin-Sulbactam (UNASYN) 3 g in sodium chloride 0.9 % 100 mL IVPB     3 g 200 mL/hr over 30 Minutes Intravenous Every 6 hours 04/23/18 1007     04/21/18 1800  vancomycin (VANCOCIN) 1,250 mg in sodium chloride 0.9 % 250 mL IVPB  Status:  Discontinued     1,250 mg 166.7 mL/hr over 90 Minutes Intravenous Every 24 hours 04/21/18 0851 04/21/18 1703   04/21/18 1800  Ampicillin-Sulbactam (UNASYN) 3 g in sodium chloride 0.9 % 100 mL IVPB  Status:  Discontinued     3 g 200 mL/hr over 30 Minutes Intravenous Every 8 hours 04/21/18 1749 04/23/18 1007   04/21/18 1000  cefTRIAXone (ROCEPHIN) 2 g in sodium chloride 0.9 % 100 mL IVPB  Status:  Discontinued     2 g 200 mL/hr over 30 Minutes Intravenous Every 24 hours 04/20/18 2025 04/20/18 2026   04/21/18 1000  metroNIDAZOLE (FLAGYL) IVPB 500 mg  Status:  Discontinued     500 mg 100 mL/hr over 60 Minutes Intravenous Every 8 hours 04/21/18 0824 04/21/18 1729   04/21/18 0600  cefTRIAXone (ROCEPHIN) 2 g in sodium chloride 0.9 % 100 mL IVPB  Status:  Discontinued     2 g 200 mL/hr over 30 Minutes Intravenous Every 24 hours 04/20/18 2026 04/21/18 1729   04/20/18 2100  vancomycin (VANCOCIN) 1,500 mg in sodium chloride 0.9 % 500 mL IVPB     1,500 mg 250 mL/hr over 120 Minutes Intravenous  Once 04/20/18 2009 04/20/18 2231   04/20/18 2000  piperacillin-tazobactam (ZOSYN) IVPB 3.375 g     3.375 g 100 mL/hr over 30 Minutes Intravenous  Once 04/20/18 1947 04/20/18 2031         Medications  Scheduled Meds:  amLODipine  5 mg Oral BID   docusate sodium  100 mg Oral BID   insulin aspart  0-5 Units Subcutaneous QHS   insulin aspart  0-9 Units Subcutaneous TID WC   insulin aspart  3 Units Subcutaneous TID WC   insulin glargine  20 Units Subcutaneous QHS   senna-docusate  1 tablet Oral QHS   Continuous Infusions:  sodium chloride 75 mL/hr at 04/23/18  1712   ampicillin-sulbactam (UNASYN) IV 3 g (04/24/18 0922)   methocarbamol (ROBAXIN) IV     sodium chloride     PRN Meds:.acetaminophen, bisacodyl, HYDROmorphone (DILAUDID) injection, magnesium citrate, methocarbamol **OR** methocarbamol (ROBAXIN) IV, morphine injection, ondansetron **OR** ondansetron (ZOFRAN) IV, oxyCODONE, oxyCODONE, polyethylene glycol      Subjective:   Andria Victorino was seen and examined today.  No complaints, BP elevated, no headache or blurry vision.  Patient denies dizziness, chest pain, shortness of breath, abdominal pain, N/V/D/C, new weakness, numbess, tingling.  No acute issues  overnight.  Objective:   Vitals:   04/23/18 1434 04/23/18 2100 04/23/18 2116 04/24/18 0352  BP: (!) 160/70 (!) 151/72 (!) 151/72 (!) 178/84  Pulse: 91 87 87 78  Resp: _0 Temp: 98.7 F (37.1 C) (!) 97.5 F (36.4 C) (!) 97.5 F (36.4 C) 98.1 F (36.7 C)  TempSrc: Oral Oral Oral Oral  SpO2: 95% 95% 95% 98%  Weight:      Height:        Intake/Output Summary (Last 24 hours) at 04/24/2018 0950 Last data filed at 04/24/2018 0503 Gross per 24 hour  Intake 2257.86 ml  Output 1500 ml  Net 757.86 ml     Wt Readings from Last 3 Encounters:  04/22/18 92 kg  04/17/18 92.8 kg  04/19/16 101.6 kg    Physical Exam  General: Alert and oriented x 3, NAD  Eyes: ,  HEENT:    Cardiovascular: S1 S2 clear,  RRR. No pedal edema b/l  Respiratory: CTAB, no wheezing, rales or rhonchi  Gastrointestinal: Soft, nontender, nondistended, NBS  Ext: no pedal edema bilaterally  Neuro: no new deficits  Musculoskeletal: No cyanosis, clubbing  Skin: Wound VAC right lower extremity with dressing  Psych: Normal affect and demeanor, alert and oriented x3     Data Reviewed:  I have personally reviewed following labs and imaging studies  Micro Results Recent Results (from the past 240 hour(s))  Blood culture (routine x 2)     Status: None (Preliminary result)    Collection Time: 04/20/18  7:49 PM  Result Value Ref Range Status   Specimen Description   Final    BLOOD LEFT FOREARM Performed at [facility], [address], [place], [place] [address]    Special Requests   Final    BOTTLES DRAWN AEROBIC AND ANAEROBIC Blood Culture results may not be optimal due to an excessive volume of blood received in culture bottles Performed at [facility] [address], [place], [place] [address]    Culture   Final    NO GROWTH 2 DAYS Performed at [facility] [address] [place], [place] [address]    Report Status PENDING  Incomplete  Wound or Superficial Culture     Status: None (Preliminary result)   Collection Time: 04/20/18  7:50 PM  Result Value Ref Range Status   Specimen Description   Final    ABSCESS Performed at [facility] [address] [place], [place] [address]    Special Requests   Final    Immunocompromised Performed at [facility], [place] [address] [place], [place] [address]    Gram Stain   Final    FEW WBC PRESENT, PREDOMINANTLY PMN MODERATE GRAM POSITIVE RODS FEW GRAM POSITIVE COCCI IN CHAINS RARE GRAM NEGATIVE RODS    Culture   Final    ABUNDANT GROUP B STREP(S.AGALACTIAE)ISOLATED TESTING AGAINST S. AGALACTIAE NOT ROUTINELY PERFORMED DUE TO PREDICTABILITY OF AMP/PEN/VAN SUSCEPTIBILITY. Performed at [facility], [place] [address], [place], [place] [address]    Report Status PENDING  Incomplete  Blood culture (routine x 2)     Status: Abnormal   Collection Time: 04/20/18  7:54 PM  Result Value Ref Range Status   Specimen Description   Final    BLOOD RIGHT FOREARM Performed at [facility], [place] [address] [place], [place] [address]    Special Requests   Final    BOTTLES DRAWN AEROBIC  AND ANAEROBIC Blood Culture adequate volume Performed at [facility] [address] [place], [place] [address]    Culture  Setup  Time   Final    GRAM POSITIVE COCCI IN BOTH AEROBIC AND ANAEROBIC BOTTLES CRITICAL RESULT CALLED TO, READ BACK BY AND VERIFIED WITHDiona Browner PHARMD 1645 04/21/18 A BROWNING Performed at [facility], [place] [address] [place], [place] [address]    Culture STAPHYLOCOCCUS AUREUS (A)  Final   Report Status 04/23/2018 FINAL  Final   Organism ID, Bacteria STAPHYLOCOCCUS AUREUS  Final      Susceptibility   Staphylococcus aureus - MIC*    CIPROFLOXACIN <=0.5 SENSITIVE Sensitive     ERYTHROMYCIN <=0.25 SENSITIVE Sensitive     GENTAMICIN <=0.5 SENSITIVE Sensitive     OXACILLIN <=0.25 SENSITIVE Sensitive     TETRACYCLINE <=1 SENSITIVE Sensitive     VANCOMYCIN <=0.5 SENSITIVE Sensitive     TRIMETH/SULFA <=10 SENSITIVE Sensitive     CLINDAMYCIN <=0.25 SENSITIVE Sensitive     RIFAMPIN <=0.5 SENSITIVE Sensitive     Inducible Clindamycin NEGATIVE Sensitive     * STAPHYLOCOCCUS AUREUS  Blood Culture ID Panel (Reflexed)     Status: Abnormal   Collection Time: 04/20/18  7:54 PM  Result Value Ref Range Status   Enterococcus species NOT DETECTED NOT DETECTED Final   Listeria monocytogenes NOT DETECTED NOT DETECTED Final   Staphylococcus species DETECTED (A) NOT DETECTED Final    Comment: CRITICAL RESULT CALLED TO, READ BACK BY AND VERIFIED WITH: T DANG PHARMD 1645 04/21/18 A BROWNING    Staphylococcus aureus (BCID) DETECTED (A) NOT DETECTED Final    Comment: Methicillin (oxacillin) susceptible Staphylococcus aureus (MSSA). Preferred therapy is anti staphylococcal beta lactam antibiotic (Cefazolin or Nafcillin), unless clinically contraindicated. CRITICAL RESULT CALLED TO, READ BACK BY AND VERIFIED WITH: T DANG PHARMD 1645 04/21/18 A BROWNING    Methicillin resistance NOT DETECTED NOT DETECTED Final   Streptococcus species NOT DETECTED NOT DETECTED Final   Streptococcus agalactiae NOT DETECTED NOT DETECTED Final   Streptococcus pneumoniae NOT DETECTED NOT DETECTED Final   Streptococcus pyogenes  NOT DETECTED NOT DETECTED Final   Acinetobacter baumannii NOT DETECTED NOT DETECTED Final   Enterobacteriaceae species NOT DETECTED NOT DETECTED Final   Enterobacter cloacae complex NOT DETECTED NOT DETECTED Final   Escherichia coli NOT DETECTED NOT DETECTED Final   Klebsiella oxytoca NOT DETECTED NOT DETECTED Final   Klebsiella pneumoniae NOT DETECTED NOT DETECTED Final   Proteus species NOT DETECTED NOT DETECTED Final   Serratia marcescens NOT DETECTED NOT DETECTED Final   Haemophilus influenzae NOT DETECTED NOT DETECTED Final   Neisseria meningitidis NOT DETECTED NOT DETECTED Final   Pseudomonas aeruginosa NOT DETECTED NOT DETECTED Final   Candida albicans NOT DETECTED NOT DETECTED Final   Candida glabrata NOT DETECTED NOT DETECTED Final   Candida krusei NOT DETECTED NOT DETECTED Final   Candida parapsilosis NOT DETECTED NOT DETECTED Final   Candida tropicalis NOT DETECTED NOT DETECTED Final    Comment: Performed at [facility], [place]. [address] [place], [place] [address]  Culture, blood (routine x 2)     Status: None (Preliminary result)   Collection Time: 04/21/18  6:45 PM  Result Value Ref Range Status   Specimen Description BLOOD LEFT ANTECUBITAL  Final   Special Requests   Final    BOTTLES DRAWN AEROBIC ONLY Blood Culture adequate volume   Culture   Final    NO GROWTH 2 DAYS Performed  at [facility], [place] [address], [place], [place] [address]    Report Status PENDING  Incomplete  Culture, blood (routine x 2)     Status: None (Preliminary result)   Collection Time: 04/21/18  6:55 PM  Result Value Ref Range Status   Specimen Description BLOOD LEFT ANTECUBITAL  Final   Special Requests   Final    BOTTLES DRAWN AEROBIC ONLY Blood Culture adequate volume   Culture   Final    NO GROWTH 2 DAYS Performed at [facility], [place] [address], [place], [place] [address]    Report Status PENDING  Incomplete  Aerobic/Anaerobic Culture (surgical/deep wound)      Status: None (Preliminary result)   Collection Time: 04/22/18  9:03 AM  Result Value Ref Range Status   Specimen Description TISSUE  Final   Special Requests RIGHT FOOT  Final   Gram Stain   Final    MODERATE WBC PRESENT, PREDOMINANTLY PMN MODERATE GRAM POSITIVE COCCI RARE GRAM POSITIVE RODS Performed at [facility], [place] [address] [place], [place] [address]    Culture   Final    MODERATE GROUP B STREP(S.AGALACTIAE)ISOLATED TESTING AGAINST S. AGALACTIAE NOT ROUTINELY PERFORMED DUE TO PREDICTABILITY OF AMP/PEN/VAN SUSCEPTIBILITY. NO ANAEROBES ISOLATED; CULTURE IN PROGRESS FOR 5 DAYS    Report Status PENDING  Incomplete  Surgical pcr screen     Status: Abnormal   Collection Time: 04/23/18  7:20 AM  Result Value Ref Range Status   MRSA, PCR NEGATIVE NEGATIVE Final   Staphylococcus aureus POSITIVE (A) NEGATIVE Final    Comment: (NOTE) The Xpert SA Assay (FDA approved for NASAL specimens in patients 75 years of age and older), is one component of a comprehensive surveillance program. It is not intended to diagnose infection nor to guide or monitor treatment. Performed at [facility], [place] [address], [place], [place] [address]     Radiology Reports Dg Foot Complete Right  Result Date: 04/20/2018 CLINICAL DATA:  Foot pain and swelling EXAM: RIGHT FOOT COMPLETE - 3+ VIEW COMPARISON:  04/17/2018 FINDINGS: Previously seen linear foreign body in the plantar soft tissues of the foot is again identified with appears slightly deeper. Adjacent soft tissue swelling is noted consistent with the given clinical history. No acute fracture is seen. Tarsal degenerative changes. IMPRESSION: No acute fractures noted. Previously seen foreign body is again identified slightly deeper in the plantar soft tissues. Electronically Signed   By: Inez Catalina M.D.   On: 04/20/2018 21:14   Dg Foot Complete Right  Result Date: 04/17/2018 CLINICAL DATA:  Stepped on glass with acute RIGHT foot pain.  Initial encounter. EXAM: RIGHT FOOT COMPLETE - 3+ VIEW COMPARISON:  None. FINDINGS: Diffuse soft tissue swelling noted. A faint possible 3 mm linear foreign body within the superficial plantar soft tissues noted at the level of the metatarsal bases. No acute fracture or dislocation. Mild degenerative changes in the hindfoot and midfoot noted. No suspicious focal bony lesions identified. IMPRESSION: Equivocal 3 mm linear foreign body within the superficial plantar soft tissues at the level of the metatarsal bases. Soft tissue swelling without acute bony abnormality. Electronically Signed   By: Margarette Canada M.D.   On: 04/17/2018 11:49   [name] Ekg Site Rite  Result Date: 04/23/2018 If Site Rite image not attached, placement could not be confirmed due to current cardiac rhythm.   Lab Data:  CBC: Recent Labs  Lab 04/20/18 1717 04/21/18 0410 04/22/18 0353 04/23/18 0329 04/24/18  0329  WBC 15.7* 15.1* 12.5* 12.0* 8.1  NEUTROABS 12.4* 11.7* 9.4* 9.7* 5.2  HGB 15.2* 13.3 12.9 12.0 12.4  HCT 42.7 39.1 35.9* 34.9* 35.7*  MCV 78.1* 80.8 77.5* 78.4* 77.6*  PLT 377 369 364 394 475*   Basic Metabolic Panel: Recent Labs  Lab 04/20/18 1717 04/21/18 0410 04/22/18 0353 04/23/18 0329 04/24/18 0329  NA 132* 136 137 136 140  K 2.8* 3.4* 3.5 3.5 3.5  CL 96* 104 104 103 107  CO2 24 21* _0 GLUCOSE 420* 291* 258* 223* 117*  BUN _1 CREATININE 0.91 1.07* 1.45* 1.40* 1.17*  CALCIUM 8.9 8.1* 8.4* 8.5* 9.0  MG  --  1.7 1.7 1.7 1.8   GFR: Estimated Creatinine Clearance: 45.4 mL/min (A) (by C-G formula based on SCr of 1.17 mg/dL (H)). Liver Function Tests: Recent Labs  Lab 04/17/18 1019 04/20/18 1717  AST 10 18  ALT 11 18  ALKPHOS 91 115  BILITOT 1.3* 0.9  PROT 7.6 8.4*  ALBUMIN 4.0 3.5   No results for input(s): LIPASE, AMYLASE in the last 168 hours. No results for input(s): AMMONIA in the last 168 hours. Coagulation Profile: No results for input(s): INR, PROTIME in the last  168 hours. Cardiac Enzymes: No results for input(s): CKTOTAL, CKMB, CKMBINDEX, TROPONINI in the last 168 hours. BNP (last 3 results) No results for input(s): PROBNP in the last 8760 hours. HbA1C: No results for input(s): HGBA1C in the last 72 hours. CBG: Recent Labs  Lab 04/23/18 0751 04/23/18 1134 04/23/18 1659 04/23/18 2003 04/24/18 0804  GLUCAP 171* 152* 140* 106* 119*   Lipid Profile: No results for input(s): CHOL, HDL, LDLCALC, TRIG, CHOLHDL, LDLDIRECT in the last 72 hours. Thyroid Function Tests: No results for input(s): TSH, T4TOTAL, FREET4, T3FREE, THYROIDAB in the last 72 hours. Anemia Panel: No results for input(s): VITAMINB12, FOLATE, FERRITIN, TIBC, IRON, RETICCTPCT in the last 72 hours. Urine analysis:    Component Value Date/Time   COLORURINE YELLOW 04/23/2018 0715   APPEARANCEUR HAZY (A) 04/23/2018 0715   APPEARANCEUR Hazy 07/26/2013 2055   LABSPEC 1.023 04/23/2018 0715   LABSPEC 1.014 07/26/2013 2055   PHURINE 5.0 04/23/2018 0715   GLUCOSEU 50 (A) 04/23/2018 0715   GLUCOSEU Negative 07/26/2013 2055   HGBUR SMALL (A) 04/23/2018 0715   BILIRUBINUR NEGATIVE 04/23/2018 0715   BILIRUBINUR Negative 07/26/2013 2055   KETONESUR NEGATIVE 04/23/2018 0715   PROTEINUR NEGATIVE 04/23/2018 0715   NITRITE NEGATIVE 04/23/2018 0715   LEUKOCYTESUR LARGE (A) 04/23/2018 0715   LEUKOCYTESUR 1+ 07/26/2013 2055     Shahd Occhipinti M.D. [facility] 04/24/2018, 9:50 AM  Pager: [phone] Between 7am to 7pm - call Pager - [phone]  After 7pm go to [email or web address] - password [email or web address]  Call night coverage person covering after 7pm

## 2018-04-24 NOTE — Progress Notes (Signed)
Patient ID: [name], female   DOB: Aug 19, 1940, 78 y.o.   MRN: [id number]  Patient is status post irrigation debridement of her right foot.  The installation wound VAC is functioning well.  Plan for return to the operating room tomorrow Friday for repeat debridement and possible application of split-thickness skin graft.

## 2018-04-24 NOTE — H&P (View-Only) (Signed)
Patient ID: [name], female   DOB: 03/27/1940, 78 y.o.   MRN: [id number]  Patient is status post irrigation debridement of her right foot.  The installation wound VAC is functioning well.  Plan for return to the operating room tomorrow Friday for repeat debridement and possible application of split-thickness skin graft.

## 2018-04-24 NOTE — Progress Notes (Signed)
Peripherally Inserted Central Catheter/Midline Placement  The IV Nurse has discussed with the patient and/or persons authorized to consent for the patient, the purpose of this procedure and the potential benefits and risks involved with this procedure.  The benefits include less needle sticks, lab draws from the catheter, and the patient may be discharged home with the catheter. Risks include, but not limited to, infection, bleeding, blood clot (thrombus formation), and puncture of an artery; nerve damage and irregular heartbeat and possibility to perform a PICC exchange if needed/ordered by physician.  Alternatives to this procedure were also discussed.  Bard Power PICC patient education guide, fact sheet on infection prevention and patient information card has been provided to patient /or left at bedside.    PICC/Midline Placement Documentation        [name] 04/24/2018, 10:49 AM

## 2018-04-24 NOTE — Progress Notes (Signed)
Endocarditis Evaluation   Infective endocarditis (IE) is associated with a high rate of complications and mortality.  Specific aspects of clinical management are critical to optimizing the outcome of patients with IE. Therefore, the Pharmacy Residency Program at [facility] has initiated an evidence-based consult aimed at improving the management of IE at [facility]     Comments  Consult to ID Outpatient follow-up appointment to be made prior to discharge.  Utilization of ID recommended antibiotic therapy   Surgery Evaluation Cardiothoracic surgery consult if the following indications are present:  - Heart failure associated to valve dysfunction - Endocarditis complicated by heart block or aortic abscess - Left-sided endocarditis caused by S. aureus, fungal, or multi-drug resistant organisms - Left-sided endocarditis with severe valve dysfunction - Large vegetation (>1 cm) on aortic or mitral valve - Persistence of infection 5-7 days after initiation of appropriate antibiotic therapy - Recurrent emboli and persistent vegetations despite appropriate antibiotic therapy - Any history with left-sided embolization with persistent vegetations on TEE  Consult electrophysiologist if presence of implanted cardiac device (pacemaker, ICD)   Consult to cardiology if left ventricular ejection fraction is <40% Evaluation for appropriate heart failure medications upon discharge: - ACE/ARB - Diuretic - Beta blocker  Outpatient follow-up appointment to be made prior to discharge.   Based on the pharmacist's evaluation of this patient, the following are recommended to complete a thorough evaluation and care plan to reflect guideline-recommended therapy:  1. Cardiothoracic surgery consult to evaluate aortic valve 2. ID follow up appointment to be made prior to discharge  Vertis Kelch, PharmD PGY1 Pharmacy Resident Phone [phone] 04/24/2018       11:10 AM

## 2018-04-24 NOTE — Progress Notes (Signed)
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 To discharging provider:  please sign these orders via discharge navigator,  Select New Orders & click on the button choice - Manage This Unsigned Work.     Thank you for allowing pharmacy to be a part of this patient's care.  Lorenda Ishihara 04/24/2018, 10:45 AM

## 2018-04-25 ENCOUNTER — Encounter: Payer: Self-pay | Admitting: Nurse Practitioner

## 2018-04-25 ENCOUNTER — Encounter (HOSPITAL_COMMUNITY)
Admission: EM | Disposition: A | Discharge status: Skilled Nursing Facility | Payer: Self-pay | Source: Home / Self Care | Attending: Internal Medicine

## 2018-04-25 ENCOUNTER — Encounter (HOSPITAL_COMMUNITY): Payer: Self-pay | Admitting: *Deleted

## 2018-04-25 ENCOUNTER — Inpatient Hospital Stay (HOSPITAL_COMMUNITY): Payer: Medicare Other | Admitting: Anesthesiology

## 2018-04-25 DIAGNOSIS — Z95828 Presence of other vascular implants and grafts: Secondary | ICD-10-CM

## 2018-04-25 HISTORY — PX: APPLICATION OF WOUND VAC: SHX5189

## 2018-04-25 HISTORY — PX: SKIN SPLIT GRAFT: SHX444

## 2018-04-25 LAB — GLUCOSE, CAPILLARY
Glucose-Capillary: 102 mg/dL — ABNORMAL HIGH (ref 70–99)
Glucose-Capillary: 137 mg/dL — ABNORMAL HIGH (ref 70–99)
Glucose-Capillary: 136 mg/dL — ABNORMAL HIGH (ref 70–99)
Glucose-Capillary: 176 mg/dL — ABNORMAL HIGH (ref 70–99)
Glucose-Capillary: 171 mg/dL — ABNORMAL HIGH (ref 70–99)

## 2018-04-25 LAB — BASIC METABOLIC PANEL
Anion gap: 13 (ref 5–15)
BUN: 5 mg/dL — ABNORMAL LOW (ref 8–23)
CO2: 28 mmol/L (ref 22–32)
Calcium: 9.4 mg/dL (ref 8.9–10.3)
Chloride: 102 mmol/L (ref 98–111)
Creatinine, Ser: 0.93 mg/dL (ref 0.44–1.00)
GFR calc Af Amer: >60 mL/min (ref >60)
GFR calc non Af Amer: 60 mL/min — ABNORMAL LOW (ref >60)
Glucose, Bld: 113 mg/dL — ABNORMAL HIGH (ref 70–99)
Potassium: 3.1 mmol/L — ABNORMAL LOW (ref 3.5–5.1)
Sodium: 143 mmol/L (ref 135–145)

## 2018-04-25 LAB — CBC WITH DIFFERENTIAL/PLATELET
Abs Immature Granulocytes: 0.05 10*3/uL (ref 0.00–0.07)
Basophils Absolute: 0 10*3/uL (ref 0.0–0.1)
Basophils Relative: 1 %
Eosinophils Absolute: 0.5 10*3/uL (ref 0.0–0.5)
Eosinophils Relative: 6 %
HCT: 36.4 % (ref 36.0–46.0)
Hemoglobin: 13.1 g/dL (ref 12.0–15.0)
Immature Granulocytes: 1 %
Lymphocytes Relative: 20 %
Lymphs Abs: 1.6 10*3/uL (ref 0.7–4.0)
MCH: 28.1 pg (ref 26.0–34.0)
MCHC: 36 g/dL (ref 30.0–36.0)
MCV: 78.1 fL — ABNORMAL LOW (ref 80.0–100.0)
MONO ABS: 0.8 10*3/uL (ref 0.1–1.0)
Monocytes Relative: 10 %
Neutro Abs: 4.8 10*3/uL (ref 1.7–7.7)
Neutrophils Relative %: 62 %
Platelets: 455 10*3/uL — ABNORMAL HIGH (ref 150–400)
RBC: 4.66 MIL/uL (ref 3.87–5.11)
RDW: 13.4 % (ref 11.5–15.5)
WBC: 7.6 10*3/uL (ref 4.0–10.5)
nRBC: 0 % (ref 0.0–0.2)

## 2018-04-25 LAB — MRSA PCR SCREENING: MRSA by PCR: NEGATIVE

## 2018-04-25 LAB — MAGNESIUM: Magnesium: 1.6 mg/dL — ABNORMAL LOW (ref 1.7–2.4)

## 2018-04-25 SURGERY — APPLICATION, GRAFT, SKIN, SPLIT-THICKNESS
Anesthesia: General | Site: Leg Lower | Laterality: Right

## 2018-04-25 MED ORDER — SODIUM CHLORIDE 0.9 % IV SOLN
INTRAVENOUS | Status: DC
  Administered 2018-04-25: 11:00:00 via INTRAVENOUS

## 2018-04-25 MED ORDER — MAGNESIUM SULFATE 2 GM/50ML IV SOLN
2.0000 g | Freq: Once | INTRAVENOUS | Status: AC
  Administered 2018-04-25: 2 g via INTRAVENOUS
  Filled 2018-04-25: qty 50

## 2018-04-25 MED ORDER — METOCLOPRAMIDE HCL 5 MG PO TABS: 5.0000 mg | ORAL_TABLET | Freq: Three times a day (TID) | ORAL | Status: DC | PRN

## 2018-04-25 MED ORDER — PROPOFOL 10 MG/ML IV BOLUS
INTRAVENOUS | Status: DC | PRN
  Administered 2018-04-25: 110 mg via INTRAVENOUS

## 2018-04-25 MED ORDER — METHOCARBAMOL 1000 MG/10ML IJ SOLN: 500.0000 mg | Freq: Four times a day (QID) | INTRAVENOUS | Status: DC | PRN

## 2018-04-25 MED ORDER — LIDOCAINE 2% (20 MG/ML) 5 ML SYRINGE
INTRAMUSCULAR | Status: AC
  Filled 2018-04-25: qty 5

## 2018-04-25 MED ORDER — ONDANSETRON HCL 4 MG/2ML IJ SOLN
INTRAMUSCULAR | Status: AC
  Filled 2018-04-25: qty 2

## 2018-04-25 MED ORDER — METHOCARBAMOL 500 MG PO TABS: 500.0000 mg | ORAL_TABLET | Freq: Four times a day (QID) | ORAL | Status: DC | PRN

## 2018-04-25 MED ORDER — ONDANSETRON HCL 4 MG/2ML IJ SOLN
INTRAMUSCULAR | Status: DC | PRN
  Administered 2018-04-25: 4 mg via INTRAVENOUS

## 2018-04-25 MED ORDER — 0.9 % SODIUM CHLORIDE (POUR BTL) OPTIME
TOPICAL | Status: DC | PRN
  Administered 2018-04-25 (×3): 1000 mL

## 2018-04-25 MED ORDER — MAGNESIUM CITRATE PO SOLN: 1.0000 | Freq: Once | ORAL | Status: DC | PRN

## 2018-04-25 MED ORDER — CEFAZOLIN SODIUM-DEXTROSE 2-4 GM/100ML-% IV SOLN
2.0000 g | INTRAVENOUS | Status: AC
  Administered 2018-04-25: 2 g via INTRAVENOUS
  Filled 2018-04-25: qty 100

## 2018-04-25 MED ORDER — FENTANYL CITRATE (PF) 250 MCG/5ML IJ SOLN
INTRAMUSCULAR | Status: AC
  Filled 2018-04-25: qty 5

## 2018-04-25 MED ORDER — SUCCINYLCHOLINE CHLORIDE 200 MG/10ML IV SOSY
PREFILLED_SYRINGE | INTRAVENOUS | Status: DC | PRN
  Administered 2018-04-25: 100 mg via INTRAVENOUS

## 2018-04-25 MED ORDER — ONDANSETRON HCL 4 MG PO TABS: 4.0000 mg | ORAL_TABLET | Freq: Four times a day (QID) | ORAL | Status: DC | PRN

## 2018-04-25 MED ORDER — POLYETHYLENE GLYCOL 3350 17 G PO PACK: 17.0000 g | PACK | Freq: Every day | ORAL | Status: DC | PRN

## 2018-04-25 MED ORDER — CHLORHEXIDINE GLUCONATE 4 % EX LIQD: 60.0000 mL | Freq: Once | CUTANEOUS | Status: DC

## 2018-04-25 MED ORDER — LIDOCAINE 2% (20 MG/ML) 5 ML SYRINGE
INTRAMUSCULAR | Status: DC | PRN
  Administered 2018-04-25: 60 mg via INTRAVENOUS

## 2018-04-25 MED ORDER — FENTANYL CITRATE (PF) 250 MCG/5ML IJ SOLN
INTRAMUSCULAR | Status: DC | PRN
  Administered 2018-04-25 (×2): 50 ug via INTRAVENOUS

## 2018-04-25 MED ORDER — FENTANYL CITRATE (PF) 100 MCG/2ML IJ SOLN
25.0000 ug | INTRAMUSCULAR | Status: DC | PRN
  Administered 2018-04-25 (×2): 50 ug via INTRAVENOUS

## 2018-04-25 MED ORDER — ONDANSETRON HCL 4 MG/2ML IJ SOLN: 4.0000 mg | Freq: Four times a day (QID) | INTRAMUSCULAR | Status: DC | PRN

## 2018-04-25 MED ORDER — POTASSIUM CHLORIDE CRYS ER 20 MEQ PO TBCR
40.0000 meq | EXTENDED_RELEASE_TABLET | Freq: Once | ORAL | Status: AC
  Administered 2018-04-25: 40 meq via ORAL
  Filled 2018-04-25: qty 2

## 2018-04-25 MED ORDER — FENTANYL CITRATE (PF) 100 MCG/2ML IJ SOLN
INTRAMUSCULAR | Status: AC
  Filled 2018-04-25: qty 2

## 2018-04-25 MED ORDER — DEXAMETHASONE SODIUM PHOSPHATE 10 MG/ML IJ SOLN
INTRAMUSCULAR | Status: DC | PRN
  Administered 2018-04-25: 10 mg via INTRAVENOUS

## 2018-04-25 MED ORDER — LACTATED RINGERS IV SOLN
INTRAVENOUS | Status: DC | PRN
  Administered 2018-04-25: 08:00:00 via INTRAVENOUS

## 2018-04-25 MED ORDER — BISACODYL 10 MG RE SUPP: 10.0000 mg | Freq: Every day | RECTAL | Status: DC | PRN

## 2018-04-25 MED ORDER — OXYCODONE HCL 5 MG PO TABS
ORAL_TABLET | ORAL | Status: AC
  Filled 2018-04-25: qty 2

## 2018-04-25 MED ORDER — DOCUSATE SODIUM 100 MG PO CAPS: 100.0000 mg | ORAL_CAPSULE | Freq: Two times a day (BID) | ORAL | Status: DC

## 2018-04-25 MED ORDER — METOCLOPRAMIDE HCL 5 MG/ML IJ SOLN: 5.0000 mg | Freq: Three times a day (TID) | INTRAMUSCULAR | Status: DC | PRN

## 2018-04-25 MED ORDER — LACTATED RINGERS IV SOLN
INTRAVENOUS | Status: DC
  Administered 2018-04-25: 08:00:00 via INTRAVENOUS

## 2018-04-25 SURGICAL SUPPLY — 50 items
ALLOGRAFT SKIN MESHD 125 SQ CM (Tissue) ×2 IMPLANT
BNDG CMPR 9X4 STRL LF SNTH (GAUZE/BANDAGES/DRESSINGS) ×2
BNDG COHESIVE 4X5 TAN STRL (GAUZE/BANDAGES/DRESSINGS) ×2 IMPLANT
BNDG COHESIVE 6X5 TAN STRL LF (GAUZE/BANDAGES/DRESSINGS) IMPLANT
BNDG ESMARK 4X9 LF (GAUZE/BANDAGES/DRESSINGS) ×4 IMPLANT
BNDG GAUZE STRTCH 6 (GAUZE/BANDAGES/DRESSINGS) IMPLANT
CANISTER WOUNDNEG PRESSURE 500 (CANNISTER) ×2 IMPLANT
COVER SURGICAL LIGHT HANDLE (MISCELLANEOUS) ×6 IMPLANT
COVER WAND RF STERILE (DRAPES) ×4 IMPLANT
CUFF TOURNIQUET SINGLE 18IN (TOURNIQUET CUFF) IMPLANT
CUFF TOURNIQUET SINGLE 24IN (TOURNIQUET CUFF) IMPLANT
DERMACARRIERS GRAFT 1 TO 1.5 (DISPOSABLE)
DRAPE U-SHAPE 47X51 STRL (DRAPES) ×4 IMPLANT
DRESSING VERAFLO CLEANSE CC (GAUZE/BANDAGES/DRESSINGS) IMPLANT
DRSG ADAPTIC 3X8 NADH LF (GAUZE/BANDAGES/DRESSINGS) IMPLANT
DRSG MEPITEL 4X7.2 (GAUZE/BANDAGES/DRESSINGS) ×4 IMPLANT
DRSG VERAFLO CLEANSE CC (GAUZE/BANDAGES/DRESSINGS) ×4
DURAPREP 26ML APPLICATOR (WOUND CARE) ×4 IMPLANT
ELECT REM PT RETURN 9FT ADLT (ELECTROSURGICAL) ×4
ELECTRODE REM PT RTRN 9FT ADLT (ELECTROSURGICAL) ×2 IMPLANT
GAUZE SPONGE 4X4 12PLY STRL (GAUZE/BANDAGES/DRESSINGS) IMPLANT
GLOVE BIOGEL PI IND STRL 9 (GLOVE) ×2 IMPLANT
GLOVE BIOGEL PI INDICATOR 9 (GLOVE) ×2
GLOVE SURG ORTHO 9.0 STRL STRW (GLOVE) ×4 IMPLANT
GOWN STRL REUS W/ TWL XL LVL3 (GOWN DISPOSABLE) ×4 IMPLANT
GOWN STRL REUS W/TWL XL LVL3 (GOWN DISPOSABLE) ×6
GRAFT DERMACARRIERS 1 TO 1.5 (DISPOSABLE) IMPLANT
GRAFT TISS MESH 125 BURN (Tissue) IMPLANT
KIT BASIN OR (CUSTOM PROCEDURE TRAY) ×4 IMPLANT
KIT TURNOVER KIT B (KITS) ×4 IMPLANT
MANIFOLD NEPTUNE II (INSTRUMENTS) ×4 IMPLANT
NDL HYPO 25GX1X1/2 BEV (NEEDLE) IMPLANT
NEEDLE HYPO 25GX1X1/2 BEV (NEEDLE) IMPLANT
NS IRRIG 1000ML POUR BTL (IV SOLUTION) ×4 IMPLANT
PACK ORTHO EXTREMITY (CUSTOM PROCEDURE TRAY) ×4 IMPLANT
PAD ARMBOARD 7.5X6 YLW CONV (MISCELLANEOUS) ×8 IMPLANT
PAD CAST 4YDX4 CTTN HI CHSV (CAST SUPPLIES) IMPLANT
PAD NEG PRESSURE SENSATRAC (MISCELLANEOUS) ×2 IMPLANT
PADDING CAST COTTON 4X4 STRL (CAST SUPPLIES)
SKIN MESHED 125 SQ CM (Tissue) ×4 IMPLANT
STAPLER VISISTAT 35W (STAPLE) ×2 IMPLANT
SUCTION FRAZIER HANDLE 10FR (MISCELLANEOUS)
SUCTION TUBE FRAZIER 10FR DISP (MISCELLANEOUS) IMPLANT
SUT ETHILON 4 0 PS 2 18 (SUTURE) IMPLANT
SYR CONTROL 10ML LL (SYRINGE) IMPLANT
TOWEL OR 17X24 6PK STRL BLUE (TOWEL DISPOSABLE) ×4 IMPLANT
TOWEL OR 17X26 10 PK STRL BLUE (TOWEL DISPOSABLE) ×4 IMPLANT
TUBE CONNECTING 12'X1/4 (SUCTIONS)
TUBE CONNECTING 12X1/4 (SUCTIONS) IMPLANT
WATER STERILE IRR 1000ML POUR (IV SOLUTION) ×4 IMPLANT

## 2018-04-25 NOTE — Plan of Care (Signed)
  Problem: Elimination: Goal: Will not experience complications related to bowel motility Outcome: Progressing   

## 2018-04-25 NOTE — Progress Notes (Signed)
Patient ID: [name], female   DOB: 07-Apr-1940, 78 y.o.   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  I plan on 2 weeks of ampicillin sulbactam for her polymicrobial foot infection followed by an additional 4 weeks of IV cefazolin to complete therapy for her MSSA bacteremia and endocarditis.  PLAN: 1. Continue ampicillin sulbactam  2. I will sign off now  Diagnosis: Right foot infection, bacteremia and endocarditis  Culture Result: MSSA bacteremia and polymicrobial foot infection  No Known Allergies  OPAT Orders Discharge antibiotics: Per pharmacy protocol ampicillin sulbactam for 2 weeks followed by cefazolin for 4 weeks  Duration: 6 weeks End Date: Ampicillin sulbactam 05/05/2018 Cefazolin 06/02/2018  PIC Care Per Protocol:  Labs weekly while on IV antibiotics: _x_ CBC with differential _x_ BMP __ CMP __ CRP __ ESR __ Vancomycin trough __ CK  _x_ Please pull PIC at completion of IV antibiotics __ Please leave PIC in place until doctor has seen patient or been notified  Fax weekly labs to [phone]  Clinic Follow Up Appt: 05/2018  Principal Problem:   Bacteremia due to methicillin susceptible Staphylococcus aureus (MSSA) Active Problems:   Foreign body in right foot   Diabetic foot infection ([place])   Abscess of foot   Aortic valve endocarditis   Uncontrolled diabetes mellitus ([place])   AKI (acute kidney injury) ([place])   Scheduled Meds: . amLODipine  5 mg Oral BID  . docusate sodium  100 mg Oral BID  . fentaNYL      . hydrALAZINE  10 mg Oral Q8H  . insulin aspart  0-5 Units Subcutaneous QHS  . insulin aspart  0-9 Units Subcutaneous TID WC  . insulin aspart  3  Units Subcutaneous TID WC  . insulin glargine  20 Units Subcutaneous QHS  . oxyCODONE      . senna-docusate  1 tablet Oral QHS   Continuous Infusions: . sodium chloride 10 mL/hr at 04/25/18 1050  . ampicillin-sulbactam (UNASYN) IV 3 g (04/25/18 1049)  . lactated ringers 10 mL/hr at 04/25/18 0738  . methocarbamol (ROBAXIN) IV    . sodium chloride     PRN Meds:.acetaminophen, bisacodyl, hydrALAZINE, HYDROmorphone (DILAUDID) injection, magnesium citrate, methocarbamol **OR** methocarbamol (ROBAXIN) IV, morphine injection, ondansetron **OR** ondansetron (ZOFRAN) IV, oxyCODONE, oxyCODONE, polyethylene glycol, sodium chloride flush   SUBJECTIVE: She has had her PICC placed.  She underwent further debridement of her right foot and had a split thickness skin graft placed this morning.  She is agreeable to going to a skilled nursing facility to complete her 6 weeks of IV antibiotic therapy.  Review of Systems: Review of Systems  Constitutional: Negative for chills, diaphoresis and fever.  Musculoskeletal: Negative for joint pain.    No Known Allergies  OBJECTIVE: Vitals:   04/25/18 0950 04/25/18 0955 04/25/18 1000 04/25/18 1045  BP:  (!) 159/78 (!) 159/78 (!) 177/95  Pulse: 94 92 89 90  Resp: 18 18 12 20   Temp:    98 F (36.7 C)  TempSrc:    Oral  SpO2: 93% 96% 94% 96%  Weight:      Height:  Body mass index is 34.28 kg/m.  Physical Exam Constitutional:      Comments: She is talkative and in good spirits.    Cardiovascular:     Rate and Rhythm: Normal rate and regular rhythm.     Heart sounds: No murmur.  Pulmonary:     Effort: Pulmonary effort is normal.     Breath sounds: Normal breath sounds.  Musculoskeletal:     Comments: Her right foot and lower leg are wrapped.   Psychiatric:        Mood and Affect: Mood normal.     Lab Results Lab Results  Component Value Date   WBC 7.6 04/25/2018   HGB 13.1 04/25/2018   HCT 36.4 04/25/2018   MCV 78.1 (L) 04/25/2018    PLT 455 (H) 04/25/2018    Lab Results  Component Value Date   CREATININE 0.93 04/25/2018   BUN 5 (L) 04/25/2018   NA 143 04/25/2018   K 3.1 (L) 04/25/2018   CL 102 04/25/2018   CO2 28 04/25/2018    Lab Results  Component Value Date   ALT 18 04/20/2018   AST 18 04/20/2018   ALKPHOS 115 04/20/2018   BILITOT 0.9 04/20/2018     Microbiology: Recent Results (from the past 240 hour(s))  Blood culture (routine x 2)     Status: None (Preliminary result)   Collection Time: 04/20/18  7:49 PM  Result Value Ref Range Status   Specimen Description   Final    BLOOD LEFT FOREARM Performed at [facility], [place] [address] [place], [place] [address]    Special Requests   Final    BOTTLES DRAWN AEROBIC AND ANAEROBIC Blood Culture results may not be optimal due to an excessive volume of blood received in culture bottles Performed at [facility] [address], [place], [place] [address]    Culture   Final    NO GROWTH 4 DAYS Performed at [facility], [place] [address], [place], [place] [address]    Report Status PENDING  Incomplete  Wound or Superficial Culture     Status: None   Collection Time: 04/20/18  7:50 PM  Result Value Ref Range Status   Specimen Description   Final    ABSCESS Performed at [facility] [address] [place], [place] [address]    Special Requests   Final    Immunocompromised Performed at [facility], [place] [address], [place], [place] [address]    Gram Stain   Final    FEW WBC PRESENT, PREDOMINANTLY PMN MODERATE GRAM POSITIVE RODS FEW GRAM POSITIVE COCCI IN CHAINS RARE GRAM NEGATIVE RODS    Culture   Final    ABUNDANT GROUP B STREP(S.AGALACTIAE)ISOLATED TESTING AGAINST S. AGALACTIAE NOT ROUTINELY PERFORMED DUE TO PREDICTABILITY OF AMP/PEN/VAN SUSCEPTIBILITY. FEW LACTOBACILLUS SPECIES Standardized susceptibility testing for this organism is not available. Performed at [facility], [place] [address] [place], [place] [address]    Report Status 04/24/2018 FINAL  Final  Blood culture (routine x 2)     Status: Abnormal   Collection Time: 04/20/18  7:54 PM  Result Value Ref Range Status   Specimen Description   Final    BLOOD RIGHT FOREARM Performed at [facility], [place] [address], [place],  [address]    Special Requests   Final    BOTTLES DRAWN AEROBIC AND ANAEROBIC Blood Culture adequate volume Performed at [facility], [address] [place], [place] [address]    Culture  Setup Time   Final    GRAM POSITIVE COCCI IN BOTH AEROBIC AND ANAEROBIC BOTTLES CRITICAL RESULT CALLED TO, READ BACK BY AND VERIFIED WITHDiona Browner PHARMD 1645 04/21/18 A BROWNING Performed at [facility], [place] [address] [place], [place] [address]    Culture STAPHYLOCOCCUS AUREUS (A)  Final   Report Status 04/23/2018 FINAL  Final   Organism ID, Bacteria STAPHYLOCOCCUS AUREUS  Final      Susceptibility   Staphylococcus aureus - MIC*    CIPROFLOXACIN <=0.5 SENSITIVE Sensitive     ERYTHROMYCIN <=0.25 SENSITIVE Sensitive     GENTAMICIN <=0.5 SENSITIVE Sensitive     OXACILLIN <=0.25 SENSITIVE Sensitive     TETRACYCLINE <=1 SENSITIVE Sensitive     VANCOMYCIN <=0.5 SENSITIVE Sensitive     TRIMETH/SULFA <=10 SENSITIVE Sensitive     CLINDAMYCIN <=0.25 SENSITIVE Sensitive     RIFAMPIN <=0.5 SENSITIVE Sensitive     Inducible Clindamycin NEGATIVE Sensitive     * STAPHYLOCOCCUS AUREUS  Blood Culture ID Panel (Reflexed)     Status: Abnormal   Collection Time: 04/20/18  7:54 PM  Result Value Ref Range Status   Enterococcus species NOT DETECTED NOT DETECTED Final   Listeria monocytogenes NOT DETECTED NOT DETECTED Final   Staphylococcus species DETECTED (A) NOT DETECTED Final    Comment: CRITICAL RESULT CALLED TO, READ BACK BY AND VERIFIED WITH: T DANG PHARMD 1645 04/21/18 A BROWNING    Staphylococcus aureus (BCID) DETECTED (A) NOT DETECTED Final     Comment: Methicillin (oxacillin) susceptible Staphylococcus aureus (MSSA). Preferred therapy is anti staphylococcal beta lactam antibiotic (Cefazolin or Nafcillin), unless clinically contraindicated. CRITICAL RESULT CALLED TO, READ BACK BY AND VERIFIED WITH: T DANG PHARMD 1645 04/21/18 A BROWNING    Methicillin resistance NOT DETECTED NOT DETECTED Final   Streptococcus species NOT DETECTED NOT DETECTED Final   Streptococcus agalactiae NOT DETECTED NOT DETECTED Final   Streptococcus pneumoniae NOT DETECTED NOT DETECTED Final   Streptococcus pyogenes NOT DETECTED NOT DETECTED Final   Acinetobacter baumannii NOT DETECTED NOT DETECTED Final   Enterobacteriaceae species NOT DETECTED NOT DETECTED Final   Enterobacter cloacae complex NOT DETECTED NOT DETECTED Final   Escherichia coli NOT DETECTED NOT DETECTED Final   Klebsiella oxytoca NOT DETECTED NOT DETECTED Final   Klebsiella pneumoniae NOT DETECTED NOT DETECTED Final   Proteus species NOT DETECTED NOT DETECTED Final   Serratia marcescens NOT DETECTED NOT DETECTED Final   Haemophilus influenzae NOT DETECTED NOT DETECTED Final   Neisseria meningitidis NOT DETECTED NOT DETECTED Final   Pseudomonas aeruginosa NOT DETECTED NOT DETECTED Final   Candida albicans NOT DETECTED NOT DETECTED Final   Candida glabrata NOT DETECTED NOT DETECTED Final   Candida krusei NOT DETECTED NOT DETECTED Final   Candida parapsilosis NOT DETECTED NOT DETECTED Final   Candida tropicalis NOT DETECTED NOT DETECTED Final    Comment: Performed at [facility], [place]. [address], [place], [place] [address]  Culture, blood (routine x 2)     Status: None (Preliminary result)   Collection Time: 04/21/18  6:45 PM  Result Value Ref Range Status   Specimen Description BLOOD LEFT ANTECUBITAL  Final   Special Requests   Final    BOTTLES DRAWN AEROBIC ONLY Blood Culture adequate volume   Culture   Final    NO GROWTH 4 DAYS Performed at [facility], [place] [address], [place], [place] [address]  Report Status PENDING  Incomplete  Culture, blood (routine x 2)     Status: None (Preliminary result)   Collection Time: 04/21/18  6:55 PM  Result Value Ref Range Status   Specimen Description BLOOD LEFT ANTECUBITAL  Final   Special Requests   Final    BOTTLES DRAWN AEROBIC ONLY Blood Culture adequate volume   Culture   Final    NO GROWTH 4 DAYS Performed at [facility], [address] [place], [place] [address]    Report Status PENDING  Incomplete  Aerobic/Anaerobic Culture (surgical/deep wound)     Status: None (Preliminary result)   Collection Time: 04/22/18  9:03 AM  Result Value Ref Range Status   Specimen Description TISSUE  Final   Special Requests RIGHT FOOT  Final   Gram Stain   Final    MODERATE WBC PRESENT, PREDOMINANTLY PMN MODERATE GRAM POSITIVE COCCI RARE GRAM POSITIVE RODS Performed at [facility], [place] [address], [place], [place] [address]    Culture   Final    MODERATE GROUP B STREP(S.AGALACTIAE)ISOLATED TESTING AGAINST S. AGALACTIAE NOT ROUTINELY PERFORMED DUE TO PREDICTABILITY OF AMP/PEN/VAN SUSCEPTIBILITY. NO ANAEROBES ISOLATED; CULTURE IN PROGRESS FOR 5 DAYS    Report Status PENDING  Incomplete  Surgical pcr screen     Status: Abnormal   Collection Time: 04/23/18  7:20 AM  Result Value Ref Range Status   MRSA, PCR NEGATIVE NEGATIVE Final   Staphylococcus aureus POSITIVE (A) NEGATIVE Final    Comment: (NOTE) The Xpert SA Assay (FDA approved for NASAL specimens in patients 41 years of age and older), is one component of a comprehensive surveillance program. It is not intended to diagnose infection nor to guide or monitor treatment. Performed at [facility], [place] [address], [place], [place] [address]   MRSA PCR Screening     Status: None   Collection Time: 04/25/18  3:47 AM  Result Value Ref Range Status   MRSA by PCR NEGATIVE NEGATIVE Final    Comment:        The GeneXpert MRSA Assay (FDA approved  for NASAL specimens only), is one component of a comprehensive MRSA colonization surveillance program. It is not intended to diagnose MRSA infection nor to guide or monitor treatment for MRSA infections. Performed at [facility], [place] [address], [place], [place] [address]     Michel Bickers, [facility] [phone] pager   [phone] cell 04/25/2018, 11:48 AM

## 2018-04-25 NOTE — NC FL2 (Signed)
[place] LEVEL OF CARE SCREENING TOOL     IDENTIFICATION  Patient Name: [name] Birthdate: 23-Nov-1940 Sex: female Admission Date (Current Location): 04/20/2018  [facility] and [place] Number:  Herbalist and Address:  [facility] [place], [place] [address], [place], [place] [address]      Provider Number: [id number]  Attending Physician Name and Address:  Mendel Corning, MD  Relative Name and Phone Number:  Juanda Crumble (spouse) [phone]    Current Level of Care: Hospital Recommended Level of Care: [place] Prior Approval Number:    Date Approved/Denied: 04/25/15 PASRR Number: [id number]  Discharge Plan: SNF    Current Diagnoses: Patient Active Problem List   Diagnosis Date Noted  . 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 AKI (acute kidney injury) ([place])   . Bacteremia due to methicillin susceptible Staphylococcus aureus (MSSA) 04/21/2018  . Abscess of foot   . Diabetic foot infection ([place]) 04/20/2018  . Iron deficiency 04/18/2018  . Foreign body in right foot 04/18/2018  . Cellulitis of right leg 04/18/2018  . Uncontrolled diabetes mellitus ([place]) 04/12/2016  . Vitamin D deficiency 03/06/2016  . 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 Essential hypertension 03/21/2015  . Vision decreased 03/21/2015  . Tension-type headache, not intractable 03/21/2015  . Anemia due to chronic blood loss 03/16/2015  . Morbid obesity ([place]) 02/02/2015  . Anemia 01/30/2015    Orientation RESPIRATION BLADDER Height & Weight     Self, Time, Situation, Place  Normal Incontinent, External catheter Weight: 202 lb 13.2 oz (92 kg) Height:  5' 4.5" (163.8 cm)  BEHAVIORAL SYMPTOMS/MOOD NEUROLOGICAL BOWEL NUTRITION STATUS      Continent Diet(see dc summary)  AMBULATORY STATUS COMMUNICATION OF NEEDS Skin   Limited Assist Verbally Other (Comment), Surgical wounds(right foot closed surgical incision with negative pressure wound  therapy)                       Personal Care Assistance Level of Assistance  Bathing, Feeding, Dressing, Total care Bathing Assistance: Limited assistance Feeding assistance: Independent Dressing Assistance: Limited assistance Total Care Assistance: Limited assistance   Functional Limitations Info  Sight, Hearing, Speech Sight Info: Adequate Hearing Info: Adequate Speech Info: Adequate    SPECIAL CARE FACTORS FREQUENCY  PT (By licensed PT), OT (By licensed OT)     PT Frequency: min 5x weekly OT Frequency: min 5x weekly            Contractures Contractures Info: Not present    Additional Factors Info  Code Status, Allergies Code Status Info: full Allergies Info: No Known Allergies           Current Medications (04/25/2018):  This is the current hospital active medication list Current Facility-Administered Medications  Medication Dose Route Frequency Provider Last Rate Last Dose  . 0.9 %  sodium chloride infusion   Intravenous Continuous Newt Minion, MD 10 mL/hr at 04/25/18 1050    . acetaminophen (TYLENOL) tablet 325-650 mg  325-650 mg Oral Q6H PRN Newt Minion, MD   650 mg at 04/24/18 1219  . amLODipine (NORVASC) tablet 5 mg  5 mg Oral BID Newt Minion, MD   5 mg at 04/25/18 1052  . Ampicillin-Sulbactam (UNASYN) 3 g in sodium chloride 0.9 % 100 mL IVPB  3 g Intravenous Q6H Newt Minion, MD 200 mL/hr at 04/25/18 1049 3 g at 04/25/18 1049  . bisacodyl (DULCOLAX) suppository 10 mg  10  mg Rectal Daily PRN Newt Minion, MD      . docusate sodium (COLACE) capsule 100 mg  100 mg Oral BID Newt Minion, MD   100 mg at 04/25/18 1053  . fentaNYL (SUBLIMAZE) 100 MCG/2ML injection           . hydrALAZINE (APRESOLINE) injection 10 mg  10 mg Intravenous Q6H PRN Newt Minion, MD      . hydrALAZINE (APRESOLINE) tablet 10 mg  10 mg Oral Q8H Newt Minion, MD   10 mg at 04/25/18 3976  . HYDROmorphone (DILAUDID) injection 0.5-1 mg  0.5-1 mg Intravenous Q4H PRN Newt Minion, MD   1 mg at 04/25/18 0155  . insulin aspart (novoLOG) injection 0-5 Units  0-5 Units Subcutaneous QHS Newt Minion, MD      . insulin aspart (novoLOG) injection 0-9 Units  0-9 Units Subcutaneous TID WC Newt Minion, MD   2 Units at 04/25/18 1150  . insulin aspart (novoLOG) injection 3 Units  3 Units Subcutaneous TID WC Newt Minion, MD   3 Units at 04/24/18 1219  . insulin glargine (LANTUS) injection 20 Units  20 Units Subcutaneous QHS Newt Minion, MD   20 Units at 04/24/18 2241  . lactated ringers infusion   Intravenous Continuous Newt Minion, MD 10 mL/hr at 04/25/18 [phone]    . magnesium citrate solution 1 Bottle  1 Bottle Oral Once PRN Newt Minion, MD      . methocarbamol (ROBAXIN) tablet 500 mg  500 mg Oral Q6H PRN Newt Minion, MD   500 mg at 04/24/18 0409   Or  . methocarbamol (ROBAXIN) 500 mg in dextrose 5 % 50 mL IVPB  500 mg Intravenous Q6H PRN Newt Minion, MD      . morphine 2 MG/ML injection 2 mg  2 mg Intravenous Q4H PRN Newt Minion, MD   2 mg at 04/24/18 2240  . ondansetron (ZOFRAN) tablet 4 mg  4 mg Oral Q6H PRN Newt Minion, MD       Or  . ondansetron [facility]) injection 4 mg  4 mg Intravenous Q6H PRN Newt Minion, MD      . oxyCODONE (Oxy IR/ROXICODONE) 5 MG immediate release tablet           . oxyCODONE (Oxy IR/ROXICODONE) immediate release tablet 10-15 mg  10-15 mg Oral Q4H PRN Newt Minion, MD   10 mg at 04/23/18 2102  . oxyCODONE (Oxy IR/ROXICODONE) immediate release tablet 5-10 mg  5-10 mg Oral Q4H PRN Newt Minion, MD   10 mg at 04/25/18 0949  . polyethylene glycol (MIRALAX / GLYCOLAX) packet 17 g  17 g Oral Daily PRN Newt Minion, MD      . senna-docusate (Senokot-S) tablet 1 tablet  1 tablet Oral QHS Newt Minion, MD   1 tablet at 04/23/18 2101  . sodium chloride 0.9 % bolus 500 mL  500 mL Intravenous Once Newt Minion, MD      . sodium chloride flush (NS) 0.9 % injection 10-40 mL  10-40 mL Intracatheter PRN Newt Minion, MD          Discharge Medications: Please see discharge summary for a list of discharge medications.  Relevant Imaging Results:  Relevant Lab Results:   Additional Information SSN: [id number]  Alberteen Sam, LCSW

## 2018-04-25 NOTE — Progress Notes (Signed)
Orthopedic Tech Progress Note Patient Details:  [name] 1940/11/06 [id number] Patient did not want to get a new shoe due to she just received a she 3 days ago and wants to keep the same shoe. I went and let there RN know what the patient requested.  Patient ID: [name], female   DOB: [birth year], 78 y.o.   MRN: [id number]   [name] 04/25/2018, 10:53 AM

## 2018-04-25 NOTE — TOC Initial Note (Addendum)
Transition of Care [facility]) - Initial/Assessment Note    Patient Details  Name: [name] MRN: [id number] Date of Birth: 07-19-1940  [facility] CM/SW Contact:    Alberteen Sam, LCSW Phone Number: 04/25/2018, 2:58 PM  Clinical Narrative:                  CSW consulted with patient regarding SNF placement at time of discharge. CSW explained PT recommendation of short term rehab at skilled nursing facility prior to going home, patient in agreement and expressed understanding. CSW reviewed bed offers with patient in which she reports not knowing where she wants to go and requested CSW to call her daughter in law Chivere as she is familiar with the facilities in the area and patient would like her opinion. CSW reached out to [place] with no answer and left voicemail. Awaiting return call to discuss bed offers.   Expected Discharge Plan: Skilled Nursing Facility Barriers to Discharge: No Barriers Identified   Patient Goals and CMS Choice Patient states their goals for this hospitalization and ongoing recovery are:: to go to rehab then go home CMS Medicare.gov Compare Post Acute Care list provided to:: Patient Choice offered to / list presented to : Patient  Expected Discharge Plan and Services Expected Discharge Plan: [place]   Discharge Planning Services: NA Post Acute Care Choice: [place] Living arrangements for the past 2 months: Single Family Home                 DME Arranged: N/A DME Agency: NA HH Arranged: NA HH Agency: NA  Prior Living Arrangements/Services Living arrangements for the past 2 months: [place] with:: Self Patient language and need for interpreter reviewed:: Yes Do you feel safe going back to the place where you live?: No   needs rehab prior to safe discharge home  Need for Family Participation in Patient Care: Yes (Comment) Care giver support system in place?: Yes (comment)   Criminal  Activity/Legal Involvement Pertinent to Current Situation/Hospitalization: No - Comment as needed  Activities of Daily Living Home Assistive Devices/Equipment: Walker (specify type) ADL Screening (condition at time of admission) Patient's cognitive ability adequate to safely complete daily activities?: No Is the patient deaf or have difficulty hearing?: No Does the patient have difficulty seeing, even when wearing glasses/contacts?: No Does the patient have difficulty concentrating, remembering, or making decisions?: Yes Patient able to express need for assistance with ADLs?: Yes Does the patient have difficulty dressing or bathing?: Yes Independently performs ADLs?: No Does the patient have difficulty walking or climbing stairs?: Yes Weakness of Legs: Both Weakness of Arms/Hands: None  Permission Sought/Granted Permission sought to share information with : Case Manager, Customer service manager Permission granted to share information with : Yes, Verbal Permission Granted  Share Information with NAME: Chivere  Permission granted to share info w AGENCY: SNFs  Permission granted to share info w Relationship: daughter in law  Permission granted to share info w Contact Information: [phone]  Emotional Assessment Appearance:: Appears stated age Attitude/Demeanor/Rapport: Gracious Affect (typically observed): Accepting Orientation: : Oriented to Self, Oriented to Place, Oriented to  Time, Oriented to Situation Alcohol / Substance Use: Not Applicable Psych Involvement: No (comment)  Admission diagnosis:  Hypokalemia [E87.6] Abscess of foot [L02.619] Foreign body (FB) in soft tissue [M79.5] Uncontrolled other specified diabetes mellitus with hyperglycemia ([place]) [E13.65] Patient Active Problem List   Diagnosis Date Noted  . 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  AKI (acute kidney injury) ([place])   . Bacteremia due to methicillin susceptible Staphylococcus aureus (MSSA)  04/21/2018  . Abscess of foot   . Diabetic foot infection ([place]) 04/20/2018  . Iron deficiency 04/18/2018  . Foreign body in right foot 04/18/2018  . Cellulitis of right leg 04/18/2018  . Uncontrolled diabetes mellitus ([place] ) 04/12/2016  . Vitamin D deficiency 03/06/2016  . 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 Essential hypertension 03/21/2015  . Vision decreased 03/21/2015  . Tension-type headache, not intractable 03/21/2015  . Anemia due to chronic blood loss 03/16/2015  . Morbid obesity ([place]) 02/02/2015  . Anemia 01/30/2015   PCP:  Flossie Buffy, NP Pharmacy:   [facility] [address] ([address] [place] - [address] [place] [address] Phone: [phone] Fax: [phone]     Social Determinants of Health (SDOH) Interventions    Readmission Risk Interventions No flowsheet data found.

## 2018-04-25 NOTE — Op Note (Signed)
04/25/2018  9:20 AM  PATIENT:  [name]    PRE-OPERATIVE DIAGNOSIS:  Abscess Right Foot  POST-OPERATIVE DIAGNOSIS:  Same  PROCEDURE:  REPEAT DEBRIDEMENT RIGHT FOOT, SPLIT THICKNESS SKIN GRAFT, APPLY VAC, Application Of Wound Vac Sharp excision of skin soft tissue muscle fascia and tendon with a rondure and knife Wound bed preparation 9 x 11 cm. Application of allograft split-thickness skin graft 125 cm.  SURGEON:  Newt Minion, MD  PHYSICIAN ASSISTANT:None ANESTHESIA:   General  PREOPERATIVE INDICATIONS:  [name] is a  78 y.o. female with a diagnosis of Abscess Right Foot who failed conservative measures and elected for surgical management.    The risks benefits and alternatives were discussed with the patient preoperatively including but not limited to the risks of infection, bleeding, nerve injury, cardiopulmonary complications, the need for revision surgery, among others, and the patient was willing to proceed.  OPERATIVE IMPLANTS: Allograft split-thickness skin graft 125 cm.  @ENCIMAGES @  OPERATIVE FINDINGS: Increased ischemic skin changes around the wound necessitating further skin and soft tissue excision.  OPERATIVE PROCEDURE: Patient was brought the operating room and underwent a general anesthetic.  After adequate levels anesthesia were obtained patient's right lower extremity was prepped using Betadine paint draped into a sterile field a timeout was called.  The sutures and sponge was removed.  Patient had increased necrotic skin along the wound edges.  This necessitated further sharp excision of the skin leaving a wound that was 9 x 11 cm.  A rondure was then used to further debride the soft tissue with excision of the muscle skin fascia and tendons.  This was debrided back to healthy viable tissue.  The wound was irrigated with normal saline.  The 125 cm split-thickness skin graft was applied.  This was then covered with a cleanse choice wound  VAC sponge.  This had a good suction fit patient was extubated taken the PACU in stable condition.   DISCHARGE PLANNING:  Antibiotic duration:  continue IV antibiotics  Weightbearing: Strict nonweightbearing on the right  Pain medication: Continued opioid pathway  Dressing care/ Wound VAC: Continue wound VAC for 1 week no irrigation  Ambulatory devices: Walker.  Discharge to: Anticipate discharge to home with the portable Praveena wound VAC pump.  Follow-up: In the office 1 week post operative.

## 2018-04-25 NOTE — Interval H&P Note (Signed)
History and Physical Interval Note:  04/25/2018 6:41 AM  [name]  has presented today for surgery, with the diagnosis of Abscess Right Foot.  The various methods of treatment have been discussed with the patient and family. After consideration of risks, benefits and other options for treatment, the patient has consented to  Procedure(s): REPEAT DEBRIDEMENT RIGHT FOOT, SPLIT THICKNESS SKIN GRAFT, APPLY VAC (Right) as a surgical intervention.  The patient's history has been reviewed, patient examined, no change in status, stable for surgery.  I have reviewed the patient's chart and labs.  Questions were answered to the patient's satisfaction.     Newt Minion

## 2018-04-25 NOTE — Anesthesia Postprocedure Evaluation (Signed)
Anesthesia Post Note  Patient: Librarian, academic  Procedure(s) Performed: REPEAT DEBRIDEMENT RIGHT FOOT, SPLIT THICKNESS SKIN GRAFT, APPLY VAC (Right Leg Lower) Application Of Wound Vac     Patient location during evaluation: PACU Anesthesia Type: General Level of consciousness: awake and alert Pain management: pain level controlled Vital Signs Assessment: post-procedure vital signs reviewed and stable Respiratory status: spontaneous breathing, nonlabored ventilation, respiratory function stable and patient connected to nasal cannula oxygen Cardiovascular status: blood pressure returned to baseline and stable Postop Assessment: no apparent nausea or vomiting Anesthetic complications: no    Last Vitals:  Vitals:   04/25/18 1045 04/25/18 1148  BP: (!) 177/95 (!) 154/85  Pulse: 90 95  Resp: 20 20  Temp: 36.7 C 36.7 C  SpO2: 96% 92%    Last Pain:  Vitals:   04/25/18 1148  TempSrc: Oral  PainSc:                  Tiajuana Amass

## 2018-04-25 NOTE — Anesthesia Preprocedure Evaluation (Addendum)
Anesthesia Evaluation  Patient identified by MRN, date of birth, ID band Patient awake    Reviewed: Allergy & Precautions, NPO status , Patient's Chart, lab work & pertinent test results, reviewed documented beta blocker date and time   Airway Mallampati: III  TM Distance: >3 FB Neck ROM: Full    Dental  (+) Edentulous Upper, Edentulous Lower, Dental Advisory Given   Pulmonary neg pulmonary ROS,     + decreased breath sounds      Cardiovascular hypertension, Pt. on medications  Rhythm:Regular     Neuro/Psych  Headaches, PSYCHIATRIC DISORDERS Anxiety Depression Dementia    GI/Hepatic Neg liver ROS, GERD  Controlled,  Endo/Other  diabetes, Type 2, Insulin Dependent, Oral Hypoglycemic Agents  Renal/GU negative Renal ROS     Musculoskeletal   Abdominal   Peds  Hematology  (+) anemia ,   Anesthesia Other Findings   Reproductive/Obstetrics                            Lab Results  Component Value Date   WBC 7.6 04/25/2018   HGB 13.1 04/25/2018   HCT 36.4 04/25/2018   MCV 78.1 (L) 04/25/2018   PLT 455 (H) 04/25/2018   Lab Results  Component Value Date   CREATININE 0.93 04/25/2018   BUN 5 (L) 04/25/2018   NA 143 04/25/2018   K 3.1 (L) 04/25/2018   CL 102 04/25/2018   CO2 28 04/25/2018    Anesthesia Physical  Anesthesia Plan  ASA: III  Anesthesia Plan: General   Post-op Pain Management:    Induction: Intravenous  PONV Risk Score and Plan: 4 or greater and Ondansetron, Dexamethasone and Treatment may vary due to age or medical condition  Airway Management Planned: Oral ETT  Additional Equipment: None  Intra-op Plan:   Post-operative Plan: Extubation in OR  Informed Consent: I have reviewed the patients History and Physical, chart, labs and discussed the procedure including the risks, benefits and alternatives for the proposed anesthesia with the patient or authorized  representative who has indicated his/her understanding and acceptance.     Dental advisory given  Plan Discussed with: CRNA  Anesthesia Plan Comments:         Anesthesia Quick Evaluation

## 2018-04-25 NOTE — Progress Notes (Signed)
OT Cancellation Note  Patient Details Name: [name] MRN: [id number] DOB: 1940/04/11   Cancelled Treatment:    Reason Eval/Treat Not Completed: Patient declined, no reason specified Patient declined participating in OT at this time and does not want to be repositioned to chair for easier access eating lunch. It would be interrupting her lunch.  Richelle Ito, OTR/L  Acute Rehabilitation Services Pager: [phone] Office: [phone] .  04/25/2018, 12:08 PM

## 2018-04-25 NOTE — Progress Notes (Signed)
OT Cancellation Note  Patient Details Name: Mackenzie Groom MRN: [id number] DOB: [birth year]   Cancelled Treatment:    Reason Eval/Treat Not Completed: Patient at procedure or test/ unavailable(I & D )  Jeri Modena 04/25/2018, 7:36 AM  Jeri Modena, OTR/L  Acute Rehabilitation Services Pager: [phone] Office: [phone] .

## 2018-04-25 NOTE — Transfer of Care (Signed)
Immediate Anesthesia Transfer of Care Note  Patient: [name]  Procedure(s) Performed: REPEAT DEBRIDEMENT RIGHT FOOT, SPLIT THICKNESS SKIN GRAFT, APPLY VAC (Right Leg Lower) Application Of Wound Vac  Patient Location: PACU  Anesthesia Type:General  Level of Consciousness: awake and alert   Airway & Oxygen Therapy: Patient Spontanous Breathing  Post-op Assessment: Report given to RN, Post -op Vital signs reviewed and stable and Patient moving all extremities X 4  Post vital signs: Reviewed and stable  Last Vitals:  Vitals Value Taken Time  BP    Temp    Pulse 109 04/25/2018  9:28 AM  Resp 24 04/25/2018  9:28 AM  SpO2 95 % 04/25/2018  9:28 AM  Vitals shown include unvalidated device data.  Last Pain:  Vitals:   04/25/18 6153  TempSrc: Oral  PainSc:       Patients Stated Pain Goal: 0 (79/43/27 6147)  Complications: No apparent anesthesia complications

## 2018-04-25 NOTE — Anesthesia Procedure Notes (Signed)
Procedure Name: Intubation Date/Time: 04/25/2018 8:49 AM Performed by: Harden Mo, CRNA Pre-anesthesia Checklist: Patient identified, Emergency Drugs available, Suction available and Patient being monitored Patient Re-evaluated:Patient Re-evaluated prior to induction Oxygen Delivery Method: Circle System Utilized Preoxygenation: Pre-oxygenation with 100% oxygen Induction Type: IV induction and Rapid sequence Laryngoscope Size: Miller and 2 Grade View: Grade I Tube type: Oral Tube size: 7.0 mm Number of attempts: 1 Airway Equipment and Method: Stylet and Oral airway Placement Confirmation: ETT inserted through vocal cords under direct vision,  positive ETCO2 and breath sounds checked- equal and bilateral Secured at: 21 cm Tube secured with: Tape Dental Injury: Teeth and Oropharynx as per pre-operative assessment

## 2018-04-25 NOTE — Progress Notes (Signed)
1045 Received pt from PACU, A&O x4. Right foot with coban dressing dry and intact. Wound vac dressing intact. Right leg elevated to pillows.

## 2018-04-25 NOTE — Progress Notes (Signed)
PT Cancellation Note  Patient Details Name: [name] MRN: [id number] DOB: [birth year]   Cancelled Treatment:    Reason Eval/Treat Not Completed: Patient declined, no reason specified Patient declined participating in PT at this time and reports being upset that people are "bombarding" her and interrupting her lunch. PT will continue to follow acutely.   Salina April, PTA Acute Rehabilitation Services Pager: [phone] Office: [phone]   04/25/2018, 11:44 AM

## 2018-04-25 NOTE — Progress Notes (Signed)
[facility]                                                                              Patient Demographics  [name], is a 78 y.o. female, DOB - [birth year], OFH:[id number]  Admit date - 04/20/2018   Admitting Physician Colbert Ewing, MD  Outpatient Primary MD for the patient is [name], [name], NP  Outpatient specialists:   LOS - 5  days   Medical records reviewed and are as summarized below:    Chief Complaint  Patient presents with  . Right Foot / Ankle Pain       Brief summary   Hannah Thorton Nevelsis a 78 y.o.femalewithhx of, GERD, ovarian cancer s/p tumor resection in 2017, and uncontrolled DMT2 on insulin (currently not taking insulin) who presented with worsening R foot pain and swelling x 1 week. Patient reports that she accidentally stepped on broken glass about 2 weeks ago. Since then, noticed pain with weight bearing on the R foot and swelling of the foot and ankle. She was seen by outpatient podiatry on 3/19 where an XR showed soft tissue swelling and possible 74m foreign object in the superficial R plantar soft tissue.  She saw podiatry as an outpatient on 3/19 and plans were for patient to get an IND, received the block however did not wait for the doctor to come back, claims like she was never told how long she would have to wait and got upset for not being communicated with appropriately and left.  She was advised to go to the ED at that time but patient's husband brought her home instead.  Came back to the ER on 3/22 with worsening right foot pain and swelling.  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  Patient met sepsis physiology at the  time of admission with tachycardia, leukocytosis, source due to right foot infection -Patient had bedside I&D performed in ED on 3/22, x-ray showed persistent foreign body -Orthopedics was consulted, patient underwent right foot debridement and foreign body removal on 3/24 -Repeat debridement done on the right foot with split thickness skin graft with wound VAC today by Dr. DSharol Given  Recommended DC with portable Praveena wound VAC pump -ID following, continue Unasyn -ID following, recommended continue ampicillin sulbactam, PICC placed, per ID, will need 6 weeks of IV antibiotic therapy.   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 CMegan Salonrecommended continue Unasyn for 6 weeks  Diabetes mellitus type 2, uncontrolled, IDDM with complications including hyperglycemia, diabetic foot infection -CBGs currently stable, hemoglobin A1c on 3/19 was 13.5 -Continue Lantus 20 units daily, NovoLog meal coverage, sliding scale insulin   Hypokalemia Replace  Essential hypertension Continue Norvasc, hydralazine - lisinopril held  due to AKI at the time of admission -Continue IV hydralazine with parameters  Mild acute kidney injury - Creatinine 1.4 on 3/24, slightly trending down  -Now improved  Home safety concerns PT OT evaluation recommended skilled nursing facility  Code Status: Full code DVT Prophylaxis:  SCD's Family Communication: Discussed in detail with the patient, all imaging results, lab results explained to the patient    Disposition Plan: Await OR on 3/27, will need skilled nursing facility postop  Time Spent in minutes 25 minutes  Procedures:  04/22/18:  RIGHT FOOT DEBRIDEMENT AND FOREIGN BODY REMOVAL, wound VAC 04/25/2018: Repeat debridement right foot with split thickness skin graft, VAC   Consultants:   Orthopedics Infectious disease  Antimicrobials:   Anti-infectives (From admission, onward)   Start     Dose/Rate Route Frequency Ordered Stop   04/25/18 0745  ceFAZolin (ANCEF) IVPB 2g/100 mL premix     2 g 200 mL/hr over 30 Minutes Intravenous On call to O.R. 04/25/18 0733 04/25/18 0900   04/23/18 1600  [MAR Hold]  Ampicillin-Sulbactam (UNASYN) 3 g in sodium chloride 0.9 % 100 mL IVPB     (MAR Hold since Fri 04/25/2018 at 0716. Reason: Transfer to a Procedural area.)   3 g 200 mL/hr over 30 Minutes Intravenous Every 6 hours 04/23/18 1007     04/21/18 1800  vancomycin (VANCOCIN) 1,250 mg in sodium chloride 0.9 % 250 mL IVPB  Status:  Discontinued     1,250 mg 166.7 mL/hr over 90 Minutes Intravenous Every 24 hours 04/21/18 0851 04/21/18 1703   04/21/18 1800  Ampicillin-Sulbactam (UNASYN) 3 g in sodium chloride 0.9 % 100 mL IVPB  Status:  Discontinued     3 g 200 mL/hr over 30 Minutes Intravenous Every 8 hours 04/21/18 1749 04/23/18 1007   04/21/18 1000  cefTRIAXone (ROCEPHIN) 2 g in sodium chloride 0.9 % 100 mL IVPB  Status:  Discontinued     2 g 200 mL/hr over 30 Minutes Intravenous Every 24 hours 04/20/18 2025 04/20/18 2026   04/21/18 1000  metroNIDAZOLE (FLAGYL) IVPB 500 mg  Status:  Discontinued     500 mg 100 mL/hr over 60 Minutes Intravenous Every 8 hours 04/21/18 0824 04/21/18 1729   04/21/18 0600  cefTRIAXone (ROCEPHIN) 2 g in sodium chloride 0.9 % 100 mL IVPB  Status:  Discontinued     2 g 200 mL/hr over 30 Minutes Intravenous Every 24 hours 04/20/18 2026 04/21/18 1729   04/20/18 2100  vancomycin (VANCOCIN) 1,500 mg in sodium chloride 0.9 % 500 mL IVPB     1,500 mg 250 mL/hr over 120 Minutes Intravenous  Once 04/20/18 2009 04/20/18 2231   04/20/18 2000  piperacillin-tazobactam (ZOSYN) IVPB 3.375 g     3.375 g 100 mL/hr over 30 Minutes Intravenous  Once 04/20/18 1947 04/20/18 2031         Medications  Scheduled Meds: . [MAR Hold] amLODipine  5 mg Oral  BID  . chlorhexidine  60 mL Topical Once  . [MAR Hold] docusate sodium  100 mg Oral BID  . fentaNYL      . [MAR Hold] hydrALAZINE  10 mg Oral Q8H  . [MAR Hold] insulin aspart  0-5 Units Subcutaneous QHS  . [MAR Hold] insulin aspart  0-9 Units Subcutaneous TID WC  . [MAR Hold] insulin aspart  3 Units Subcutaneous TID WC  . [MAR Hold] insulin glargine  20 Units Subcutaneous QHS  . oxyCODONE      . [MAR Hold] senna-docusate  1  tablet Oral QHS   Continuous Infusions: . [MAR Hold] ampicillin-sulbactam (UNASYN) IV 3 g (04/25/18 0341)  . lactated ringers 10 mL/hr at 04/25/18 0738  . [MAR Hold] methocarbamol (ROBAXIN) IV    . methocarbamol (ROBAXIN) IV    . [MAR Hold] sodium chloride     PRN Meds:.[MAR Hold] acetaminophen, [MAR Hold] bisacodyl, fentaNYL (SUBLIMAZE) injection, [MAR Hold] hydrALAZINE, [MAR Hold]  HYDROmorphone (DILAUDID) injection, [MAR Hold] magnesium citrate, [MAR Hold] methocarbamol **OR** [MAR Hold] methocarbamol (ROBAXIN) IV, methocarbamol **OR** methocarbamol (ROBAXIN) IV, [MAR Hold]  morphine injection, [MAR Hold] ondansetron **OR** [MAR Hold] ondansetron (ZOFRAN) IV, [MAR Hold] oxyCODONE, [MAR Hold] oxyCODONE, [MAR Hold] polyethylene glycol, [MAR Hold] sodium chloride flush      Subjective:   [name] was seen and examined today.  Seen in PACU, complaining of pain.  No fevers or chills.  Patient denies dizziness, chest pain, shortness of breath, abdominal pain, N/V/D/C, new weakness, numbess, tingling.  No acute issues overnight.  Objective:   Vitals:   04/25/18 0947 04/25/18 0950 04/25/18 0955 04/25/18 1000  BP: (!) 164/79  (!) 159/78 (!) 159/78  Pulse: 93 94 92 89  Resp: 17 18 18 12   Temp:      TempSrc:      SpO2: 93% 93% 96% 94%  Weight:      Height:        Intake/Output Summary (Last 24 hours) at 04/25/2018 1021 Last data filed at 04/25/2018 0909 Gross per 24 hour  Intake 1040 ml  Output 2547 ml  Net -1507 ml     Wt Readings from Last 3  Encounters:  04/25/18 92 kg  04/17/18 92.8 kg  04/19/16 101.6 kg   Physical Exam  General: Alert and oriented x 3, NAD  Eyes:   HEENT:    Cardiovascular: S1 S2 clear, RRR. No pedal edema b/l  Respiratory: CTAB, no wheezing, rales or rhonchi  Gastrointestinal: Soft, nontender, nondistended, NBS  Ext: no pedal edema bilaterally  Neuro: no new deficits  Musculoskeletal: No cyanosis, clubbing  Skin: Right lower extremity with dressing and wound VAC  Psych: Normal affect and demeanor, alert and oriented x3      Data Reviewed:  I have personally reviewed following labs and imaging studies  Micro Results Recent Results (from the past 240 hour(s))  Blood culture (routine x 2)     Status: None (Preliminary result)   Collection Time: 04/20/18  7:49 PM  Result Value Ref Range Status   Specimen Description   Final    BLOOD LEFT FOREARM Performed at [facility], [address], [place], [place] [address]    Special Requests   Final    BOTTLES DRAWN AEROBIC AND ANAEROBIC Blood Culture results may not be optimal due to an excessive volume of blood received in culture bottles Performed at [facility] [address], [place], [place] [address]    Culture   Final    NO GROWTH 4 DAYS Performed at [facility], [place] [address], [place], [place] [address]    Report Status PENDING  Incomplete  Wound or Superficial Culture     Status: None   Collection Time: 04/20/18  7:50 PM  Result Value Ref Range Status   Specimen Description   Final    ABSCESS Performed at [facility] [address], [place],  [address]    Special Requests   Final    Immunocompromised Performed at [facility], [address] [place], [place] [address]    Gram Stain   Final    FEW WBC PRESENT, PREDOMINANTLY PMN MODERATE GRAM POSITIVE RODS FEW GRAM POSITIVE COCCI IN CHAINS RARE GRAM NEGATIVE RODS    Culture   Final     ABUNDANT GROUP B STREP(S.AGALACTIAE)ISOLATED TESTING AGAINST S. AGALACTIAE NOT ROUTINELY PERFORMED DUE TO PREDICTABILITY OF AMP/PEN/VAN SUSCEPTIBILITY. FEW LACTOBACILLUS SPECIES Standardized susceptibility testing for this organism is not available. Performed at [facility], [place] [address], [place], [place] [address]    Report Status 04/24/2018 FINAL  Final  Blood culture (routine x 2)     Status: Abnormal   Collection Time: 04/20/18  7:54 PM  Result Value Ref Range Status   Specimen Description   Final    BLOOD RIGHT FOREARM Performed at [facility], [place] [address] [place], [place] [address]    Special Requests   Final    BOTTLES DRAWN AEROBIC AND ANAEROBIC Blood Culture adequate volume Performed at [facility] [address] [place], [place] [address]    Culture  Setup Time   Final    GRAM POSITIVE COCCI IN BOTH AEROBIC AND ANAEROBIC BOTTLES CRITICAL RESULT CALLED TO, READ BACK BY AND VERIFIED WITHDiona Browner PHARMD 1645 04/21/18 A BROWNING Performed at [facility], [place] [address], [place], [place] [address]    Culture STAPHYLOCOCCUS AUREUS (A)  Final   Report Status 04/23/2018 FINAL  Final   Organism ID, Bacteria STAPHYLOCOCCUS AUREUS  Final      Susceptibility   Staphylococcus aureus - MIC*    CIPROFLOXACIN <=0.5 SENSITIVE Sensitive     ERYTHROMYCIN <=0.25 SENSITIVE Sensitive     GENTAMICIN <=0.5 SENSITIVE Sensitive     OXACILLIN <=0.25 SENSITIVE Sensitive     TETRACYCLINE <=1 SENSITIVE Sensitive     VANCOMYCIN <=0.5 SENSITIVE Sensitive     TRIMETH/SULFA <=10 SENSITIVE Sensitive     CLINDAMYCIN <=0.25 SENSITIVE Sensitive     RIFAMPIN <=0.5 SENSITIVE Sensitive     Inducible Clindamycin NEGATIVE Sensitive     * STAPHYLOCOCCUS AUREUS  Blood Culture ID Panel (Reflexed)     Status: Abnormal   Collection Time: 04/20/18  7:54 PM  Result Value Ref Range Status   Enterococcus species NOT DETECTED NOT DETECTED Final   Listeria  monocytogenes NOT DETECTED NOT DETECTED Final   Staphylococcus species DETECTED (A) NOT DETECTED Final    Comment: CRITICAL RESULT CALLED TO, READ BACK BY AND VERIFIED WITH: T DANG PHARMD 1645 04/21/18 A BROWNING    Staphylococcus aureus (BCID) DETECTED (A) NOT DETECTED Final    Comment: Methicillin (oxacillin) susceptible Staphylococcus aureus (MSSA). Preferred therapy is anti staphylococcal beta lactam antibiotic (Cefazolin or Nafcillin), unless clinically contraindicated. CRITICAL RESULT CALLED TO, READ BACK BY AND VERIFIED WITH: T DANG PHARMD 1645 04/21/18 A BROWNING    Methicillin resistance NOT DETECTED NOT DETECTED Final   Streptococcus species NOT DETECTED NOT DETECTED Final   Streptococcus agalactiae NOT DETECTED NOT DETECTED Final   Streptococcus pneumoniae NOT DETECTED NOT DETECTED Final   Streptococcus pyogenes NOT DETECTED NOT DETECTED Final   Acinetobacter baumannii NOT DETECTED NOT DETECTED Final   Enterobacteriaceae species NOT DETECTED NOT DETECTED Final   Enterobacter cloacae complex NOT DETECTED NOT DETECTED Final   Escherichia coli NOT DETECTED NOT DETECTED Final   Klebsiella oxytoca NOT DETECTED NOT DETECTED Final   Klebsiella pneumoniae NOT DETECTED NOT DETECTED Final   Proteus species NOT DETECTED NOT DETECTED Final   Serratia marcescens NOT DETECTED  NOT DETECTED Final   Haemophilus influenzae NOT DETECTED NOT DETECTED Final   Neisseria meningitidis NOT DETECTED NOT DETECTED Final   Pseudomonas aeruginosa NOT DETECTED NOT DETECTED Final   Candida albicans NOT DETECTED NOT DETECTED Final   Candida glabrata NOT DETECTED NOT DETECTED Final   Candida krusei NOT DETECTED NOT DETECTED Final   Candida parapsilosis NOT DETECTED NOT DETECTED Final   Candida tropicalis NOT DETECTED NOT DETECTED Final    Comment: Performed at [facility], [place] [address] [place], [place] [address]  Culture, blood (routine x 2)     Status: None (Preliminary result)   Collection Time:  04/21/18  6:45 PM  Result Value Ref Range Status   Specimen Description BLOOD LEFT ANTECUBITAL  Final   Special Requests   Final    BOTTLES DRAWN AEROBIC ONLY Blood Culture adequate volume   Culture   Final    NO GROWTH 4 DAYS Performed at [facility], [place] [address], [place], [place] [address]    Report Status PENDING  Incomplete  Culture, blood (routine x 2)     Status: None (Preliminary result)   Collection Time: 04/21/18  6:55 PM  Result Value Ref Range Status   Specimen Description BLOOD LEFT ANTECUBITAL  Final   Special Requests   Final    BOTTLES DRAWN AEROBIC ONLY Blood Culture adequate volume   Culture   Final    NO GROWTH 4 DAYS Performed at [facility], [place] [address], [place], [place] [address]    Report Status PENDING  Incomplete  Aerobic/Anaerobic Culture (surgical/deep wound)     Status: None (Preliminary result)   Collection Time: 04/22/18  9:03 AM  Result Value Ref Range Status   Specimen Description TISSUE  Final   Special Requests RIGHT FOOT  Final   Gram Stain   Final    MODERATE WBC PRESENT, PREDOMINANTLY PMN MODERATE GRAM POSITIVE COCCI RARE GRAM POSITIVE RODS Performed at [facility], [place] [address] [place], [place] [address]    Culture   Final    MODERATE GROUP B STREP(S.AGALACTIAE)ISOLATED TESTING AGAINST S. AGALACTIAE NOT ROUTINELY PERFORMED DUE TO PREDICTABILITY OF AMP/PEN/VAN SUSCEPTIBILITY. NO ANAEROBES ISOLATED; CULTURE IN PROGRESS FOR 5 DAYS    Report Status PENDING  Incomplete  Surgical pcr screen     Status: Abnormal   Collection Time: 04/23/18  7:20 AM  Result Value Ref Range Status   MRSA, PCR NEGATIVE NEGATIVE Final   Staphylococcus aureus POSITIVE (A) NEGATIVE Final    Comment: (NOTE) The Xpert SA Assay (FDA approved for NASAL specimens in patients 32 years of age and older), is one component of a comprehensive surveillance program. It is not intended to diagnose infection nor to guide or monitor treatment.  Performed at [facility], [place] [address] [place], [place] [address]   MRSA PCR Screening     Status: None   Collection Time: 04/25/18  3:47 AM  Result Value Ref Range Status   MRSA by PCR NEGATIVE NEGATIVE Final    Comment:        The GeneXpert MRSA Assay (FDA approved for NASAL specimens only), is one component of a comprehensive MRSA colonization surveillance program. It is not intended to diagnose MRSA infection nor to guide or monitor treatment for MRSA infections. Performed at [facility], [place] [address], [place], [place] [address]     Radiology Reports Dg Foot Complete Right  Result Date: 04/20/2018 CLINICAL DATA:  Foot pain and swelling EXAM: RIGHT FOOT COMPLETE - 3+ VIEW COMPARISON:  04/17/2018 FINDINGS: Previously seen linear foreign body in the plantar soft tissues of the foot is again identified with appears slightly deeper. Adjacent soft tissue swelling is noted consistent with the given clinical history. No acute fracture is seen. Tarsal degenerative changes. IMPRESSION: No acute fractures noted. Previously seen foreign body is again identified slightly deeper in the plantar soft tissues. Electronically Signed   By: Inez Catalina M.D.   On: 04/20/2018 21:14   Dg Foot Complete Right  Result Date: 04/17/2018 CLINICAL DATA:  Stepped on glass with acute RIGHT foot pain. Initial encounter. EXAM: RIGHT FOOT COMPLETE - 3+ VIEW COMPARISON:  None. FINDINGS: Diffuse soft tissue swelling noted. A faint possible 3 mm linear foreign body within the superficial plantar soft tissues noted at the level of the metatarsal bases. No acute fracture or dislocation. Mild degenerative changes in the hindfoot and midfoot noted. No suspicious focal bony lesions identified. IMPRESSION: Equivocal 3 mm linear foreign body within the superficial plantar soft tissues at the level of the metatarsal bases. Soft tissue swelling without acute bony abnormality. Electronically Signed   By:  Margarette Canada M.D.   On: 04/17/2018 11:49   [name] Ekg Site Rite  Result Date: 04/23/2018 If Site Rite image not attached, placement could not be confirmed due to current cardiac rhythm.   Lab Data:  CBC: Recent Labs  Lab 04/21/18 0410 04/22/18 0353 04/23/18 0329 04/24/18 0329 04/25/18 0448  WBC 15.1* 12.5* 12.0* 8.1 7.6  NEUTROABS 11.7* 9.4* 9.7* 5.2 4.8  HGB 13.3 12.9 12.0 12.4 13.1  HCT 39.1 35.9* 34.9* 35.7* 36.4  MCV 80.8 77.5* 78.4* 77.6* 78.1*  PLT 369 364 394 427* 161*   Basic Metabolic Panel: Recent Labs  Lab 04/21/18 0410 04/22/18 0353 04/23/18 0329 04/24/18 0329 04/25/18 0448  NA 136 137 136 140 143  K 3.4* 3.5 3.5 3.5 3.1*  CL 104 104 103 107 102  CO2 21* 23 25 26 28   GLUCOSE 291* 258* 223* 117* 113*  BUN 14 13 14 11  5*  CREATININE 1.07* 1.45* 1.40* 1.17* 0.93  CALCIUM 8.1* 8.4* 8.5* 9.0 9.4  MG 1.7 1.7 1.7 1.8 1.6*   GFR: Estimated Creatinine Clearance: 57.1 mL/min (by C-G formula based on SCr of 0.93 mg/dL). Liver Function Tests: Recent Labs  Lab 04/20/18 1717  AST 18  ALT 18  ALKPHOS 115  BILITOT 0.9  PROT 8.4*  ALBUMIN 3.5   No results for input(s): LIPASE, AMYLASE in the last 168 hours. No results for input(s): AMMONIA in the last 168 hours. Coagulation Profile: No results for input(s): INR, PROTIME in the last 168 hours. Cardiac Enzymes: No results for input(s): CKTOTAL, CKMB, CKMBINDEX, TROPONINI in the last 168 hours. BNP (last 3 results) No results for input(s): PROBNP in the last 8760 hours. HbA1C: No results for input(s): HGBA1C in the last 72 hours. CBG: Recent Labs  Lab 04/24/18 1129 04/24/18 1646 04/24/18 2115 04/25/18 0743 04/25/18 0959  GLUCAP 143* 104* 92 102* 137*   Lipid Profile: No results for input(s): CHOL, HDL, LDLCALC, TRIG, CHOLHDL, LDLDIRECT in the last 72 hours. Thyroid Function Tests: No results for input(s): TSH, T4TOTAL, FREET4, T3FREE, THYROIDAB in the last 72 hours. Anemia Panel: No results for  input(s): VITAMINB12, FOLATE, FERRITIN, TIBC, IRON, RETICCTPCT in the last 72 hours. Urine analysis:    Component Value Date/Time   COLORURINE YELLOW 04/23/2018 0715   APPEARANCEUR HAZY (A) 04/23/2018 0715  APPEARANCEUR Hazy 07/26/2013 2055   LABSPEC 1.023 04/23/2018 0715   LABSPEC 1.014 07/26/2013 2055   PHURINE 5.0 04/23/2018 0715   GLUCOSEU 50 (A) 04/23/2018 0715   GLUCOSEU Negative 07/26/2013 2055   HGBUR SMALL (A) 04/23/2018 0715   BILIRUBINUR NEGATIVE 04/23/2018 0715   BILIRUBINUR Negative 07/26/2013 2055   KETONESUR NEGATIVE 04/23/2018 0715   PROTEINUR NEGATIVE 04/23/2018 0715   NITRITE NEGATIVE 04/23/2018 0715   LEUKOCYTESUR LARGE (A) 04/23/2018 0715   LEUKOCYTESUR 1+ 07/26/2013 2055     [name] M.D. [facility] 04/25/2018, 10:21 AM  Pager: [phone] Between 7am to 7pm - call Pager - [phone]  After 7pm go to [email or web address] - password [email or web address]  Call night coverage person covering after 7pm

## 2018-04-26 DIAGNOSIS — I358 Other nonrheumatic aortic valve disorders: Secondary | ICD-10-CM | POA: Diagnosis not present

## 2018-04-26 DIAGNOSIS — A4901 Methicillin susceptible Staphylococcus aureus infection, unspecified site: Secondary | ICD-10-CM | POA: Diagnosis not present

## 2018-04-26 DIAGNOSIS — R2681 Unsteadiness on feet: Secondary | ICD-10-CM | POA: Diagnosis not present

## 2018-04-26 DIAGNOSIS — M6281 Muscle weakness (generalized): Secondary | ICD-10-CM | POA: Diagnosis not present

## 2018-04-26 DIAGNOSIS — T148XXA Other injury of unspecified body region, initial encounter: Secondary | ICD-10-CM | POA: Diagnosis not present

## 2018-04-26 DIAGNOSIS — B9561 Methicillin susceptible Staphylococcus aureus infection as the cause of diseases classified elsewhere: Secondary | ICD-10-CM | POA: Diagnosis not present

## 2018-04-26 DIAGNOSIS — R52 Pain, unspecified: Secondary | ICD-10-CM | POA: Diagnosis not present

## 2018-04-26 DIAGNOSIS — E11628 Type 2 diabetes mellitus with other skin complications: Secondary | ICD-10-CM | POA: Diagnosis not present

## 2018-04-26 DIAGNOSIS — N179 Acute kidney failure, unspecified: Secondary | ICD-10-CM | POA: Diagnosis not present

## 2018-04-26 DIAGNOSIS — E785 Hyperlipidemia, unspecified: Secondary | ICD-10-CM | POA: Diagnosis not present

## 2018-04-26 DIAGNOSIS — R7881 Bacteremia: Secondary | ICD-10-CM | POA: Diagnosis not present

## 2018-04-26 DIAGNOSIS — E1165 Type 2 diabetes mellitus with hyperglycemia: Secondary | ICD-10-CM | POA: Diagnosis not present

## 2018-04-26 DIAGNOSIS — R5381 Other malaise: Secondary | ICD-10-CM | POA: Diagnosis not present

## 2018-04-26 DIAGNOSIS — L089 Local infection of the skin and subcutaneous tissue, unspecified: Secondary | ICD-10-CM | POA: Diagnosis not present

## 2018-04-26 DIAGNOSIS — E876 Hypokalemia: Secondary | ICD-10-CM | POA: Diagnosis not present

## 2018-04-26 DIAGNOSIS — A4101 Sepsis due to Methicillin susceptible Staphylococcus aureus: Secondary | ICD-10-CM | POA: Diagnosis not present

## 2018-04-26 DIAGNOSIS — Z743 Need for continuous supervision: Secondary | ICD-10-CM | POA: Diagnosis not present

## 2018-04-26 DIAGNOSIS — I10 Essential (primary) hypertension: Secondary | ICD-10-CM | POA: Diagnosis not present

## 2018-04-26 DIAGNOSIS — E119 Type 2 diabetes mellitus without complications: Secondary | ICD-10-CM | POA: Diagnosis not present

## 2018-04-26 DIAGNOSIS — R2689 Other abnormalities of gait and mobility: Secondary | ICD-10-CM | POA: Diagnosis not present

## 2018-04-26 DIAGNOSIS — L02619 Cutaneous abscess of unspecified foot: Secondary | ICD-10-CM | POA: Diagnosis not present

## 2018-04-26 DIAGNOSIS — R279 Unspecified lack of coordination: Secondary | ICD-10-CM | POA: Diagnosis not present

## 2018-04-26 DIAGNOSIS — W19XXXA Unspecified fall, initial encounter: Secondary | ICD-10-CM | POA: Diagnosis not present

## 2018-04-26 LAB — CBC
HEMATOCRIT: 35.2 % — AB (ref 36.0–46.0)
Hemoglobin: 12.6 g/dL (ref 12.0–15.0)
MCH: 27.9 pg (ref 26.0–34.0)
MCHC: 35.8 g/dL (ref 30.0–36.0)
MCV: 77.9 fL — ABNORMAL LOW (ref 80.0–100.0)
Platelets: 487 10*3/uL — ABNORMAL HIGH (ref 150–400)
RBC: 4.52 MIL/uL (ref 3.87–5.11)
RDW: 13.5 % (ref 11.5–15.5)
WBC: 10.8 10*3/uL — ABNORMAL HIGH (ref 4.0–10.5)
nRBC: 0 % (ref 0.0–0.2)

## 2018-04-26 LAB — CULTURE, BLOOD (ROUTINE X 2)
Culture: NO GROWTH
Culture: NO GROWTH
Culture: NO GROWTH
Special Requests: ADEQUATE
Special Requests: ADEQUATE

## 2018-04-26 LAB — BASIC METABOLIC PANEL
Anion gap: 11 (ref 5–15)
BUN: 11 mg/dL (ref 8–23)
CALCIUM: 9 mg/dL (ref 8.9–10.3)
CO2: 26 mmol/L (ref 22–32)
Chloride: 101 mmol/L (ref 98–111)
Creatinine, Ser: 1.19 mg/dL — ABNORMAL HIGH (ref 0.44–1.00)
GFR calc non Af Amer: 44 mL/min — ABNORMAL LOW (ref >60)
GFR, EST AFRICAN AMERICAN: 51 mL/min — AB (ref >60)
Glucose, Bld: 195 mg/dL — ABNORMAL HIGH (ref 70–99)
Potassium: 3.5 mmol/L (ref 3.5–5.1)
Sodium: 138 mmol/L (ref 135–145)

## 2018-04-26 LAB — GLUCOSE, CAPILLARY
Glucose-Capillary: 309 mg/dL — ABNORMAL HIGH (ref 70–99)
Glucose-Capillary: 165 mg/dL — ABNORMAL HIGH (ref 70–99)

## 2018-04-26 MED ORDER — DOCUSATE SODIUM 100 MG PO CAPS: 100.0000 mg | ORAL_CAPSULE | Freq: Two times a day (BID) | ORAL | 0 refills | Status: DC

## 2018-04-26 MED ORDER — HYDRALAZINE HCL 10 MG PO TABS: 10.0000 mg | ORAL_TABLET | Freq: Three times a day (TID) | ORAL | Status: DC

## 2018-04-26 MED ORDER — METHOCARBAMOL 500 MG PO TABS: 500.0000 mg | ORAL_TABLET | Freq: Four times a day (QID) | ORAL | Status: DC | PRN

## 2018-04-26 MED ORDER — INSULIN ASPART 100 UNIT/ML ~~LOC~~ SOLN: 0.0000 [IU] | Freq: Three times a day (TID) | SUBCUTANEOUS | 11 refills | Status: DC

## 2018-04-26 MED ORDER — BISACODYL 10 MG RE SUPP: 10.0000 mg | Freq: Every day | RECTAL | 0 refills | Status: DC | PRN

## 2018-04-26 MED ORDER — OXYCODONE HCL 5 MG PO TABS: 5.0000 mg | ORAL_TABLET | Freq: Four times a day (QID) | ORAL | 0 refills | Status: AC | PRN

## 2018-04-26 MED ORDER — CEFAZOLIN IV (FOR PTA / DISCHARGE USE ONLY): 2.0000 g | Freq: Three times a day (TID) | INTRAVENOUS | 0 refills | Status: AC

## 2018-04-26 MED ORDER — AMPICILLIN-SULBACTAM IV (FOR PTA / DISCHARGE USE ONLY): 3.0000 g | Freq: Four times a day (QID) | INTRAVENOUS | 0 refills | Status: AC

## 2018-04-26 MED ORDER — INSULIN GLARGINE 100 UNITS/ML SOLOSTAR PEN: 20.0000 [IU] | PEN_INJECTOR | Freq: Every day | SUBCUTANEOUS | 0 refills | Status: DC

## 2018-04-26 MED ORDER — INSULIN ASPART 100 UNIT/ML ~~LOC~~ SOLN: 4.0000 [IU] | Freq: Three times a day (TID) | SUBCUTANEOUS | 11 refills | Status: DC

## 2018-04-26 MED ORDER — AMLODIPINE BESYLATE 5 MG PO TABS: 5.0000 mg | ORAL_TABLET | Freq: Two times a day (BID) | ORAL | Status: DC

## 2018-04-26 NOTE — Discharge Summary (Signed)
Physician Discharge Summary   Patient ID: [name] MRN: [id number] DOB/AGE: [birth year] 78 y.o.  Admit date: 04/20/2018 Discharge date: 04/26/2018  Primary Care Physician:  Hannah Buffy, NP   Recommendations for Outpatient Follow-up:  1. Continue ampicillin sulbactam IV, stop date on 05/05/2018 2. Then start IV cefazolin for 4 weeks, START on 05/06/2018, stop date on 06/02/2018 3. Per orthopedics NWB RLE Continue with wound vac x 1 week Continue with iv abx as recommended above Follow up with Dr. Sharol Given one week post-op   Home Health: Patient going to skilled nursing facility Equipment/Devices: Wound VAC for 1 week  Discharge Condition: stable  CODE STATUS: FULL   Diet recommendation: Carb modified diet   Discharge Diagnoses:    . Sepsis due to right diabetic foot infection ([place]) . Foreign body in right foot . Abscess of foot . Bacteremia due to methicillin susceptible Staphylococcus aureus (MSSA) . Aortic valve endocarditis Hypokalemia Essential hypertension Mild acute kidney injury Uncontrolled diabetes mellitus type 2, IDDM   Consults: Orthopedics Infectious disease    Allergies:  No Known Allergies   DISCHARGE MEDICATIONS: Allergies as of 04/26/2018   No Known Allergies     Medication List    STOP taking these medications   clindamycin 300 MG capsule Commonly known as:  CLEOCIN   ibuprofen 600 MG tablet Commonly known as:  ADVIL,MOTRIN   insulin lispro protamine-lispro (75-25) 100 UNIT/ML Susp injection Commonly known as:  HUMALOG 75/25 MIX   SitaGLIPtin-MetFORMIN HCl 50-1000 MG Tb24     TAKE these medications   amLODipine 5 MG tablet Commonly known as:  NORVASC Take 1 tablet (5 mg total) by mouth 2 (two) times daily.   ampicillin-sulbactam  IVPB Commonly known as:  UNASYN Inject 3 g into the vein every 6 (six) hours for 11 days. Indication: Soft Tissue infection/Endocarditis Last Day of Therapy: 05/05/2018 Labs - Once weekly:   CBC/D and BMP, Labs - Every other week:  ESR and CRP   bisacodyl 10 MG suppository Commonly known as:  DULCOLAX Place 1 suppository (10 mg total) rectally daily as needed for moderate constipation.   calcium carbonate 500 MG chewable tablet Commonly known as:  TUMS - dosed in mg elemental calcium Chew 1 tablet by mouth every 6 (six) hours as needed for indigestion or heartburn.   ceFAZolin  IVPB Commonly known as:  ANCEF Inject 2 g into the vein every 8 (eight) hours for 27 days. Indication:  Endocarditis/MSSA Bacteremia Last Day of Therapy:  06/02/2018 Labs - Once weekly:  CBC/D and BMP, Labs - Every other week:  ESR and CRP Start taking on:  May 06, 2018   cholecalciferol 10 MCG (400 UNIT) Tabs tablet Commonly known as:  VITAMIN D3 Take 1,000 Units by mouth daily.   citalopram 10 MG tablet Commonly known as:  CELEXA Take 10 mg by mouth daily.   docusate sodium 100 MG capsule Commonly known as:  COLACE Take 1 capsule (100 mg total) by mouth 2 (two) times daily.   fenofibrate 145 MG tablet Commonly known as:  TRICOR Take 1 tablet (145 mg total) by mouth daily.   FreeStyle Libre 14 Day Sensor Misc 1 Units by Does not apply route every 14 (fourteen) days.   FreeStyle Libre Reader Devi 1 Units by Does not apply route every 14 (fourteen) days.   hydrALAZINE 10 MG tablet Commonly known as:  APRESOLINE Take 1 tablet (10 mg total) by mouth every 8 (eight) hours.   insulin aspart 100  UNIT/ML injection Commonly known as:  novoLOG Inject 4 Units into the skin 3 (three) times daily with meals.   insulin aspart 100 UNIT/ML injection Commonly known as:  novoLOG Inject 0-9 Units into the skin 3 (three) times daily with meals. Sliding scale CBG 70 - 120: 0 units CBG 121 - 150: 1 unit,  CBG 151 - 200: 2 units,  CBG 201 - 250: 3 units,  CBG 251 - 300: 5 units,  CBG 301 - 350: 7 units,  CBG 351 - 400: 9 units   CBG > 400: 9 units and notify your MD   insulin glargine 100 unit/mL  Sopn Commonly known as:  LANTUS Inject 0.2 mLs (20 Units total) into the skin at bedtime. What changed:  how much to take   INSULIN SYRINGE .5CC/30GX1/2" 30G X 1/2" 0.5 ML Misc 1 Units by Does not apply route 2 (two) times daily after a meal.   lisinopril 5 MG tablet Commonly known as:  PRINIVIL,ZESTRIL Take 1 tablet (5 mg total) by mouth daily.   methocarbamol 500 MG tablet Commonly known as:  ROBAXIN Take 1 tablet (500 mg total) by mouth every 6 (six) hours as needed for muscle spasms.   ondansetron 4 MG tablet Commonly known as:  ZOFRAN Take 4 mg by mouth every 8 (eight) hours as needed for nausea or vomiting.   oxyCODONE 5 MG immediate release tablet Commonly known as:  Oxy IR/ROXICODONE Take 1 tablet (5 mg total) by mouth every 6 (six) hours as needed for up to 8 days for moderate pain or severe pain (pain score 4-6).   Pen Needles 31G X 8 MM Misc 1 application by Does not apply route at bedtime.            Home Infusion Instuctions  (From admission, onward)         Start     Ordered   04/26/18 0000  Home infusion instructions [facility] May follow [place] Dosing Protocol; May administer Cathflo as needed to maintain patency of vascular access device.; Flushing of vascular access device: per [facility] Protocol: 0.9% NaCl pre/post medica...    Question Answer Comment  Instructions May follow [place] Dosing Protocol   Instructions May administer Cathflo as needed to maintain patency of vascular access device.   Instructions Flushing of vascular access device: per [facility] Protocol: 0.9% NaCl pre/post medication administration and prn patency; Heparin 100 u/ml, 38m for implanted ports and Heparin 10u/ml, 585mfor all other central venous catheters.   Instructions May follow [facility] Anaphylaxis Protocol for First Dose Administration in the home: 0.9% NaCl at 25-50 ml/hr to maintain IV access for protocol meds. Epinephrine 0.3 ml IV/IM PRN and Benadryl 25-50 IV/IM PRN s/s  of anaphylaxis.   Instructions [facility] Infusion Coordinator (RN) to assist per patient IV care needs in the home PRN.      04/26/18 0901           Brief H and P: For complete details please refer to admission H and P, but in brief Cherese [employer] [id number].femalewithhx of, GERD, ovarian cancer s/p tumor resection in 2017, and uncontrolled DMT2 on insulin (currently not taking insulin) who presented with worsening R foot pain and swelling x 1 week. Patient reports that she accidentally stepped on broken glass about 2 weeks ago. Since then, noticed pain with weight bearing on the R foot and swelling of the foot and ankle. She was seen by outpatient podiatry on  3/19 where an XR showed soft tissue swelling and possible 18m foreign object in the superficial R plantar soft tissue. She saw podiatry as an outpatient on 3/19 and plans were for patient to get an IND, received the block however did not wait for the doctor to come back, claims like she was never told how long she would have to wait and got upset for not being communicated with appropriately and left. She was advised to go to the ED at that time but patient's husband brought her home instead. Came back to the ER on 3/22 with worsening right foot pain and swelling. In the ED had an I&D, however follow-up x-ray showed persistent foreign body in her right foot, slightly deeper   Hospital Course:  Right foot diabetic infection complicated by transient MSSA bacteremia, probable aortic valve endocarditis -Right plantar diabetic foot infection after injury from glass 2 weeks ago.  Patient met sepsis physiology at the time of admission with tachycardia, leukocytosis, source due to right foot infection -Patient had bedside I&D performed in ED on 3/22, x-ray showed persistent foreign body -Orthopedics was consulted, patient underwent right foot debridement and foreign body removal on 3/24 -Repeat debridement done on the right  foot with split thickness skin graft with wound VAC on 04/25/2018 by Dr. DSharol Given  Recommended DC with wound VAC for 1 week -ID following, recommended continue ampicillin sulbactam till 05/05/2018, then followed by IV cefazolin for 4 weeks   Transient MSSA bacteremia secondary to #1, probable aortic valve endocarditis -2D echo showed more non-mobile vegetation on the coronary cusp of the aortic valve -Antibiotics as above  Diabetes mellitus type 2, uncontrolled, IDDM with complications including hyperglycemia, diabetic foot infection -CBGs currently stable, hemoglobin A1c on 3/19 was 13.5 -Continue Lantus 20 units daily, NovoLog meal coverage, sliding scale insulin   Hypokalemia Replaced  Essential hypertension Continue Norvasc, hydralazine - lisinopril was initially held due to AKI at the time of admission, resume at discharge   Mild acute kidney injury - Creatinine 1.4 on 3/24, improved, 1.1 at the time of discharge  Home safety concerns PT OT evaluation recommended skilled nursing facility   Day of Discharge S: No complaints, plan for discharge to skilled nursing facility today, no fevers or chills.  No acute issues overnight.  BP (!) 150/75 (BP Location: Left Arm)   Pulse 83   Temp 98.3 F (36.8 C) (Oral)   Resp 16   Ht 5' 4.5" (1.638 m)   Wt 92 kg   SpO2 97%   BMI 34.28 kg/m   Physical Exam: General: Alert and awake oriented x3 not in any acute distress. HEENT: anicteric sclera, pupils reactive to light and accommodation CVS: S1-S2 clear no murmur rubs or gallops Chest: clear to auscultation bilaterally, no wheezing rales or rhonchi Abdomen: soft nontender, nondistended, normal bowel sounds Extremities: no cyanosis, clubbing or edema noted bilaterally, right lower extremity dressing intact with wound VAC Neuro: No new deficits   The results of significant diagnostics from this hospitalization (including imaging, microbiology, ancillary and laboratory) are  listed below for reference.      Procedures/Studies:  Dg Foot Complete Right  Result Date: 04/20/2018 CLINICAL DATA:  Foot pain and swelling EXAM: RIGHT FOOT COMPLETE - 3+ VIEW COMPARISON:  04/17/2018 FINDINGS: Previously seen linear foreign body in the plantar soft tissues of the foot is again identified with appears slightly deeper. Adjacent soft tissue swelling is noted consistent with the given clinical history. No acute fracture is seen. Tarsal degenerative changes.  IMPRESSION: No acute fractures noted. Previously seen foreign body is again identified slightly deeper in the plantar soft tissues. Electronically Signed   By: Inez Catalina M.D.   On: 04/20/2018 21:14   Dg Foot Complete Right  Result Date: 04/17/2018 CLINICAL DATA:  Stepped on glass with acute RIGHT foot pain. Initial encounter. EXAM: RIGHT FOOT COMPLETE - 3+ VIEW COMPARISON:  None. FINDINGS: Diffuse soft tissue swelling noted. A faint possible 3 mm linear foreign body within the superficial plantar soft tissues noted at the level of the metatarsal bases. No acute fracture or dislocation. Mild degenerative changes in the hindfoot and midfoot noted. No suspicious focal bony lesions identified. IMPRESSION: Equivocal 3 mm linear foreign body within the superficial plantar soft tissues at the level of the metatarsal bases. Soft tissue swelling without acute bony abnormality. Electronically Signed   By: Margarette Canada M.D.   On: 04/17/2018 11:49   [name] Ekg Site Rite  Result Date: 04/23/2018 If Site Rite image not attached, placement could not be confirmed due to current cardiac rhythm.      LAB RESULTS: Basic Metabolic Panel: Recent Labs  Lab 04/25/18 0448 04/26/18 0344  NA 143 138  K 3.1* 3.5  CL 102 101  CO2 28 26  GLUCOSE 113* 195*  BUN 5* 11  CREATININE 0.93 1.19*  CALCIUM 9.4 9.0  MG 1.6*  --    Liver Function Tests: Recent Labs  Lab 04/20/18 1717  AST 18  ALT 18  ALKPHOS 115  BILITOT 0.9  PROT 8.4*  ALBUMIN  3.5   No results for input(s): LIPASE, AMYLASE in the last 168 hours. No results for input(s): AMMONIA in the last 168 hours. CBC: Recent Labs  Lab 04/25/18 0448 04/26/18 0344  WBC 7.6 10.8*  NEUTROABS 4.8  --   HGB 13.1 12.6  HCT 36.4 35.2*  MCV 78.1* 77.9*  PLT 455* 487*   Cardiac Enzymes: No results for input(s): CKTOTAL, CKMB, CKMBINDEX, TROPONINI in the last 168 hours. BNP: Invalid input(s): POCBNP CBG: Recent Labs  Lab 04/25/18 2122 04/26/18 0732  GLUCAP 309* 165*      Disposition and Follow-up: Discharge Instructions    Diet Carb Modified   Complete by:  As directed    Home infusion instructions [facility] May follow [place] Dosing Protocol; May administer Cathflo as needed to maintain patency of vascular access device.; Flushing of vascular access device: per [facility] Protocol: 0.9% NaCl pre/post medica...   Complete by:  As directed    Instructions:  May follow [place] Dosing Protocol   Instructions:  May administer Cathflo as needed to maintain patency of vascular access device.   Instructions:  Flushing of vascular access device: per [facility] Protocol: 0.9% NaCl pre/post medication administration and prn patency; Heparin 100 u/ml, 19m for implanted ports and Heparin 10u/ml, 59mfor all other central venous catheters.   Instructions:  May follow [facility] Anaphylaxis Protocol for First Dose Administration in the home: 0.9% NaCl at 25-50 ml/hr to maintain IV access for protocol meds. Epinephrine 0.3 ml IV/IM PRN and Benadryl 25-50 IV/IM PRN s/s of anaphylaxis.   Instructions:  [facility] Coordinator (RN) to assist per patient IV care needs in the home PRN.   Increase activity slowly   Complete by:  As directed         DISPOSITION: [place] for follow-up providers    DuNewt MinionMD In 1 week.  Specialty:  Orthopedic Surgery Contact information: [place] [address] [phone]            Contact information for after-discharge care    Destination    [facility] .   Service:  Skilled Nursing Contact information: [address] [place] [phone]                   Time coordinating discharge:  45 minutes  Signed:   Estill Cotta M.D. [facility] 04/26/2018, 9:36 AM

## 2018-04-26 NOTE — Progress Notes (Signed)
Physical Therapy Treatment Patient Details Name: [name] MRN: [id number] DOB: [birth year] Today's Date: 04/26/2018    History of Present Illness 78 y.o. 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 Patient reports that she accidentally stepped on broken glass about 2 weeks ago. Underwent I&D with wound vac placement on 3/24 by Dr [name]    PT Comments    Pt progressing towards goals. Able to perform short distance gait to chair and required min to mod A +2 with use of RW. Continues to be anxious with mobility and requires extended time to perform mobility tasks. Current recommendations appropriate. Will continue to follow acutely to maximize functional mobility independence and safety.    Follow Up Recommendations  SNF;Supervision/Assistance - 24 hour     Equipment Recommendations  None recommended by PT    Recommendations for Other Services       Precautions / Restrictions Precautions Precautions: Fall Restrictions Weight Bearing Restrictions: Yes RLE Weight Bearing: Non weight bearing    Mobility  Bed Mobility Overal bed mobility: Needs Assistance Bed Mobility: Supine to Sit     Supine to sit: Min assist     General bed mobility comments: Min A for scooting hips to EOB. Required increased time sitting at EOB as pt with increased anxiety.   Transfers Overall transfer level: Needs assistance Equipment used: Rolling walker (2 wheeled) Transfers: Sit to/from Stand Sit to Stand: Min assist;From elevated surface         General transfer comment: Min A for lift assist and steadying. Cues for safe hand placement, however, pt continuing to pull up on RW.   Ambulation/Gait Ambulation/Gait assistance: Min assist;Mod assist;+2 physical assistance Gait Distance (Feet): 1 Feet Assistive device: Rolling  walker (2 wheeled) Gait Pattern/deviations: Step-to pattern Gait velocity: Decreased.    General Gait Details: Pt able to perform hop to pattern to take steps to chair this session. Required min to mod A +2 for steadying assist and cues for sequencing using RW. Pt very easily distracted and with increased anxiety, therefore further mobility deferred.    Stairs             Wheelchair Mobility    Modified Rankin (Stroke Patients Only)       Balance Overall balance assessment: Needs assistance Sitting-balance support: No upper extremity supported Sitting balance-Leahy Scale: Good     Standing balance support: Bilateral upper extremity supported Standing balance-Leahy Scale: Poor Standing balance comment: reliant on UE support                            Cognition Arousal/Alertness: Awake/alert Behavior During Therapy: Anxious Overall Cognitive Status: No family/caregiver present to determine baseline cognitive functioning Area of Impairment: Attention;Following commands;Safety/judgement;Problem solving                   Current Attention Level: Sustained   Following Commands: Follows one step commands inconsistently;Follows one step commands with increased time Safety/Judgement: Decreased awareness of safety;Decreased awareness of deficits   Problem Solving: Difficulty sequencing;Requires verbal cues General Comments: speaks in a very high pitch childlike voice most of the time; pt is very distracted by pain and does not follow commands when pain increases.Required prolonged rest to calm anxiety      Exercises      General Comments General  comments (skin integrity, edema, etc.): Extensive education about importance of sitting up in chair.       Pertinent Vitals/Pain Pain Assessment: Faces Faces Pain Scale: Hurts even more Pain Location: R foot Pain Descriptors / Indicators: Grimacing;Guarding;Moaning Pain Intervention(s): Limited activity  within patient's tolerance;Monitored during session;Repositioned    Home Living                      Prior Function            PT Goals (current goals can now be found in the care plan section) Acute Rehab PT Goals Patient Stated Goal: to go home PT Goal Formulation: With patient Time For Goal Achievement: 05/06/18 Potential to Achieve Goals: Good Progress towards PT goals: Progressing toward goals    Frequency    Min 2X/week      PT Plan Current plan remains appropriate    Co-evaluation              AM-PAC PT "6 Clicks" Mobility   Outcome Measure  Help needed turning from your back to your side while in a flat bed without using bedrails?: A Little Help needed moving from lying on your back to sitting on the side of a flat bed without using bedrails?: A Little Help needed moving to and from a bed to a chair (including a wheelchair)?: A Little Help needed standing up from a chair using your arms (e.g., wheelchair or bedside chair)?: A Little Help needed to walk in hospital room?: A Lot Help needed climbing 3-5 steps with a railing? : Total 6 Click Score: 15    End of Session Equipment Utilized During Treatment: Gait belt Activity Tolerance: Patient tolerated treatment well Patient left: in chair;with call bell/phone within reach;with chair alarm set Nurse Communication: Mobility status PT Visit Diagnosis: Pain;Difficulty in walking, not elsewhere classified (R26.2) Pain - Right/Left: Right Pain - part of body: Ankle and joints of foot     Time: 0944-1000 PT Time Calculation (min) (ACUTE ONLY): 16 min  Charges:  $Therapeutic Activity: 8-22 mins                     [name], PT, DPT  Acute Rehabilitation Services  Pager: [phone] Office: [phone]    [name] 04/26/2018, 12:37 PM

## 2018-04-26 NOTE — Progress Notes (Signed)
Report called to [facility] to [place], Therapist, sports. No further questions

## 2018-04-26 NOTE — TOC Transition Note (Signed)
Transition of Care [facility]) - CM/SW Discharge Note   Patient Details  Name: Anniston Nellums MRN: [id number] Date of Birth: [birth year]  Transition of Care [facility]) CM/SW Contact:  Geralynn Ochs, LCSW Phone Number: 04/26/2018, 10:32 AM   Clinical Narrative:  Nurse to call report to [phone], Room 1202     Final next level of care: [place] Barriers to Discharge: No Barriers Identified   Patient Goals and CMS Choice Patient states their goals for this hospitalization and ongoing recovery are:: to go to rehab then go home CMS Medicare.gov Compare Post Acute Care list provided to:: Patient Choice offered to / list presented to : Patient  Discharge Placement              Patient chooses bed at: [facility] Patient to be transferred to facility by: [place] Name of family member notified: Daughter Chivere Patient and family notified of of transfer: 04/26/18  Discharge Plan and Services   Discharge Planning Services: NA Post Acute Care Choice: [place]          DME Arranged: N/A DME Agency: NA HH Arranged: NA HH Agency: NA   Social Determinants of Health (SDOH) Interventions     Readmission Risk Interventions No flowsheet data found.

## 2018-04-26 NOTE — Progress Notes (Signed)
Subjective: 1 Day Post-Op Procedure(s) (LRB): REPEAT DEBRIDEMENT RIGHT FOOT, SPLIT THICKNESS SKIN GRAFT, APPLY VAC (Right) Application Of Wound Vac Patient reports pain as mild. Currently on ampicillin sulbactam via picc line for mssa bacteremia and polymicrobial foot infection  Objective: Vital signs in last 24 hours: Temp:  [97.8 F (36.6 C)-98.4 F (36.9 C)] 97.8 F (36.6 C) (03/28 0507) Pulse Rate:  [75-113] 75 (03/28 0507) Resp:  [12-21] 16 (03/28 0507) BP: (123-177)/(73-95) 151/79 (03/28 0507) SpO2:  [90 %-99 %] 95 % (03/28 0015)  Intake/Output from previous day: 03/27 0701 - 03/28 0700 In: 990 [P.O.:480; I.V.:510] Out: 30 [Blood:30] Intake/Output this shift: No intake/output data recorded.  Recent Labs    04/24/18 0329 04/25/18 0448 04/26/18 0344  HGB 12.4 13.1 12.6   Recent Labs    04/25/18 0448 04/26/18 0344  WBC 7.6 10.8*  RBC 4.66 4.52  HCT 36.4 35.2*  PLT 455* 487*   Recent Labs    04/25/18 0448 04/26/18 0344  NA 143 138  K 3.1* 3.5  CL 102 101  CO2 28 26  BUN 5* 11  CREATININE 0.93 1.19*  GLUCOSE 113* 195*  CALCIUM 9.4 9.0   No results for input(s): LABPT, INR in the last 72 hours.  Neurologically intact Neurovascular intact Sensation intact distally Intact pulses distally Dorsiflexion/Plantar flexion intact Incision: dressing C/D/I No cellulitis present Compartment soft  Wound vac in place and functioning- 257mL blood in canister   Assessment/Plan: 1 Day Post-Op Procedure(s) (LRB): REPEAT DEBRIDEMENT RIGHT FOOT, SPLIT THICKNESS SKIN GRAFT, APPLY VAC (Right) Application Of Wound Vac Up with therapy Discharge to SNF  NWB RLE Continue with wound vac x 1 week Continue with iv abx per ID Follow up with Dr. Sharol Given one week post-op  Continue plan per medicine team Will likely need SNF at d/c       [facility] 04/26/2018, 8:05 AM

## 2018-04-26 NOTE — Progress Notes (Signed)
Pt d/c with p-tar to [facility] place. Prevena wound vac connected. PICC in place. No complaints from patient. No SOB or respiratory distress.

## 2018-04-26 NOTE — Plan of Care (Signed)

## 2018-04-28 ENCOUNTER — Encounter (HOSPITAL_COMMUNITY): Payer: Self-pay | Admitting: Orthopedic Surgery

## 2018-04-28 ENCOUNTER — Telehealth (INDEPENDENT_AMBULATORY_CARE_PROVIDER_SITE_OTHER): Payer: Self-pay | Admitting: *Deleted

## 2018-04-28 DIAGNOSIS — L089 Local infection of the skin and subcutaneous tissue, unspecified: Secondary | ICD-10-CM | POA: Diagnosis not present

## 2018-04-28 DIAGNOSIS — A4901 Methicillin susceptible Staphylococcus aureus infection, unspecified site: Secondary | ICD-10-CM | POA: Diagnosis not present

## 2018-04-28 DIAGNOSIS — E11628 Type 2 diabetes mellitus with other skin complications: Secondary | ICD-10-CM | POA: Diagnosis not present

## 2018-04-28 DIAGNOSIS — E119 Type 2 diabetes mellitus without complications: Secondary | ICD-10-CM | POA: Diagnosis not present

## 2018-04-28 LAB — AEROBIC/ANAEROBIC CULTURE (SURGICAL/DEEP WOUND)

## 2018-04-28 LAB — AEROBIC/ANAEROBIC CULTURE W GRAM STAIN (SURGICAL/DEEP WOUND)

## 2018-04-28 NOTE — Telephone Encounter (Signed)
Pt is in [place] and [place] RN states she has been put on the Provera Wound vac for R foot and states the canister is full and does not have a canister there in house that fits this type of wound vac. Wants to know if they can switch wound vac that they have in house. If not agreeable to this how do you want address this?   Please call [phone]

## 2018-04-28 NOTE — Telephone Encounter (Signed)
Yes remove the wound VAC dressing and apply a dry dressing change as needed.

## 2018-04-28 NOTE — Telephone Encounter (Signed)
SNF called back and [place] advise of orders below. Will call with any additional questions.

## 2018-04-28 NOTE — Telephone Encounter (Signed)
I called and tried to sw Tosha at [facility] no answer. Lm to cb.

## 2018-04-28 NOTE — Telephone Encounter (Signed)
This pt is s/p a I&D with skin graft to her foot. SNF called and states that canister is full. Ok to d/c vac and apply dry dressing daily or do you want to see the pt and remove in office?

## 2018-04-29 DIAGNOSIS — E11628 Type 2 diabetes mellitus with other skin complications: Secondary | ICD-10-CM | POA: Diagnosis not present

## 2018-04-29 DIAGNOSIS — B9561 Methicillin susceptible Staphylococcus aureus infection as the cause of diseases classified elsewhere: Secondary | ICD-10-CM | POA: Diagnosis not present

## 2018-04-29 DIAGNOSIS — R7881 Bacteremia: Secondary | ICD-10-CM | POA: Diagnosis not present

## 2018-04-29 DIAGNOSIS — T148XXA Other injury of unspecified body region, initial encounter: Secondary | ICD-10-CM | POA: Diagnosis not present

## 2018-05-07 ENCOUNTER — Other Ambulatory Visit: Payer: Self-pay | Admitting: *Deleted

## 2018-05-07 DIAGNOSIS — E11628 Type 2 diabetes mellitus with other skin complications: Secondary | ICD-10-CM | POA: Diagnosis not present

## 2018-05-07 DIAGNOSIS — B9561 Methicillin susceptible Staphylococcus aureus infection as the cause of diseases classified elsewhere: Secondary | ICD-10-CM | POA: Diagnosis not present

## 2018-05-07 DIAGNOSIS — T148XXA Other injury of unspecified body region, initial encounter: Secondary | ICD-10-CM | POA: Diagnosis not present

## 2018-05-07 DIAGNOSIS — R7881 Bacteremia: Secondary | ICD-10-CM | POA: Diagnosis not present

## 2018-05-07 NOTE — Patient Outreach (Signed)
[facility]) Care Management  05/07/2018  [name] [birth year] [id number]   Member assessed for [facility] Management services. [name] is currently at [facility] receiving rehab therapy.  Telephone call made to speak with [name] about [facility] Management services. She is agreeable and gave verbal consent for [facility] follow up for DM management.   [name] reports she lives alone but will have a dear friend come stay with her post SNF discharge. Denies having any concerns with her medications. She is uncertain of when she is discharging. She reports she is feeling stronger every day and is happy with her progress so far. Confirmed best contact number is [phone] Confirmed Primary Care Provider is Hannah Buffy, NP at [facility].  [name] also inquired about an electric wheelchair. Asked that she discussed this with the facility staff. Writer will pass this along to facility as well.   Writer will continue to follow for progression and disposition plans and will collaborate with [facility] UM and facility discharge planner.  Will make referral to [facility] closer to SNF discharge.    Hannah Rolling, MSN-Ed, RN,BSN [place] Acute Care Coordinator [phone]

## 2018-05-08 ENCOUNTER — Other Ambulatory Visit: Payer: Self-pay

## 2018-05-08 ENCOUNTER — Encounter (INDEPENDENT_AMBULATORY_CARE_PROVIDER_SITE_OTHER): Payer: Self-pay | Admitting: Orthopedic Surgery

## 2018-05-08 ENCOUNTER — Ambulatory Visit (INDEPENDENT_AMBULATORY_CARE_PROVIDER_SITE_OTHER): Payer: Medicare Other | Admitting: Physician Assistant

## 2018-05-08 VITALS — Ht 64.0 in | Wt 202.0 lb

## 2018-05-08 DIAGNOSIS — L97512 Non-pressure chronic ulcer of other part of right foot with fat layer exposed: Secondary | ICD-10-CM

## 2018-05-08 DIAGNOSIS — Z945 Skin transplant status: Secondary | ICD-10-CM

## 2018-05-08 DIAGNOSIS — E876 Hypokalemia: Secondary | ICD-10-CM | POA: Diagnosis not present

## 2018-05-08 DIAGNOSIS — L089 Local infection of the skin and subcutaneous tissue, unspecified: Secondary | ICD-10-CM | POA: Diagnosis not present

## 2018-05-11 ENCOUNTER — Encounter (INDEPENDENT_AMBULATORY_CARE_PROVIDER_SITE_OTHER): Payer: Self-pay | Admitting: Physician Assistant

## 2018-05-11 NOTE — Progress Notes (Signed)
Office Visit Note   Patient: [name]           Date of Birth: Jun 30, 1940           MRN: [id number] Visit Date: 05/08/2018              Requested by: Flossie Buffy, [place], [place] [address] PCP: Flossie Buffy, NP  Chief Complaint  Patient presents with  . Right Foot - Routine Post Op    04/22/18 repeat I&D right foot and STSG      HPI: The patient is a 78 year old female who is seen for postoperative follow-up following right foot debridements with placement of a split thickness skin graft on 04/25/2018.  She is in skilled nursing at [facility] facility.  She is receiving IV antibiotics through PICC line, ampicillin and ceftezole and.  She is nonweightbearing through right lower extremity.  She had the [facility] on for a week and this was removed and they have been applying a dry dressing over the area.  Assessment & Plan: Visit Diagnoses:  1. S/P split thickness skin graft   2. Right foot ulcer, with fat layer exposed ([place])     Plan: Orders were sent for the nursing facility to start Silvadene cream to the graft site and cover with gauze and Ace wrap daily.  They can wash the graft site and the foot daily with soap and water and use lotion on the rest of the foot avoiding the graft but Silvadene cream to the actual graft site.  The patient will follow-up in 2 weeks or sooner should she have difficulties in the interim.  She should continue nonweightbearing as much as possible.  Follow-Up Instructions: Return in about 2 weeks (around 05/22/2018).   Ortho Exam  Patient is alert, oriented, no adenopathy, well-dressed, normal affect, normal respiratory effort. The right foot dorsal lateral graft is incorporating well with pink granulation showing through the meshing of the graft.  She has a good palpable pedal pulse.  There are no signs of residual cellulitis or infection currently.  Staples are intact.  Imaging:  No results found.   Labs: Lab Results  Component Value Date   HGBA1C 13.5 (H) 04/17/2018   HGBA1C 12.5 (H) 03/09/2016   ESRSEDRATE 56 (H) 04/20/2018   REPTSTATUS 04/28/2018 FINAL 04/22/2018   GRAMSTAIN  04/22/2018    MODERATE WBC PRESENT, PREDOMINANTLY PMN MODERATE GRAM POSITIVE COCCI RARE GRAM POSITIVE RODS    CULT  04/22/2018    MODERATE GROUP B STREP(S.AGALACTIAE)ISOLATED TESTING AGAINST S. AGALACTIAE NOT ROUTINELY PERFORMED DUE TO PREDICTABILITY OF AMP/PEN/VAN SUSCEPTIBILITY. RARE STAPHYLOCOCCUS AUREUS NO ANAEROBES ISOLATED Performed at [facility], [place] [address] [place], [place] [address]    LABORGA STAPHYLOCOCCUS AUREUS 04/22/2018     Lab Results  Component Value Date   ALBUMIN 3.5 04/20/2018   ALBUMIN 4.0 04/17/2018   ALBUMIN 3.0 (L) 01/30/2015    Body mass index is 34.67 kg/m.  Orders:  No orders of the defined types were placed in this encounter.  No orders of the defined types were placed in this encounter.    Procedures: No procedures performed  Clinical Data: No additional findings.  ROS:  All other systems negative, except as noted in the HPI. Review of Systems  Objective: Vital Signs: Ht 5\' 4"  (1.626 m)   Wt 202 lb (91.6 kg)   BMI 34.67 kg/m   Specialty Comments:  No specialty comments available.  PMFS History: Patient Active Problem List   Diagnosis Date Noted  . 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 AKI (acute kidney injury) ([place])   . Bacteremia due to methicillin susceptible Staphylococcus aureus (MSSA) 04/21/2018  . Abscess of foot   . Diabetic foot infection ([place]) 04/20/2018  . Iron deficiency 04/18/2018  . Foreign body in right foot 04/18/2018  . Cellulitis of right leg 04/18/2018  . Uncontrolled diabetes mellitus ([place]) 04/12/2016  . Vitamin D deficiency 03/06/2016  . 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 Essential hypertension 03/21/2015  . Vision decreased 03/21/2015  . Tension-type headache, not  intractable 03/21/2015  . Anemia due to chronic blood loss 03/16/2015  . Morbid obesity ([place]) 02/02/2015  . Anemia 01/30/2015   Past Medical History:  Diagnosis Date  . Abdominal distension   . Anemia   . Anxiety   . B12 deficiency   . Balance problem   . Blood in stool   . Cancer (HCC)    OVARIAN  . Colon polyps   . Dementia ([place])    EARLY  . Dementia ([place])    EARLY  . Depression   . GERD (gastroesophageal reflux disease)   . History of blood transfusion   . Hypertension   . Hypomagnesemia   . Urine incontinence     Family History  Family history unknown: Yes    Past Surgical History:  Procedure Laterality Date  . ABDOMINAL HYSTERECTOMY    . APPLICATION OF WOUND VAC  04/25/2018   Procedure: Application Of Wound Vac;  Surgeon: Newt Minion, MD;  Location: [facility];  Service: Orthopedics;;  . CATARACT EXTRACTION W/PHACO Right 07/26/2015   Procedure: CATARACT EXTRACTION PHACO AND INTRAOCULAR LENS PLACEMENT ([place]);  Surgeon: Birder Robson, MD;  Location: [facility];  Service: Ophthalmology;  Laterality: Right;  [name]   1:03.5 AP%  23% CDE   14.59 fluid pack lot # [id number]  . CATARACT EXTRACTION W/PHACO Left 08/29/2015   Procedure: CATARACT EXTRACTION PHACO AND INTRAOCULAR LENS PLACEMENT ([place]);  Surgeon: Birder Robson, MD;  Location: [facility];  Service: Ophthalmology;  Laterality: Left;  [name] 00:40 AP% 20.9 CDE 8.50 fluid pack lot # [id number]  . COLONOSCOPY WITH PROPOFOL N/A 03/14/2015   Procedure: COLONOSCOPY WITH PROPOFOL;  Surgeon: Hulen Luster, MD;  Location: [facility] ENDOSCOPY;  Service: Gastroenterology;  Laterality: N/A;  . ESOPHAGOGASTRODUODENOSCOPY (EGD) WITH PROPOFOL N/A 03/14/2015   Procedure: ESOPHAGOGASTRODUODENOSCOPY (EGD) WITH PROPOFOL;  Surgeon: Hulen Luster, MD;  Location: [facility];  Service: Gastroenterology;  Laterality: N/A;  . I&D EXTREMITY Right 04/22/2018   Procedure: RIGHT FOOT DEBRIDEMENT AND FOREIGN BODY REMOVAL;  Surgeon: Newt Minion, MD;  Location: [facility];  Service: Orthopedics;  Laterality: Right;  . none    . SKIN SPLIT GRAFT Right 04/25/2018   Procedure: REPEAT DEBRIDEMENT RIGHT FOOT, SPLIT THICKNESS SKIN GRAFT, APPLY VAC;  Surgeon: Newt Minion, MD;  Location: [facility];  Service: Orthopedics;  Laterality: Right;  . TOTAL ABDOMINAL HYSTERECTOMY W/ BILATERAL SALPINGOOPHORECTOMY  04/19/2015   exploratory laparoscopy, total abdominal hysterectomy, bilateral salping oophorectomy, left pelvic lymph node dissection, omentectomy   Social History   Occupational History  . Not on file  Tobacco Use  . Smoking status: Never Smoker  . Smokeless tobacco: Never Used  Substance and Sexual Activity  . Alcohol use: No  . Drug use: No  . Sexual activity: Not Currently

## 2018-05-12 DIAGNOSIS — I10 Essential (primary) hypertension: Secondary | ICD-10-CM | POA: Diagnosis not present

## 2018-05-12 DIAGNOSIS — W19XXXA Unspecified fall, initial encounter: Secondary | ICD-10-CM | POA: Diagnosis not present

## 2018-05-15 ENCOUNTER — Other Ambulatory Visit: Payer: Self-pay | Admitting: *Deleted

## 2018-05-15 ENCOUNTER — Encounter (HOSPITAL_COMMUNITY): Payer: Self-pay | Admitting: Orthopedic Surgery

## 2018-05-15 NOTE — Patient Outreach (Signed)
[facility]) Care Management  05/15/2018  Cadience Bradfield Jul 22, 1940 [id number]    Member remains at [facility] for rehab.  Spoke with [facility] UM after the telephonic IDT meeting between facility and [facility] UM. Writer made aware that facility has scheduled care planning meeting today with family. Member reportedly is impulsive with therapy. Facility to discuss ALF with member and family. She lived alone prior to admission.  Will continue to follow along for disposition plans and progression.   Plan continued collaboration with [facility] UM.  Plan to make referral to [facility] if member does discharges to home.   [name], MSN-Ed, RN,BSN [place] Acute Care Coordinator [phone]

## 2018-05-16 DIAGNOSIS — I10 Essential (primary) hypertension: Secondary | ICD-10-CM | POA: Diagnosis not present

## 2018-05-16 DIAGNOSIS — E11628 Type 2 diabetes mellitus with other skin complications: Secondary | ICD-10-CM | POA: Diagnosis not present

## 2018-05-16 DIAGNOSIS — B9561 Methicillin susceptible Staphylococcus aureus infection as the cause of diseases classified elsewhere: Secondary | ICD-10-CM | POA: Diagnosis not present

## 2018-05-16 DIAGNOSIS — R7881 Bacteremia: Secondary | ICD-10-CM | POA: Diagnosis not present

## 2018-05-21 ENCOUNTER — Telehealth (INDEPENDENT_AMBULATORY_CARE_PROVIDER_SITE_OTHER): Payer: Self-pay

## 2018-05-21 NOTE — Telephone Encounter (Signed)
I called and advised reception that we have been calling and leaving messages for several days to try and move appt up for this pt. She is sch for post op appt right foot STSG tomorrow at 1:15. I advised that hours of operation have been reduced and we would like to see her tomorrow at 10:45. No one has called [name] back to confirm and if they send the pt tomorrow at the 1:15 time the office will be closed. Advised he will check with scheduling and have some one to call us back. Asked that they selection triage line to confirm.

## 2018-05-22 ENCOUNTER — Other Ambulatory Visit: Payer: Self-pay | Admitting: *Deleted

## 2018-05-22 ENCOUNTER — Other Ambulatory Visit: Payer: Self-pay

## 2018-05-22 ENCOUNTER — Ambulatory Visit (INDEPENDENT_AMBULATORY_CARE_PROVIDER_SITE_OTHER): Payer: Medicare Other | Admitting: Physician Assistant

## 2018-05-22 ENCOUNTER — Encounter (INDEPENDENT_AMBULATORY_CARE_PROVIDER_SITE_OTHER): Payer: Self-pay | Admitting: Orthopedic Surgery

## 2018-05-22 ENCOUNTER — Ambulatory Visit (INDEPENDENT_AMBULATORY_CARE_PROVIDER_SITE_OTHER): Payer: Medicare Other | Admitting: Orthopedic Surgery

## 2018-05-22 VITALS — Ht 64.0 in | Wt 202.0 lb

## 2018-05-22 DIAGNOSIS — L97512 Non-pressure chronic ulcer of other part of right foot with fat layer exposed: Secondary | ICD-10-CM

## 2018-05-22 DIAGNOSIS — L089 Local infection of the skin and subcutaneous tissue, unspecified: Secondary | ICD-10-CM | POA: Diagnosis not present

## 2018-05-22 DIAGNOSIS — R52 Pain, unspecified: Secondary | ICD-10-CM | POA: Diagnosis not present

## 2018-05-22 DIAGNOSIS — Z945 Skin transplant status: Secondary | ICD-10-CM

## 2018-05-22 NOTE — Patient Outreach (Signed)
[facility]) Care Management  05/22/2018  Drishti Pepperman 02-24-1940 [id number]   Discussed member with [facility] UM RN after her IDT telephonic meeting with [facility] SNF staff.  Ms. Dome remains at [facility]. Disposition plans remains home with 24/7 support of friend. Member is now on iv abx for infection.   Member has been adamant about returning home .   Writer has previously spoken with Ms. Beed  about [place] Management services.  Will continue to follow for disposition plans and progression.  Will make referral to [facility] if member discharges to home.  Will continue ongoing collaboration with [facility] UM RN.   [name], MSN-Ed, RN,BSN [place] Acute Care Coordinator [phone]

## 2018-05-22 NOTE — Progress Notes (Signed)
Office Visit Note   Patient: [name]           Date of Birth: Apr 22, 1940           MRN: [id number] Visit Date: 05/22/2018              Requested by: Flossie Buffy, [place], [place] [address] PCP: Flossie Buffy, NP  Chief Complaint  Patient presents with  . Right Foot - Routine Post Op    04/22/2018 repeat I&D STSG       HPI: The patient is a 78 year old woman who underwent debridement of her right foot and placement of a split thickness skin graft, allograft to the residual wound.  She has been getting Silvadene dressing changes every other day and is nonweightbearing over the foot with a postop shoe.  She is currently at [facility] facility.  Assessment & Plan: Visit Diagnoses:  1. S/P split thickness skin graft   2. Right foot ulcer, with fat layer exposed ([place])     Plan: Staples were harvested this visit.  They should continue with every other day dressing changes with Silvadene cream 4 x 4 gauze and Ace wrapping for edema control.  She will follow-up in 2 weeks.  Follow-Up Instructions: Return in about 2 weeks (around 06/05/2018).   Ortho Exam  Patient is alert, oriented, no adenopathy, well-dressed, normal affect, normal respiratory effort. The right lateral foot graft is incorporating well and is adherent to the wound bed.  There signs of good pink granulation through the meshing of the residual graft.  There are no signs of infection or cellulitis.  There is no edema.  She has good palpable pedal pulses.  Staples were harvested this visit.  Imaging: No results found.   Labs: Lab Results  Component Value Date   HGBA1C 13.5 (H) 04/17/2018   HGBA1C 12.5 (H) 03/09/2016   ESRSEDRATE 56 (H) 04/20/2018   REPTSTATUS 04/28/2018 FINAL 04/22/2018   GRAMSTAIN  04/22/2018    MODERATE WBC PRESENT, PREDOMINANTLY PMN MODERATE GRAM POSITIVE COCCI RARE GRAM POSITIVE RODS    CULT  04/22/2018    MODERATE GROUP  B STREP(S.AGALACTIAE)ISOLATED TESTING AGAINST S. AGALACTIAE NOT ROUTINELY PERFORMED DUE TO PREDICTABILITY OF AMP/PEN/VAN SUSCEPTIBILITY. RARE STAPHYLOCOCCUS AUREUS NO ANAEROBES ISOLATED Performed at [facility], [place] [address], [place],  [address]    LABORGA STAPHYLOCOCCUS AUREUS 04/22/2018     Lab Results  Component Value Date   ALBUMIN 3.5 04/20/2018   ALBUMIN 4.0 04/17/2018   ALBUMIN 3.0 (L) 01/30/2015    Body mass index is 34.67 kg/m.  Orders:  No orders of the defined types were placed in this encounter.  No orders of the defined types were placed in this encounter.    Procedures: No procedures performed  Clinical Data: No additional findings.  ROS:  All other systems negative, except as noted in the HPI. Review of Systems  Objective: Vital Signs: Ht 5\' 4"  (1.626 m)   Wt 202 lb (91.6 kg)   BMI 34.67 kg/m   Specialty Comments:  No specialty comments available.  PMFS History: Patient Active Problem List   Diagnosis Date Noted  . 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 AKI (acute kidney injury) ([place])   . Bacteremia due to methicillin susceptible Staphylococcus aureus (MSSA) 04/21/2018  . Abscess of foot   . Diabetic foot infection ([place]) 04/20/2018  . Iron deficiency 04/18/2018  . Foreign body in right foot 04/18/2018  .  Cellulitis of right leg 04/18/2018  . Uncontrolled diabetes mellitus ([place]) 04/12/2016  . Vitamin D deficiency 03/06/2016  . 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 Essential hypertension 03/21/2015  . Vision decreased 03/21/2015  . Tension-type headache, not intractable 03/21/2015  . Anemia due to chronic blood loss 03/16/2015  . Morbid obesity ([place]) 02/02/2015  . Anemia 01/30/2015   Past Medical History:  Diagnosis Date  . Abdominal distension   . Anemia   . Anxiety   . B12 deficiency   . Balance problem   . Blood in stool   . Cancer (HCC)    OVARIAN  . Colon polyps   . Dementia ([place])    EARLY  . Dementia  ([place])    EARLY  . Depression   . GERD (gastroesophageal reflux disease)   . History of blood transfusion   . Hypertension   . Hypomagnesemia   . Urine incontinence     Family History  Family history unknown: Yes    Past Surgical History:  Procedure Laterality Date  . ABDOMINAL HYSTERECTOMY    . APPLICATION OF WOUND VAC  04/25/2018   Procedure: Application Of Wound Vac;  Surgeon: Newt Minion, MD;  Location: [facility];  Service: Orthopedics;;  . CATARACT EXTRACTION W/PHACO Right 07/26/2015   Procedure: CATARACT EXTRACTION PHACO AND INTRAOCULAR LENS PLACEMENT ([place]);  Surgeon: Birder Robson, MD;  Location: [facility];  Service: Ophthalmology;  Laterality: Right;  [name]   1:03.5 AP%  23% CDE   14.59 fluid pack lot # [id number]  . CATARACT EXTRACTION W/PHACO Left 08/29/2015   Procedure: CATARACT EXTRACTION PHACO AND INTRAOCULAR LENS PLACEMENT ([place]);  Surgeon: Birder Robson, MD;  Location: [facility];  Service: Ophthalmology;  Laterality: Left;  [name] 00:40 AP% 20.9 CDE 8.50 fluid pack lot # [id number]  . COLONOSCOPY WITH PROPOFOL N/A 03/14/2015   Procedure: COLONOSCOPY WITH PROPOFOL;  Surgeon: Hulen Luster, MD;  Location[facility];  Service: Gastroenterology;  Laterality: N/A;  . ESOPHAGOGASTRODUODENOSCOPY (EGD) WITH PROPOFOL N/A 03/14/2015   Procedure: ESOPHAGOGASTRODUODENOSCOPY (EGD) WITH PROPOFOL;  Surgeon: Hulen Luster, MD;  Location: [facility]COPY;  Service: Gastroenterology;  Laterality: N/A;  . I&D EXTREMITY Right 04/22/2018   Procedure: RIGHT FOOT DEBRIDEMENT AND FOREIGN BODY REMOVAL;  Surgeon: Newt Minion, MD;  Location: [place];  Service: Orthopedics;  Laterality: Right;  . none    . SKIN SPLIT GRAFT Right 04/25/2018   Procedure: REPEAT DEBRIDEMENT RIGHT FOOT, SPLIT THICKNESS SKIN GRAFT, APPLY VAC;  Surgeon: Newt Minion, MD;  Location: [place];  Service: Orthopedics;  Laterality: Right;  . TOTAL ABDOMINAL HYSTERECTOMY W/ BILATERAL SALPINGOOPHORECTOMY  04/19/2015   exploratory laparoscopy,  total abdominal hysterectomy, bilateral salping oophorectomy, left pelvic lymph node dissection, omentectomy   Social History   Occupational History  . Not on file  Tobacco Use  . Smoking status: Never Smoker  . Smokeless tobacco: Never Used  Substance and Sexual Activity  . Alcohol use: No  . Drug use: No  . Sexual activity: Not Currently

## 2018-05-27 DIAGNOSIS — I10 Essential (primary) hypertension: Secondary | ICD-10-CM | POA: Diagnosis not present

## 2018-05-27 DIAGNOSIS — E11628 Type 2 diabetes mellitus with other skin complications: Secondary | ICD-10-CM | POA: Diagnosis not present

## 2018-05-27 DIAGNOSIS — T148XXA Other injury of unspecified body region, initial encounter: Secondary | ICD-10-CM | POA: Diagnosis not present

## 2018-05-27 DIAGNOSIS — R52 Pain, unspecified: Secondary | ICD-10-CM | POA: Diagnosis not present

## 2018-05-28 DIAGNOSIS — E876 Hypokalemia: Secondary | ICD-10-CM | POA: Diagnosis not present

## 2018-06-03 ENCOUNTER — Other Ambulatory Visit: Payer: Self-pay

## 2018-06-03 ENCOUNTER — Encounter: Payer: Medicare Other | Admitting: Internal Medicine

## 2018-06-05 ENCOUNTER — Encounter: Payer: Self-pay | Admitting: Orthopedic Surgery

## 2018-06-05 ENCOUNTER — Other Ambulatory Visit: Payer: Self-pay

## 2018-06-05 ENCOUNTER — Ambulatory Visit (INDEPENDENT_AMBULATORY_CARE_PROVIDER_SITE_OTHER): Payer: Medicare Other | Admitting: Orthopedic Surgery

## 2018-06-05 VITALS — Ht 64.0 in | Wt 202.0 lb

## 2018-06-05 DIAGNOSIS — Z945 Skin transplant status: Secondary | ICD-10-CM

## 2018-06-05 DIAGNOSIS — L97512 Non-pressure chronic ulcer of other part of right foot with fat layer exposed: Secondary | ICD-10-CM

## 2018-06-05 NOTE — Progress Notes (Addendum)
Office Visit Note   Patient: [name]           Date of Birth: December 29, 1940           MRN: [id number] Visit Date: 06/05/2018              Requested by: Flossie Buffy, [place],  [address] PCP: Flossie Buffy, NP  Chief Complaint  Patient presents with  . Right Foot - Routine Post Op    04/22/18 right foot repeat I&D STSG      HPI: The patient is a 78 yo woman who is seen for post operative follow up following debridement of her right foot and placement of a split thickness skin graft, allograft to the residual wound on 04/25/2018. She is residing at [facility]. She has been non weight bearing and nursing is doing silvadene dressing changes every other day.  She remains on IV cefazolin through 06/02/2018 for MSSA.  She has several toenails on each foot and cannot safely trim this and the SNF cannot safely trim them and she requests nail trim today.  Assessment & Plan: Visit Diagnoses:  1. S/P split thickness skin graft   2. Right foot ulcer, with fat layer exposed ([place])     Plan: Orders sent to [facility] to begin washing the right foot daily with soap and water and then apply dry dressing to the wound bed daily for the next couple of weeks. Continue non weight bearing.  Several toe nails were trimmed today, total of 5 were trimmed today as the patient cannot safely trim her nails and she tolerated this well.  Follow up in 2 weeks.   Follow-Up Instructions: Return in about 2 weeks (around 06/19/2018).   Ortho Exam  Patient is alert, oriented, no adenopathy, well-dressed, normal affect, normal respiratory effort. The right lateral skin graft has incorporated , excess graft from wound borders debrided today. No signs of infection or peri wound breakdown, but wound bed too moist and with yellow slough. Wound bed cleaned with saline and gauze and some bleeding noted following this. Will change to dry dressings daily for  now. Palpable pedal pulses.   Imaging: No results found. No images are attached to the encounter.  Labs: Lab Results  Component Value Date   HGBA1C 13.5 (H) 04/17/2018   HGBA1C 12.5 (H) 03/09/2016   ESRSEDRATE 56 (H) 04/20/2018   REPTSTATUS 04/28/2018 FINAL 04/22/2018   GRAMSTAIN  04/22/2018    MODERATE WBC PRESENT, PREDOMINANTLY PMN MODERATE GRAM POSITIVE COCCI RARE GRAM POSITIVE RODS    CULT  04/22/2018    MODERATE GROUP B STREP(S.AGALACTIAE)ISOLATED TESTING AGAINST S. AGALACTIAE NOT ROUTINELY PERFORMED DUE TO PREDICTABILITY OF AMP/PEN/VAN SUSCEPTIBILITY. RARE STAPHYLOCOCCUS AUREUS NO ANAEROBES ISOLATED Performed at [facility], [place] [address], [place],  [address]    LABORGA STAPHYLOCOCCUS AUREUS 04/22/2018     Lab Results  Component Value Date   ALBUMIN 3.5 04/20/2018   ALBUMIN 4.0 04/17/2018   ALBUMIN 3.0 (L) 01/30/2015    Body mass index is 34.67 kg/m.  Orders:  No orders of the defined types were placed in this encounter.  No orders of the defined types were placed in this encounter.    Procedures: No procedures performed  Clinical Data: No additional findings.  ROS:  All other systems negative, except as noted in the HPI. Review of Systems  Objective: Vital Signs: Ht 5\' 4"  (1.626 m)   Wt 202 lb (  91.6 kg)   BMI 34.67 kg/m   Specialty Comments:  No specialty comments available.  PMFS History: Patient Active Problem List   Diagnosis Date Noted  . 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 AKI (acute kidney injury) ([place])   . Bacteremia due to methicillin susceptible Staphylococcus aureus (MSSA) 04/21/2018  . Abscess of foot   . Diabetic foot infection ([place]) 04/20/2018  . Iron deficiency 04/18/2018  . Foreign body in right foot 04/18/2018  . Cellulitis of right leg 04/18/2018  . Uncontrolled diabetes mellitus ([place]) 04/12/2016  . Vitamin D deficiency 03/06/2016  . 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 Essential hypertension  03/21/2015  . Vision decreased 03/21/2015  . Tension-type headache, not intractable 03/21/2015  . Anemia due to chronic blood loss 03/16/2015  . Morbid obesity ([place]) 02/02/2015  . Anemia 01/30/2015   Past Medical History:  Diagnosis Date  . Abdominal distension   . Anemia   . Anxiety   . B12 deficiency   . Balance problem   . Blood in stool   . Cancer (HCC)    OVARIAN  . Colon polyps   . Dementia ([place])    EARLY  . Dementia ([place])    EARLY  . Depression   . GERD (gastroesophageal reflux disease)   . History of blood transfusion   . Hypertension   . Hypomagnesemia   . Urine incontinence     Family History  Family history unknown: Yes    Past Surgical History:  Procedure Laterality Date  . ABDOMINAL HYSTERECTOMY    . APPLICATION OF WOUND VAC  04/25/2018   Procedure: Application Of Wound Vac;  Surgeon: Newt Minion, MD;  Location: [place];  Service: Orthopedics;;  . CATARACT EXTRACTION W/PHACO Right 07/26/2015   Procedure: CATARACT EXTRACTION PHACO AND INTRAOCULAR LENS PLACEMENT ([place]);  Surgeon: Birder Robson, MD;  Location: [facility];  Service: Ophthalmology;  Laterality: Right;  [name]   1:03.5 AP%  23% CDE   14.59 fluid pack lot # [id number]  . CATARACT EXTRACTION W/PHACO Left 08/29/2015   Procedure: CATARACT EXTRACTION PHACO AND INTRAOCULAR LENS PLACEMENT ([place]);  Surgeon: Birder Robson, MD;  Location: [facility];  Service: Ophthalmology;  Laterality: Left;  [name] 00:40 AP% 20.9 CDE 8.50 fluid pack lot # [id number]  . COLONOSCOPY WITH PROPOFOL N/A 03/14/2015   Procedure: COLONOSCOPY WITH PROPOFOL;  Surgeon: Hulen Luster, MD;  Location: [facility] ENDOSCOPY;  Service: Gastroenterology;  Laterality: N/A;  . ESOPHAGOGASTRODUODENOSCOPY (EGD) WITH PROPOFOL N/A 03/14/2015   Procedure: ESOPHAGOGASTRODUODENOSCOPY (EGD) WITH PROPOFOL;  Surgeon: Hulen Luster, MD;  [facility];  Service: Gastroenterology;  Laterality: N/A;  . I&D EXTREMITY Right 04/22/2018   Procedure: RIGHT FOOT  DEBRIDEMENT AND FOREIGN BODY REMOVAL;  Surgeon: Newt Minion, MD;  Location: [facility];  Service: Orthopedics;  Laterality: Right;  . none    . SKIN SPLIT GRAFT Right 04/25/2018   Procedure: REPEAT DEBRIDEMENT RIGHT FOOT, SPLIT THICKNESS SKIN GRAFT, APPLY VAC;  Surgeon: Newt Minion, MD;  Location: [place] [facility];  Service: Orthopedics;  Laterality: Right;  . TOTAL ABDOMINAL HYSTERECTOMY W/ BILATERAL SALPINGOOPHORECTOMY  04/19/2015   exploratory laparoscopy, total abdominal hysterectomy, bilateral salping oophorectomy, left pelvic lymph node dissection, omentectomy   Social History   Occupational History  . Not on file  Tobacco Use  . Smoking status: Never Smoker  . Smokeless tobacco: Never Used  Substance and Sexual Activity  . Alcohol use: No  . Drug use: No  . Sexual activity: Not Currently

## 2018-06-06 ENCOUNTER — Telehealth: Payer: Self-pay | Admitting: Orthopedic Surgery

## 2018-06-06 NOTE — Telephone Encounter (Signed)
I called and tried to sw nurse. I was transferred to the hall but no one answered the phone I then called back and no answer. The pt was seen in the office yesterday and was to continue to apply silvadene ointment to her surgical site every other day and she should also be non weight bearing on this foot until her next follow up appt. I will continue to keep this message in my box and try to reach facility.

## 2018-06-06 NOTE — Telephone Encounter (Signed)
Nurse called asking if it was okay to Dc Silvadean? Also if pt can be weight baring on surgical foot?  [phone]

## 2018-06-09 NOTE — Telephone Encounter (Signed)
I tried to call [name] again, transfer to unknown ext and then no answer. Could not lvm and also tried to lvm on nurse supervisor number but could not do that.

## 2018-06-10 DIAGNOSIS — E11628 Type 2 diabetes mellitus with other skin complications: Secondary | ICD-10-CM | POA: Diagnosis not present

## 2018-06-13 DIAGNOSIS — L089 Local infection of the skin and subcutaneous tissue, unspecified: Secondary | ICD-10-CM | POA: Diagnosis not present

## 2018-06-13 DIAGNOSIS — R52 Pain, unspecified: Secondary | ICD-10-CM | POA: Diagnosis not present

## 2018-06-13 NOTE — Telephone Encounter (Signed)
I tried to call patient again and inform nurse, spoke to 2 people and got transferred to number that kept ringing, no answer but receptionist took call and stated he will get someone(Nurse) to return our call about our patient. We have tried to contact someone from this facility several times concerning the nurse message below. If the nurse would return our call please update them, otherwise the patient does has upcoming appt on 06/19/18, thank you.

## 2018-06-16 ENCOUNTER — Other Ambulatory Visit: Payer: Self-pay

## 2018-06-16 ENCOUNTER — Other Ambulatory Visit: Payer: Self-pay | Admitting: *Deleted

## 2018-06-16 DIAGNOSIS — I10 Essential (primary) hypertension: Secondary | ICD-10-CM

## 2018-06-16 NOTE — Patient Outreach (Addendum)
[facility]) Care Management  06/16/2018  [name] 1940-08-20 [id number]    Telephone call made to [name] at [phone] to discuss [place] Management follow up. Patient identifiers confirmed. Discussed that Probation officer spoke with member in early April. [name] states she remains agreeable to [facility] services.   [name] confirms that she is slated for discharge to home on 06/17/18. States her friend will come to stay with her 24/7 post SNF discharge. States Jayley Hustead will take her to MD appointments.   [name] reports that she will have to throw out all of her food in the refrigerator because she has not been home since March. States food will be a concern for her. Asked if her caregiver will cook. She states her friend will cook but she will not have any food to cook when member gets home initially. Request meal assistance if available.   [name] states she will need "lots of education about diabetes, I was just recently diagnosed with diabetes, I need all the education I can get about it."  Confirmed Primary Care Provider is Wilfred Lacy, NP at [facility].  Confirmed best contact number for [name] is her cell at [phone].  Confirmed [name] address (po box listed in epic) as [address] [place] [place],  [address].  [name] is agreeable to [facility] follow up for complex case management and DM education. [facility] social worker assistance for meals.  Explained [facility] Care Management will not interfere or replace services provided by home health.   Will place referral for the above [place] Management disciplines. Noted, [name] is slated for discharge on tomorrow 06/17/18 from [facility].   [name] expressed appreciation of the call.   [name], MSN-Ed, RN,BSN [place] Acute Care Coordinator [phone]

## 2018-06-16 NOTE — Patient Outreach (Signed)
[facility]) Care Management  06/16/2018  Royann Ezabella Teska [birth year] [id number]   Unsuccessful outreach to patient regarding social work referral for meal assistance.  Patient scheduled for discharge from [facility] on 06/17/18.  BSW left voicemail message.  Will attempt to reach again tomorrow.  [name], BSW Social Worker [phone]

## 2018-06-17 ENCOUNTER — Ambulatory Visit: Payer: Self-pay

## 2018-06-17 ENCOUNTER — Other Ambulatory Visit: Payer: Self-pay

## 2018-06-17 DIAGNOSIS — B9561 Methicillin susceptible Staphylococcus aureus infection as the cause of diseases classified elsewhere: Secondary | ICD-10-CM | POA: Diagnosis not present

## 2018-06-17 DIAGNOSIS — L089 Local infection of the skin and subcutaneous tissue, unspecified: Secondary | ICD-10-CM | POA: Diagnosis not present

## 2018-06-17 DIAGNOSIS — R52 Pain, unspecified: Secondary | ICD-10-CM | POA: Diagnosis not present

## 2018-06-17 NOTE — Patient Outreach (Signed)
[place] [facility]) Care Management  06/17/2018  [name] Nov 28, 1940 [id number]   Second unsuccessful outreach to patient regarding social work referral for meal assistance.  Patient scheduled for discharge from [facility] on 06/17/18.  BSW left voicemail message and mailed unsuccessful outreach letter. Will attempt to reach again tomorrow.  [name], BSW Social Worker [phone]

## 2018-06-17 NOTE — Patient Outreach (Signed)
[name]) Care Management  06/17/2018  Shoua Eather Chaires 13-Apr-1940 [id number]  Encounter opened in error

## 2018-06-18 ENCOUNTER — Other Ambulatory Visit: Payer: Self-pay | Admitting: *Deleted

## 2018-06-18 ENCOUNTER — Telehealth: Payer: Self-pay | Admitting: Orthopedic Surgery

## 2018-06-18 ENCOUNTER — Other Ambulatory Visit: Payer: Self-pay

## 2018-06-18 ENCOUNTER — Ambulatory Visit: Payer: Self-pay

## 2018-06-18 DIAGNOSIS — I358 Other nonrheumatic aortic valve disorders: Secondary | ICD-10-CM | POA: Diagnosis not present

## 2018-06-18 DIAGNOSIS — I10 Essential (primary) hypertension: Secondary | ICD-10-CM | POA: Diagnosis not present

## 2018-06-18 DIAGNOSIS — E876 Hypokalemia: Secondary | ICD-10-CM | POA: Diagnosis not present

## 2018-06-18 DIAGNOSIS — E11628 Type 2 diabetes mellitus with other skin complications: Secondary | ICD-10-CM | POA: Diagnosis not present

## 2018-06-18 DIAGNOSIS — Z8543 Personal history of malignant neoplasm of ovary: Secondary | ICD-10-CM | POA: Diagnosis not present

## 2018-06-18 DIAGNOSIS — Z794 Long term (current) use of insulin: Secondary | ICD-10-CM | POA: Diagnosis not present

## 2018-06-18 DIAGNOSIS — Z6834 Body mass index (BMI) 34.0-34.9, adult: Secondary | ICD-10-CM | POA: Diagnosis not present

## 2018-06-18 DIAGNOSIS — E1165 Type 2 diabetes mellitus with hyperglycemia: Secondary | ICD-10-CM | POA: Diagnosis not present

## 2018-06-18 DIAGNOSIS — K219 Gastro-esophageal reflux disease without esophagitis: Secondary | ICD-10-CM | POA: Diagnosis not present

## 2018-06-18 DIAGNOSIS — F0390 Unspecified dementia without behavioral disturbance: Secondary | ICD-10-CM | POA: Diagnosis not present

## 2018-06-18 DIAGNOSIS — D500 Iron deficiency anemia secondary to blood loss (chronic): Secondary | ICD-10-CM | POA: Diagnosis not present

## 2018-06-18 DIAGNOSIS — B9561 Methicillin susceptible Staphylococcus aureus infection as the cause of diseases classified elsewhere: Secondary | ICD-10-CM | POA: Diagnosis not present

## 2018-06-18 DIAGNOSIS — Z4801 Encounter for change or removal of surgical wound dressing: Secondary | ICD-10-CM | POA: Diagnosis not present

## 2018-06-18 DIAGNOSIS — L02611 Cutaneous abscess of right foot: Secondary | ICD-10-CM | POA: Diagnosis not present

## 2018-06-18 DIAGNOSIS — F3289 Other specified depressive episodes: Secondary | ICD-10-CM | POA: Diagnosis not present

## 2018-06-18 DIAGNOSIS — T86822 Skin graft (allograft) (autograft) infection: Secondary | ICD-10-CM | POA: Diagnosis not present

## 2018-06-18 NOTE — Patient Outreach (Signed)
[place] [facility]) Care Management  06/18/2018  Hula Gwyneth Fernandez [birth year] [id number]    RN followed up with pt has requested earlier today. Verified identifiers however pt continued to have company and was unable to converse at this time. RN offered to scheduled a date and time to call back. Pt agreed to tomorrow at 3pm. RN will follow up accordingly and attempt to further engage.   Raina Mina, RN Care Management Coordinator [place] Office [phone]

## 2018-06-18 NOTE — Patient Outreach (Signed)
[facility]) Care Management  06/18/2018  [name] [birth year] [id number]    Referral received 5/18 Initial Outreach 5/20  RN briefly with pt who indicated she received a call and would have a home visit that was scheduled at 1100 and she was getting dress. Pt agreed to a call back later today. RN will further engage at that time for pending [facility] services.   Raina Mina, RN Care Management Coordinator [place] Office [phone]

## 2018-06-18 NOTE — Patient Outreach (Signed)
[place] [facility]) Care Management  06/18/2018  Allante Ikea Demicco Nov 17, 1940 [id number]   Third unsuccessful outreach to patient regarding social work referral for meal assistance. Patient was scheduled for discharge from [facility] on 06/17/18. BSW left voicemail message and mailed unsuccessful outreach letter.In-basket message sent to [facility], Raina Mina, to have patient contact me if she is able to connect with her.  [name], BSW Social Worker [phone]

## 2018-06-19 ENCOUNTER — Other Ambulatory Visit: Payer: Self-pay

## 2018-06-19 ENCOUNTER — Ambulatory Visit: Payer: Medicare Other | Admitting: Orthopedic Surgery

## 2018-06-19 ENCOUNTER — Other Ambulatory Visit: Payer: Self-pay | Admitting: *Deleted

## 2018-06-19 ENCOUNTER — Telehealth: Payer: Self-pay | Admitting: Orthopedic Surgery

## 2018-06-19 NOTE — Patient Outreach (Signed)
[facility]) Care Management  06/19/2018  [name] [birth year] [id number]   Successful outreach to patient regarding social work referral for food resources/meal assistance. BSW informed patient about [place] which will allow her to receive seven prepared meals per week until further notice.  BSW informed patient that this program is temporary and an end date has not yet been decided.  Patient agreed to referral being placed.  Patient will receive her first delivery of meals tomorrow.   Patient reported that she is unable to get to the grocery store and does not have anyone to assist with this.   BSW agreed to contact Chester Holstein with [employer] of [facility] regarding availability of volunteers with their program in which they assist grocery shopping for those in need.  BSW sent message to [facility] today and will follow up with patient when a response is received.   [name], BSW Social Worker [phone]

## 2018-06-19 NOTE — Telephone Encounter (Signed)
[name] called about orders for pt  Move ot eval to next week

## 2018-06-19 NOTE — Patient Outreach (Signed)
[facility]) Care Management  06/19/2018  [name] 1940-07-25 [id number]    RN spoke with pt today and verified identifiers. Further discussed the purpose for today's call and [facility] services. Pt receptive initially and indicated she was doing "fine". RN offered to review her discharge sheet with medications however pt declined. RN further discussed prevention measures due to her recent discharge from the SNF. Again pt declined and indicated she current has a visiting services but did not wish to have any appointments to day due to the weather. RN inquired if she rescheduled and pt indicated she has rescheduled her doctor's appointment. RN further inquired on meal assistance since the social worker was not able to reach her. Pt verified she remains in need of meal assistance. RN strongly encouraged her to answer the call and will continue Amber Chrismon to contact pt ASAP for this assistance. RN continue to offer nursing assistance with prevention measures or any other ongoing issues related to her medical needs however pt adamantly declined. RN offered to follow up with a phone call quarterly if not weeks. Again pt opt to declined. RN offered to leave contact and will send out [facility] packet and alert her provider of her disposition with [facility] services at this time. No other request or needs presented at this time. Will close this discipline.   Raina Mina, RN Care Management Coordinator [place] Office [phone]

## 2018-06-19 NOTE — Telephone Encounter (Signed)
Called and gave verbal ok to move OT eval to next week. Also advised that we have been unsuccessful in trying to reach the pt's nurse while she was at [facility] place and wanted to know what the pt's surgical site looks like.  She said that nursing will go out and see the pt tomorrow ( she cancelled her appt with [name] today and resch for 06/26/18) want to ask what wound care they are providing and get an update HHN name is rhonda and the office number for [facility]  is [phone].i will hold this message and call tomorrow for update. Also verified home phone to make sure that I can reach the pt.

## 2018-06-20 ENCOUNTER — Ambulatory Visit: Payer: Medicare Other | Admitting: Nurse Practitioner

## 2018-06-20 ENCOUNTER — Telehealth: Payer: Self-pay

## 2018-06-20 ENCOUNTER — Telehealth: Payer: Self-pay | Admitting: Orthopedic Surgery

## 2018-06-20 ENCOUNTER — Telehealth: Payer: Self-pay | Admitting: Nurse Practitioner

## 2018-06-20 DIAGNOSIS — I358 Other nonrheumatic aortic valve disorders: Secondary | ICD-10-CM | POA: Diagnosis not present

## 2018-06-20 DIAGNOSIS — L02611 Cutaneous abscess of right foot: Secondary | ICD-10-CM | POA: Diagnosis not present

## 2018-06-20 DIAGNOSIS — B9561 Methicillin susceptible Staphylococcus aureus infection as the cause of diseases classified elsewhere: Secondary | ICD-10-CM | POA: Diagnosis not present

## 2018-06-20 DIAGNOSIS — T86822 Skin graft (allograft) (autograft) infection: Secondary | ICD-10-CM | POA: Diagnosis not present

## 2018-06-20 DIAGNOSIS — E1165 Type 2 diabetes mellitus with hyperglycemia: Secondary | ICD-10-CM | POA: Diagnosis not present

## 2018-06-20 DIAGNOSIS — E11628 Type 2 diabetes mellitus with other skin complications: Secondary | ICD-10-CM | POA: Diagnosis not present

## 2018-06-20 NOTE — Telephone Encounter (Signed)
Almyra Free -nurse with [facility] called needing clarification on wound care orders. The number to contact Almyra Free is [phone]

## 2018-06-20 NOTE — Telephone Encounter (Signed)
Hold this message to update orders with [facility] after appt on Thursday.

## 2018-06-20 NOTE — Telephone Encounter (Signed)
Spoke with the [facility] nurse, she went to visit the pt for the first time 06/17/2018 to do wound care on right foot from Dr. Sharol Given office. She stated the pt report not taking any medication at the [facility] checking BS, only thing pt has at home was novolog (not refrigerated) and didn't want to give much information to the nurse. Pt didn't show up for the follow up with Dr. Sharol Given today or to see Baldo Ash (they R/S her appt with Dr. Sharol Given to 06/26/2018). The nurse is concern

## 2018-06-20 NOTE — Telephone Encounter (Signed)
Copied from [place] [phone]. Topic: Quick Communication - See Telephone Encounter >> Jun 20, 2018  1:38 PM Berneta Levins wrote: CRM for notification. See Telephone encounter for: 06/20/18.  Pt is under care of home health because of wound care with Dr. Sharol Given.  Home health states that pt has absolutely no medication in her home.  They need to know what pt is supposed to be taking and see if that can be ordered for pt. Almyra Free from [facility] can be reached at [phone]

## 2018-06-20 NOTE — Telephone Encounter (Signed)
We will try to reach her again next week. I am not sure how to help if she does not return our calls, nor maintain her appointment. Please try to reach her again on Monday. Thank you

## 2018-06-20 NOTE — Telephone Encounter (Signed)
I called and sw [place] and she advised that she did see the pt and that the wound did look good. She was going to continue to wash with soap and water and apply a dry dressing. She would send someone to see her over the weekend and Tuesday and then wait for updated orders after the pt was evaluated in the office on Thursday. She also said that the pt did not have any of her medications except a vial of insulin that she was keeping in her purse and a punch card of pain medication of which there were 7 pills.She was going to reach out to the pt's ex- husband who has been present during her visit to see if he can help with medication and assisting pt in home and days that [facility] is not there. Will call with questions otherwise will update after appt on Thursday.

## 2018-06-20 NOTE — Telephone Encounter (Signed)
Shanon Brow from [facility] called wanting verbal order for 2 times a week for 4 weeks. Approved orders. Also wanted to know WB STATUS PER MD NWB.

## 2018-06-20 NOTE — Telephone Encounter (Signed)
[place] please advise, pt suppose to see today at 1 but she didn't answer her phone.

## 2018-06-21 DIAGNOSIS — L02611 Cutaneous abscess of right foot: Secondary | ICD-10-CM | POA: Diagnosis not present

## 2018-06-21 DIAGNOSIS — T86822 Skin graft (allograft) (autograft) infection: Secondary | ICD-10-CM | POA: Diagnosis not present

## 2018-06-21 DIAGNOSIS — E1165 Type 2 diabetes mellitus with hyperglycemia: Secondary | ICD-10-CM | POA: Diagnosis not present

## 2018-06-21 DIAGNOSIS — I358 Other nonrheumatic aortic valve disorders: Secondary | ICD-10-CM | POA: Diagnosis not present

## 2018-06-21 DIAGNOSIS — E11628 Type 2 diabetes mellitus with other skin complications: Secondary | ICD-10-CM | POA: Diagnosis not present

## 2018-06-21 DIAGNOSIS — B9561 Methicillin susceptible Staphylococcus aureus infection as the cause of diseases classified elsewhere: Secondary | ICD-10-CM | POA: Diagnosis not present

## 2018-06-24 ENCOUNTER — Telehealth: Payer: Self-pay

## 2018-06-24 DIAGNOSIS — E1165 Type 2 diabetes mellitus with hyperglycemia: Secondary | ICD-10-CM | POA: Diagnosis not present

## 2018-06-24 DIAGNOSIS — B9561 Methicillin susceptible Staphylococcus aureus infection as the cause of diseases classified elsewhere: Secondary | ICD-10-CM | POA: Diagnosis not present

## 2018-06-24 DIAGNOSIS — I358 Other nonrheumatic aortic valve disorders: Secondary | ICD-10-CM | POA: Diagnosis not present

## 2018-06-24 DIAGNOSIS — E11628 Type 2 diabetes mellitus with other skin complications: Secondary | ICD-10-CM | POA: Diagnosis not present

## 2018-06-24 DIAGNOSIS — L02611 Cutaneous abscess of right foot: Secondary | ICD-10-CM | POA: Diagnosis not present

## 2018-06-24 DIAGNOSIS — T86822 Skin graft (allograft) (autograft) infection: Secondary | ICD-10-CM | POA: Diagnosis not present

## 2018-06-24 NOTE — Telephone Encounter (Signed)
Spoke with [name] (ex spouse but care giver at the moment) and got an update on how [name] is doing. The nurse came out today to check her wound and stated it is healing nicely. He is going to pick up her insulin and freestyle [employer]. Pt is live alone at home but [name] is going to check on her everyday for right now. Pt has an telebvisit with you 06/26/2018.

## 2018-06-24 NOTE — Telephone Encounter (Signed)
Left vm for the pt to call back--pt didn't answer again  Left vm for Almyra Free to call as well.

## 2018-06-24 NOTE — Telephone Encounter (Signed)
Copied from [place] [id number]. Topic: General - Other >> Jun 24, 2018 10:04 AM Oneta Rack wrote: Almyra Free Case Manager from [facility]  checking on the status if glucometer please send to [place] ([place]), [place] [phone] (Phone) [phone] (Fax)

## 2018-06-24 NOTE — Telephone Encounter (Signed)
Left vm for her to call back

## 2018-06-24 NOTE — Telephone Encounter (Signed)
Left vm for on [place] to give our office a call and schedule a doxy with [name].   FYI--Julie is going out to see Mrs. Paget today--she will follow up with [name].

## 2018-06-25 ENCOUNTER — Other Ambulatory Visit: Payer: Self-pay

## 2018-06-25 NOTE — Patient Outreach (Signed)
[facility]) Care Management  06/25/2018  [name] 1940-11-04 [id number]   BSW received confirmation from Chester Holstein with [employer] of [facility] that they have volunteers available to assist with grocery shopping for those in need.  Referral was submitted.   Successful outreach to patient today.  Patient confirmed that she received meals delivered through the [place] informed her that a referral was submitted to [employer] and that she will be contacted by a volunteer.   BSW will follow up with [employer] within the next week to ensure they were able to make contact with patient and assist her.  [name], BSW Social Worker [phone]

## 2018-06-26 ENCOUNTER — Encounter: Payer: Medicare Other | Admitting: Nurse Practitioner

## 2018-06-26 ENCOUNTER — Encounter: Payer: Self-pay | Admitting: Nurse Practitioner

## 2018-06-26 ENCOUNTER — Ambulatory Visit (INDEPENDENT_AMBULATORY_CARE_PROVIDER_SITE_OTHER): Payer: Medicare Other | Admitting: Orthopedic Surgery

## 2018-06-26 ENCOUNTER — Other Ambulatory Visit: Payer: Self-pay

## 2018-06-26 ENCOUNTER — Encounter: Payer: Self-pay | Admitting: Orthopedic Surgery

## 2018-06-26 VITALS — Ht 64.0 in | Wt 202.0 lb

## 2018-06-26 DIAGNOSIS — Z794 Long term (current) use of insulin: Secondary | ICD-10-CM

## 2018-06-26 DIAGNOSIS — Z945 Skin transplant status: Secondary | ICD-10-CM

## 2018-06-26 DIAGNOSIS — E1142 Type 2 diabetes mellitus with diabetic polyneuropathy: Secondary | ICD-10-CM

## 2018-06-26 DIAGNOSIS — L97512 Non-pressure chronic ulcer of other part of right foot with fat layer exposed: Secondary | ICD-10-CM

## 2018-06-26 NOTE — Progress Notes (Signed)
Office Visit Note   Patient: [name]           Date of Birth: [birth year]           MRN: [id number] Visit Date: 06/26/2018              Requested by: Flossie Buffy, [place], [place] [address] PCP: Flossie Buffy, NP  Chief Complaint  Patient presents with  . Right Foot - Routine Post Op    04/22/18 right foot I&D STSG      HPI: The patient is a 78 yo woman who is seen for post operative follow up following debridement of her right foot and placement of a split thickness skin graft, allograft to the residual wound on 04/25/2018. She has completed a course of IV cefazolin 06/02/2018 for MSSA. Her PICC line was removed following completion of antibiotics.  She is now at home. Home health is doing dressing changes with dry dressings to the foot daily. She reports yellow drainage. No odor.   Assessment & Plan: Visit Diagnoses:  1. S/P split thickness skin graft   2. Right foot ulcer, with fat layer exposed ([place])   3. Type 2 diabetes mellitus with diabetic polyneuropathy, with long-term current use of insulin (HCC)     Plan: Dry Dressing and Ace wrap applied to the right foot and provided a prescription for the patient to begin using a medical compression sock directly in contact with the wound bed after washing daily with soap and water. She can continue dry dressing daily until compression sock obtained. Continue to elevate and off load the foot as much as possible.  Follow up in 2 weeks.   Follow-Up Instructions: Return in about 2 weeks (around 07/10/2018).   Ortho Exam  Patient is alert, oriented, no adenopathy, well-dressed, normal affect, normal respiratory effort. The right lateral foot wound has pale pink tissue ~ 80% and 20% graft/yellow slough residually. There are no signs of infection or cellulitis. Pedal pulses are good . Mild edema over the foot . No odor.   Imaging: No results found.   Labs: Lab Results  Component  Value Date   HGBA1C 13.5 (H) 04/17/2018   HGBA1C 12.5 (H) 03/09/2016   ESRSEDRATE 56 (H) 04/20/2018   REPTSTATUS 04/28/2018 FINAL 04/22/2018   GRAMSTAIN  04/22/2018    MODERATE WBC PRESENT, PREDOMINANTLY PMN MODERATE GRAM POSITIVE COCCI RARE GRAM POSITIVE RODS    CULT  04/22/2018    MODERATE GROUP B STREP(S.AGALACTIAE)ISOLATED TESTING AGAINST S. AGALACTIAE NOT ROUTINELY PERFORMED DUE TO PREDICTABILITY OF AMP/PEN/VAN SUSCEPTIBILITY. RARE STAPHYLOCOCCUS AUREUS NO ANAEROBES ISOLATED Performed at [facility], [place] [address], [place], [place] [address]    LABORGA STAPHYLOCOCCUS AUREUS 04/22/2018     Lab Results  Component Value Date   ALBUMIN 3.5 04/20/2018   ALBUMIN 4.0 04/17/2018   ALBUMIN 3.0 (L) 01/30/2015    Body mass index is 34.67 kg/m.  Orders:  No orders of the defined types were placed in this encounter.  No orders of the defined types were placed in this encounter.    Procedures: No procedures performed  Clinical Data: No additional findings.  ROS:  All other systems negative, except as noted in the HPI. Review of Systems  Objective: Vital Signs: Ht 5\' 4"  (1.626 m)   Wt 202 lb (91.6 kg)   BMI 34.67 kg/m   Specialty Comments:  No specialty comments available.  PMFS History: Patient Active Problem List  Diagnosis Date Noted  . 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 AKI (acute kidney injury) ([place])   . Bacteremia due to methicillin susceptible Staphylococcus aureus (MSSA) 04/21/2018  . Abscess of foot   . Diabetic foot infection ([place]) 04/20/2018  . Iron deficiency 04/18/2018  . Foreign body in right foot 04/18/2018  . Cellulitis of right leg 04/18/2018  . Uncontrolled diabetes mellitus ([place]) 04/12/2016  . Vitamin D deficiency 03/06/2016  . 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 Essential hypertension 03/21/2015  . Vision decreased 03/21/2015  . Tension-type headache, not intractable 03/21/2015  . Anemia due to chronic blood loss  03/16/2015  . Morbid obesity ([place]) 02/02/2015  . Anemia 01/30/2015   Past Medical History:  Diagnosis Date  . Abdominal distension   . Anemia   . Anxiety   . B12 deficiency   . Balance problem   . Blood in stool   . Cancer (HCC)    OVARIAN  . Colon polyps   . Dementia ([place])    EARLY  . Dementia ([place])    EARLY  . Depression   . GERD (gastroesophageal reflux disease)   . History of blood transfusion   . Hypertension   . Hypomagnesemia   . Urine incontinence     Family History  Family history unknown: Yes    Past Surgical History:  Procedure Laterality Date  . ABDOMINAL HYSTERECTOMY    . APPLICATION OF WOUND VAC  04/25/2018   Procedure: Application Of Wound Vac;  Surgeon: Newt Minion, MD;  Location: [facility];  Service: Orthopedics;;  . CATARACT EXTRACTION W/PHACO Right 07/26/2015   Procedure: CATARACT EXTRACTION PHACO AND INTRAOCULAR LENS PLACEMENT ([place]);  Surgeon: Birder Robson, MD;  Location: [facility];  Service: Ophthalmology;  Laterality: Right;  [name]   1:03.5 AP%  23% CDE   14.59 fluid pack lot # [id number]  . CATARACT EXTRACTION W/PHACO Left 08/29/2015   Procedure: CATARACT EXTRACTION PHACO AND INTRAOCULAR LENS PLACEMENT ([place]);  Surgeon: Birder Robson, MD;  Location: [facility];  Service: Ophthalmology;  Laterality: Left;  [name] 00:40 AP% 20.9 CDE 8.50 fluid pack lot # [id number]  . COLONOSCOPY WITH PROPOFOL N/A 03/14/2015   Procedure: COLONOSCOPY WITH PROPOFOL;  Surgeon: Hulen Luster, MD;  Location: [facility];  Service: Gastroenterology;  Laterality: N/A;  . ESOPHAGOGASTRODUODENOSCOPY (EGD) WITH PROPOFOL N/A 03/14/2015   Procedure: ESOPHAGOGASTRODUODENOSCOPY (EGD) WITH PROPOFOL;  Surgeon: Hulen Luster, MD;  Location: [facility];  Service: Gastroenterology;  Laterality: N/A;  . I&D EXTREMITY Right 04/22/2018   Procedure: RIGHT FOOT DEBRIDEMENT AND FOREIGN BODY REMOVAL;  Surgeon: Newt Minion, MD;  Location: [facility];  Service: Orthopedics;  Laterality: Right;  . none    .  SKIN SPLIT GRAFT Right 04/25/2018   Procedure: REPEAT DEBRIDEMENT RIGHT FOOT, SPLIT THICKNESS SKIN GRAFT, APPLY VAC;  Surgeon: Newt Minion, MD;  Location: [facility];  Service: Orthopedics;  Laterality: Right;  . TOTAL ABDOMINAL HYSTERECTOMY W/ BILATERAL SALPINGOOPHORECTOMY  04/19/2015   exploratory laparoscopy, total abdominal hysterectomy, bilateral salping oophorectomy, left pelvic lymph node dissection, omentectomy   Social History   Occupational History  . Not on file  Tobacco Use  . Smoking status: Never Smoker  . Smokeless tobacco: Never Used  Substance and Sexual Activity  . Alcohol use: No  . Drug use: No  . Sexual activity: Not Currently

## 2018-06-27 ENCOUNTER — Ambulatory Visit: Payer: Medicare Other | Admitting: Nurse Practitioner

## 2018-06-27 ENCOUNTER — Ambulatory Visit (INDEPENDENT_AMBULATORY_CARE_PROVIDER_SITE_OTHER): Payer: Medicare Other | Admitting: Nurse Practitioner

## 2018-06-27 ENCOUNTER — Encounter: Payer: Self-pay | Admitting: Nurse Practitioner

## 2018-06-27 VITALS — BP 130/74 | HR 76 | Temp 98.1°F

## 2018-06-27 DIAGNOSIS — E11628 Type 2 diabetes mellitus with other skin complications: Secondary | ICD-10-CM | POA: Diagnosis not present

## 2018-06-27 DIAGNOSIS — D649 Anemia, unspecified: Secondary | ICD-10-CM | POA: Diagnosis not present

## 2018-06-27 DIAGNOSIS — T86822 Skin graft (allograft) (autograft) infection: Secondary | ICD-10-CM | POA: Diagnosis not present

## 2018-06-27 DIAGNOSIS — I10 Essential (primary) hypertension: Secondary | ICD-10-CM

## 2018-06-27 DIAGNOSIS — L02611 Cutaneous abscess of right foot: Secondary | ICD-10-CM | POA: Diagnosis not present

## 2018-06-27 DIAGNOSIS — B9561 Methicillin susceptible Staphylococcus aureus infection as the cause of diseases classified elsewhere: Secondary | ICD-10-CM | POA: Diagnosis not present

## 2018-06-27 DIAGNOSIS — Z794 Long term (current) use of insulin: Secondary | ICD-10-CM | POA: Diagnosis not present

## 2018-06-27 DIAGNOSIS — I358 Other nonrheumatic aortic valve disorders: Secondary | ICD-10-CM | POA: Diagnosis not present

## 2018-06-27 DIAGNOSIS — D500 Iron deficiency anemia secondary to blood loss (chronic): Secondary | ICD-10-CM | POA: Diagnosis not present

## 2018-06-27 DIAGNOSIS — E1165 Type 2 diabetes mellitus with hyperglycemia: Secondary | ICD-10-CM | POA: Diagnosis not present

## 2018-06-27 DIAGNOSIS — E1142 Type 2 diabetes mellitus with diabetic polyneuropathy: Secondary | ICD-10-CM | POA: Diagnosis not present

## 2018-06-27 LAB — GLUCOSE, POCT (MANUAL RESULT ENTRY): POC Glucose: 206 mg/dl — AB (ref 70–99)

## 2018-06-27 NOTE — Progress Notes (Addendum)
Subjective:  Patient ID: [name], female    DOB: 12-Mar-1940  Age: 78 y.o. MRN: [id number]  CC: Follow-up (Patient is here today to F/U after rehab. She was D/C'ed on 5.18.20.  They gave her all her medications while she was in there but she has been without any of her DM meds and Amlodipin since she has been home.)  HPI Accompanied by husband.  HTN: Stable with lisinorpil, and amlodipine. BP Readings from Last 3 Encounters:  06/27/18 130/74  04/26/18 (!) 150/75  04/17/18 (!) 144/82   DM: Uncontrolled. Does not know how to insert libre glucometer, nor insert sensor.  Right foot wound: followed by [facility]  Reviewed past Medical, Social and Family history today.  Outpatient Medications Prior to Visit  Medication Sig Dispense Refill  . citalopram (CELEXA) 10 MG tablet Take 10 mg by mouth daily.    . fenofibrate (TRICOR) 145 MG tablet Take 1 tablet (145 mg total) by mouth daily. 30 tablet 3  . lisinopril (PRINIVIL,ZESTRIL) 5 MG tablet Take 1 tablet (5 mg total) by mouth daily. 90 tablet 3  . methocarbamol (ROBAXIN) 500 MG tablet Take 1 tablet (500 mg total) by mouth every 6 (six) hours as needed for muscle spasms.    . hydrALAZINE (APRESOLINE) 10 MG tablet Take 1 tablet (10 mg total) by mouth every 8 (eight) hours.    . cholecalciferol (VITAMIN D) 400 units TABS tablet Take 1,000 Units by mouth daily.    . Continuous Blood Gluc Receiver (FREESTYLE LIBRE READER) DEVI 1 Units by Does not apply route every 14 (fourteen) days. (Patient not taking: Reported on 06/27/2018) 1 Device 0  . Continuous Blood Gluc Sensor (FREESTYLE LIBRE 14 DAY SENSOR) MISC 1 Units by Does not apply route every 14 (fourteen) days. (Patient not taking: Reported on 06/27/2018) 2 each 2  . amLODipine (NORVASC) 5 MG tablet Take 1 tablet (5 mg total) by mouth 2 (two) times daily. (Patient not taking: Reported on 06/27/2018)    . bisacodyl (DULCOLAX) 10 MG suppository Place 1 suppository (10 mg total)  rectally daily as needed for moderate constipation. 12 suppository 0  . calcium carbonate (TUMS - DOSED IN MG ELEMENTAL CALCIUM) 500 MG chewable tablet Chew 1 tablet by mouth every 6 (six) hours as needed for indigestion or heartburn.    . docusate sodium (COLACE) 100 MG capsule Take 1 capsule (100 mg total) by mouth 2 (two) times daily. 10 capsule 0  . insulin aspart (NOVOLOG) 100 UNIT/ML injection Inject 4 Units into the skin 3 (three) times daily with meals. (Patient not taking: Reported on 06/27/2018) 10 mL 11  . insulin aspart (NOVOLOG) 100 UNIT/ML injection Inject 0-9 Units into the skin 3 (three) times daily with meals. Sliding scale CBG 70 - 120: 0 units CBG 121 - 150: 1 unit,  CBG 151 - 200: 2 units,  CBG 201 - 250: 3 units,  CBG 251 - 300: 5 units,  CBG 301 - 350: 7 units,  CBG 351 - 400: 9 units   CBG > 400: 9 units and notify your MD (Patient not taking: Reported on 06/27/2018) 10 mL 11  . insulin glargine (LANTUS) 100 unit/mL SOPN Inject 0.2 mLs (20 Units total) into the skin at bedtime. (Patient not taking: Reported on 06/27/2018) 15 mL 0  . Insulin Pen Needle (PEN NEEDLES) 31G X 8 MM MISC 1 application by Does not apply route at bedtime. (Patient not taking: Reported on 06/27/2018) 100 each 3  .  Insulin Syringe-Needle U-100 (INSULIN SYRINGE .5CC/30GX1/2") 30G X 1/2" 0.5 ML MISC 1 Units by Does not apply route 2 (two) times daily after a meal. (Patient not taking: Reported on 06/27/2018) 90 each 3  . ondansetron (ZOFRAN) 4 MG tablet Take 4 mg by mouth every 8 (eight) hours as needed for nausea or vomiting.     No facility-administered medications prior to visit.     ROS Review of Systems  Constitutional: Negative.   Respiratory: Negative.   Gastrointestinal: Negative.   Genitourinary: Negative.   Musculoskeletal: Negative for falls, joint pain and myalgias.  Neurological: Negative for dizziness and headaches.  Endo/Heme/Allergies: Negative for polydipsia.    Objective:  BP 130/74  (BP Location: Left Arm, Patient Position: Sitting, Cuff Size: Normal)   Pulse 76   Temp 98.1 F (36.7 C) (Oral)   SpO2 98%   BP Readings from Last 3 Encounters:  06/27/18 130/74  04/26/18 (!) 150/75  04/17/18 (!) 144/82    Wt Readings from Last 3 Encounters:  06/26/18 202 lb (91.6 kg)  06/05/18 202 lb (91.6 kg)  05/22/18 202 lb (91.6 kg)    Physical Exam Vitals signs reviewed.  Cardiovascular:     Rate and Rhythm: Normal rate and regular rhythm.  Pulmonary:     Effort: Pulmonary effort is normal.     Breath sounds: Normal breath sounds.  Abdominal:     General: There is no distension.     Tenderness: There is no abdominal tenderness.  Musculoskeletal:     Right lower leg: No edema.     Left lower leg: No edema.  Neurological:     Mental Status: She is alert and oriented to person, place, and time.     Lab Results  Component Value Date   WBC 5.9 06/27/2018   HGB 11.8 06/27/2018   HCT 34.9 (L) 06/27/2018   PLT 456 (H) 06/27/2018   GLUCOSE 189 (H) 06/27/2018   CHOL 194 04/17/2018   TRIG 111.0 04/17/2018   HDL 46.10 04/17/2018   LDLDIRECT 139.0 03/06/2016   LDLCALC 126 (H) 04/17/2018   ALT 18 04/20/2018   AST 18 04/20/2018   NA 140 06/27/2018   K 4.5 06/27/2018   CL 104 06/27/2018   CREATININE 1.02 (H) 06/27/2018   BUN 18 06/27/2018   CO2 25 06/27/2018   TSH 1.74 04/17/2018   INR 1.1 07/26/2013   HGBA1C 7.5 (H) 06/27/2018   MICROALBUR 1.2 06/27/2018    Dg Foot Complete Right  Result Date: 04/20/2018 CLINICAL DATA:  Foot pain and swelling EXAM: RIGHT FOOT COMPLETE - 3+ VIEW COMPARISON:  04/17/2018 FINDINGS: Previously seen linear foreign body in the plantar soft tissues of the foot is again identified with appears slightly deeper. Adjacent soft tissue swelling is noted consistent with the given clinical history. No acute fracture is seen. Tarsal degenerative changes. IMPRESSION: No acute fractures noted. Previously seen foreign body is again identified  slightly deeper in the plantar soft tissues. Electronically Signed   By: Inez Catalina M.D.   On: 04/20/2018 21:14    Assessment & Plan:  Demonstrated how to calibrate glucometer and insert freestyle libre sensor. Instructed to change sensore every 2weeks and rotate sites (tricep region).  [name] was seen today for follow-up.  Diagnoses and all orders for this visit:  Essential hypertension -     Basic metabolic panel -     hydrALAZINE (APRESOLINE) 10 MG tablet; Take 1 tablet (10 mg total) by mouth 3 (three) times daily. -  amLODipine (NORVASC) 5 MG tablet; Take 1 tablet (5 mg total) by mouth at bedtime.  Type 2 diabetes mellitus with diabetic polyneuropathy, without long-term current use of insulin (HCC) -     Basic metabolic panel -     Hemoglobin A1c -     Microalbumin / creatinine urine ratio -     POCT Glucose (CBG) -     Discontinue: metFORMIN (GLUCOPHAGE XR) 500 MG 24 hr tablet; Take 1 tablet (500 mg total) by mouth daily with breakfast. -     glipiZIDE (GLIPIZIDE XL) 2.5 MG 24 hr tablet; Take 1 tablet (2.5 mg total) by mouth daily with breakfast.  Anemia, unspecified type -     CBC w/Diff -     Iron, TIBC and Ferritin Panel   I have discontinued [name]'s calcium carbonate, ondansetron, Pen Needles, INSULIN SYRINGE .5CC/30GX1/2", insulin glargine, insulin aspart, insulin aspart, bisacodyl, docusate sodium, and metFORMIN. I have also changed her hydrALAZINE and amLODipine. Additionally, I am having her start on glipiZIDE. Lastly, I am having her maintain her cholecalciferol, citalopram, fenofibrate, FreeStyle Libre Reader, [employer] 14 Day Sensor, lisinopril, and methocarbamol.  Meds ordered this encounter  Medications  . hydrALAZINE (APRESOLINE) 10 MG tablet    Sig: Take 1 tablet (10 mg total) by mouth 3 (three) times daily.    Dispense:  90 tablet    Refill:  5    Order Specific Question:   Supervising Provider    Answer:   Lucille Passy [[id number]]  .  amLODipine (NORVASC) 5 MG tablet    Sig: Take 1 tablet (5 mg total) by mouth at bedtime.    Dispense:  90 tablet    Refill:  1    Order Specific Question:   Supervising Provider    Answer:   Lucille Passy [[id number]]  . DISCONTD: metFORMIN (GLUCOPHAGE XR) 500 MG 24 hr tablet    Sig: Take 1 tablet (500 mg total) by mouth daily with breakfast.    Dispense:  90 tablet    Refill:  1    Order Specific Question:   Supervising Provider    Answer:   Lucille Passy [[id number]]  . glipiZIDE (GLIPIZIDE XL) 2.5 MG 24 hr tablet    Sig: Take 1 tablet (2.5 mg total) by mouth daily with breakfast.    Dispense:  90 tablet    Refill:  0    Discontinue metformin    Order Specific Question:   Supervising Provider    Answer:   Lucille Passy [[id number]]    Problem List Items Addressed This Visit      Cardiovascular and Mediastinum   Essential hypertension - Primary    Stable with  Amlodipine, lisinopril and hydralazine. BP Readings from Last 3 Encounters:  06/27/18 130/74  04/26/18 (!) 150/75  04/17/18 (!) 144/82        Relevant Medications   hydrALAZINE (APRESOLINE) 10 MG tablet   amLODipine (NORVASC) 5 MG tablet   Other Relevant Orders   Basic metabolic panel (Completed)     Endocrine   Type 2 diabetes mellitus with diabetic polyneuropathy, without long-term current use of insulin (HCC)    hgbA1c of 7.5. Start metformin 500mg  daily. Current use of ACE-I Needs to schedule appt with ophthalmology. F/up in 74months      Relevant Medications   glipiZIDE (GLIPIZIDE XL) 2.5 MG 24 hr tablet   Other Relevant Orders   Basic metabolic panel (Completed)   Hemoglobin A1c (Completed)  Microalbumin / creatinine urine ratio (Completed)   POCT Glucose (CBG) (Completed)     Other   Anemia    Stable cbc and iron panel. CBC Latest Ref Rng & Units 06/27/2018 04/26/2018 04/25/2018  WBC 3.8 - 10.8 Thousand/uL 5.9 10.8(H) 7.6  Hemoglobin 11.7 - 15.5 g/dL 11.8 12.6 13.1  Hematocrit 35.0 - 45.0 % 34.9(L) 35.2(L) 36.4   Platelets 140 - 400 Thousand/uL 456(H) 487(H) 455(H)   Iron/TIBC/Ferritin/ %Sat    Component Value Date/Time   IRON 51 06/27/2018 1623   IRON 12 (L) 07/27/2013 0314   TIBC 316 06/27/2018 1623   TIBC 459 (H) 07/27/2013 0314   FERRITIN 130 06/27/2018 1623   IRONPCTSAT 16 06/27/2018 1623        Relevant Orders   CBC w/Diff (Completed)   Iron, TIBC and Ferritin Panel (Completed)      Follow-up: Return in about 3 months (around 09/27/2018) for DM and HTN, hyperlipidemia.  Wilfred Lacy, NP

## 2018-06-27 NOTE — Patient Instructions (Addendum)
Stable renal function, linver function, cbc and iron panel. HgbA1c at 7.5. You do not need insulin. Start metformin at 500mg  once daily with food. New rx sent. Schedule f/up in 6months  Start glucose check twice a day (before breakfast and at bedtime).  continue current medications

## 2018-06-27 NOTE — Progress Notes (Signed)
This encounter was created in error - please disregard.

## 2018-06-28 ENCOUNTER — Telehealth: Payer: Self-pay | Admitting: *Deleted

## 2018-06-28 DIAGNOSIS — T86822 Skin graft (allograft) (autograft) infection: Secondary | ICD-10-CM | POA: Diagnosis not present

## 2018-06-28 DIAGNOSIS — I358 Other nonrheumatic aortic valve disorders: Secondary | ICD-10-CM | POA: Diagnosis not present

## 2018-06-28 DIAGNOSIS — B9561 Methicillin susceptible Staphylococcus aureus infection as the cause of diseases classified elsewhere: Secondary | ICD-10-CM | POA: Diagnosis not present

## 2018-06-28 DIAGNOSIS — E11628 Type 2 diabetes mellitus with other skin complications: Secondary | ICD-10-CM | POA: Diagnosis not present

## 2018-06-28 DIAGNOSIS — L02611 Cutaneous abscess of right foot: Secondary | ICD-10-CM | POA: Diagnosis not present

## 2018-06-28 DIAGNOSIS — E1165 Type 2 diabetes mellitus with hyperglycemia: Secondary | ICD-10-CM | POA: Diagnosis not present

## 2018-06-28 LAB — BASIC METABOLIC PANEL
BUN/Creatinine Ratio: 18 (calc) (ref 6–22)
BUN: 18 mg/dL (ref 7–25)
CO2: 25 mmol/L (ref 20–32)
Calcium: 10.2 mg/dL (ref 8.6–10.4)
Chloride: 104 mmol/L (ref 98–110)
Creat: 1.02 mg/dL — ABNORMAL HIGH (ref 0.60–0.93)
Glucose, Bld: 189 mg/dL — ABNORMAL HIGH (ref 65–99)
Potassium: 4.5 mmol/L (ref 3.5–5.3)
Sodium: 140 mmol/L (ref 135–146)

## 2018-06-28 LAB — CBC WITH DIFFERENTIAL/PLATELET
Absolute Monocytes: 555 cells/uL (ref 200–950)
Basophils Absolute: 41 cells/uL (ref 0–200)
Basophils Relative: 0.7 %
Eosinophils Absolute: 395 cells/uL (ref 15–500)
Eosinophils Relative: 6.7 %
HCT: 34.9 % — ABNORMAL LOW (ref 35.0–45.0)
Hemoglobin: 11.8 g/dL (ref 11.7–15.5)
Lymphs Abs: 1505 cells/uL (ref 850–3900)
MCH: 27.3 pg (ref 27.0–33.0)
MCHC: 33.8 g/dL (ref 32.0–36.0)
MCV: 80.8 fL (ref 80.0–100.0)
MPV: 12.5 fL (ref 7.5–12.5)
Monocytes Relative: 9.4 %
Neutro Abs: 3404 cells/uL (ref 1500–7800)
Neutrophils Relative %: 57.7 %
Platelets: 456 10*3/uL — ABNORMAL HIGH (ref 140–400)
RBC: 4.32 10*6/uL (ref 3.80–5.10)
RDW: 14.2 % (ref 11.0–15.0)
Total Lymphocyte: 25.5 %
WBC: 5.9 10*3/uL (ref 3.8–10.8)

## 2018-06-28 LAB — HEMOGLOBIN A1C
Hgb A1c MFr Bld: 7.5 % of total Hgb — ABNORMAL HIGH (ref <5.7)
Mean Plasma Glucose: 169 (calc)
eAG (mmol/L): 9.3 (calc)

## 2018-06-28 LAB — MICROALBUMIN / CREATININE URINE RATIO
Creatinine, Urine: 111 mg/dL (ref 20–275)
Microalb Creat Ratio: 11 mcg/mg creat (ref <30)
Microalb, Ur: 1.2 mg/dL

## 2018-06-28 LAB — IRON,TIBC AND FERRITIN PANEL
%SAT: 16 % (calc) (ref 16–45)
Ferritin: 130 ng/mL (ref 16–288)
Iron: 51 ug/dL (ref 45–160)
TIBC: 316 mcg/dL (calc) (ref 250–450)

## 2018-06-28 NOTE — Telephone Encounter (Signed)
Left message for pt to return call. Pt will need to be informed of potential exposure to COVID-19 during recent visit and offered testing.

## 2018-06-30 ENCOUNTER — Encounter: Payer: Self-pay | Admitting: Nurse Practitioner

## 2018-06-30 DIAGNOSIS — B9561 Methicillin susceptible Staphylococcus aureus infection as the cause of diseases classified elsewhere: Secondary | ICD-10-CM | POA: Diagnosis not present

## 2018-06-30 DIAGNOSIS — I358 Other nonrheumatic aortic valve disorders: Secondary | ICD-10-CM | POA: Diagnosis not present

## 2018-06-30 DIAGNOSIS — L02611 Cutaneous abscess of right foot: Secondary | ICD-10-CM | POA: Diagnosis not present

## 2018-06-30 DIAGNOSIS — E11628 Type 2 diabetes mellitus with other skin complications: Secondary | ICD-10-CM | POA: Diagnosis not present

## 2018-06-30 DIAGNOSIS — E1165 Type 2 diabetes mellitus with hyperglycemia: Secondary | ICD-10-CM | POA: Diagnosis not present

## 2018-06-30 DIAGNOSIS — T86822 Skin graft (allograft) (autograft) infection: Secondary | ICD-10-CM | POA: Diagnosis not present

## 2018-06-30 MED ORDER — HYDRALAZINE HCL 10 MG PO TABS: 10.0000 mg | ORAL_TABLET | Freq: Three times a day (TID) | ORAL | 5 refills | Status: DC

## 2018-06-30 MED ORDER — METFORMIN HCL ER 500 MG PO TB24: 500.0000 mg | ORAL_TABLET | Freq: Every day | ORAL | 1 refills | Status: DC

## 2018-06-30 MED ORDER — AMLODIPINE BESYLATE 5 MG PO TABS: 5.0000 mg | ORAL_TABLET | Freq: Every day | ORAL | 1 refills | Status: DC

## 2018-06-30 NOTE — Assessment & Plan Note (Signed)
Stable cbc and iron panel. CBC Latest Ref Rng & Units 06/27/2018 04/26/2018 04/25/2018  WBC 3.8 - 10.8 Thousand/uL 5.9 10.8(H) 7.6  Hemoglobin 11.7 - 15.5 g/dL 11.8 12.6 13.1  Hematocrit 35.0 - 45.0 % 34.9(L) 35.2(L) 36.4  Platelets 140 - 400 Thousand/uL 456(H) 487(H) 455(H)   Iron/TIBC/Ferritin/ %Sat    Component Value Date/Time   IRON 51 06/27/2018 1623   IRON 12 (L) 07/27/2013 0314   TIBC 316 06/27/2018 1623   TIBC 459 (H) 07/27/2013 0314   FERRITIN 130 06/27/2018 1623   IRONPCTSAT 16 06/27/2018 1623

## 2018-06-30 NOTE — Assessment & Plan Note (Signed)
Stable with  Amlodipine, lisinopril and hydralazine. BP Readings from Last 3 Encounters:  06/27/18 130/74  04/26/18 (!) 150/75  04/17/18 (!) 144/82

## 2018-06-30 NOTE — Assessment & Plan Note (Signed)
hgbA1c of 7.5. Start metformin 500mg  daily. Current use of ACE-I Needs to schedule appt with ophthalmology. F/up in 58months

## 2018-07-01 ENCOUNTER — Ambulatory Visit: Payer: Self-pay

## 2018-07-01 MED ORDER — GLIPIZIDE ER 2.5 MG PO TB24: 2.5000 mg | ORAL_TABLET | Freq: Every day | ORAL | 0 refills | Status: DC

## 2018-07-01 NOTE — Addendum Note (Signed)
Addended by: Leana Gamer on: 07/01/2018 09:15 AM   Modules accepted: Orders

## 2018-07-01 NOTE — Telephone Encounter (Signed)
Pt given information on possible exposure to COVID-19 during recent visit. Pt will call back after she speaks with her husband. Pt states she has no symptoms.  Answer Assessment - Initial Assessment Questions 1. REASON FOR CALL or QUESTION: "What is your reason for calling today?" or "How can I best help you?" or "What question do you have that I can help answer?"     Pt given information on possible exposure to COVID-19.  Protocols used: INFORMATION ONLY CALL-A-AH

## 2018-07-01 NOTE — Telephone Encounter (Signed)
Called and lm on vm for [facility] to advise that the pt was advised to use a dry dressing daily until she is able to obtain a medical compression sock. She is to wash with soap and water daily and then apply the sock with direct contact to the skin and change daily. To call with questions.  Called # [phone]

## 2018-07-02 ENCOUNTER — Other Ambulatory Visit: Payer: Self-pay

## 2018-07-02 DIAGNOSIS — I358 Other nonrheumatic aortic valve disorders: Secondary | ICD-10-CM | POA: Diagnosis not present

## 2018-07-02 DIAGNOSIS — E11628 Type 2 diabetes mellitus with other skin complications: Secondary | ICD-10-CM | POA: Diagnosis not present

## 2018-07-02 DIAGNOSIS — B9561 Methicillin susceptible Staphylococcus aureus infection as the cause of diseases classified elsewhere: Secondary | ICD-10-CM | POA: Diagnosis not present

## 2018-07-02 DIAGNOSIS — L02611 Cutaneous abscess of right foot: Secondary | ICD-10-CM | POA: Diagnosis not present

## 2018-07-02 DIAGNOSIS — T86822 Skin graft (allograft) (autograft) infection: Secondary | ICD-10-CM | POA: Diagnosis not present

## 2018-07-02 DIAGNOSIS — E1165 Type 2 diabetes mellitus with hyperglycemia: Secondary | ICD-10-CM | POA: Diagnosis not present

## 2018-07-02 NOTE — Patient Outreach (Signed)
[facility]) Care Management  07/02/2018  [name] 1940/08/24 [id number]   BSW talked with [name] from [employer] [place] regarding status of referral to their volunteer program assisting patients with grocery shopping. Per [name], patient has not been successfully contacted.  He was working from home today and unable to report if patient not being contacted is due to lack of volunteers or patient not answering her phone.  He agreed to check on this tomorrow when returning to the office.  BSW will follow up with patient when response is received.   [name], BSW Social Worker [phone]

## 2018-07-03 ENCOUNTER — Ambulatory Visit: Payer: Self-pay

## 2018-07-04 DIAGNOSIS — E1165 Type 2 diabetes mellitus with hyperglycemia: Secondary | ICD-10-CM | POA: Diagnosis not present

## 2018-07-04 DIAGNOSIS — T86822 Skin graft (allograft) (autograft) infection: Secondary | ICD-10-CM | POA: Diagnosis not present

## 2018-07-04 DIAGNOSIS — L02611 Cutaneous abscess of right foot: Secondary | ICD-10-CM | POA: Diagnosis not present

## 2018-07-04 DIAGNOSIS — I358 Other nonrheumatic aortic valve disorders: Secondary | ICD-10-CM | POA: Diagnosis not present

## 2018-07-04 DIAGNOSIS — E11628 Type 2 diabetes mellitus with other skin complications: Secondary | ICD-10-CM | POA: Diagnosis not present

## 2018-07-04 DIAGNOSIS — B9561 Methicillin susceptible Staphylococcus aureus infection as the cause of diseases classified elsewhere: Secondary | ICD-10-CM | POA: Diagnosis not present

## 2018-07-07 DIAGNOSIS — L02611 Cutaneous abscess of right foot: Secondary | ICD-10-CM | POA: Diagnosis not present

## 2018-07-07 DIAGNOSIS — B9561 Methicillin susceptible Staphylococcus aureus infection as the cause of diseases classified elsewhere: Secondary | ICD-10-CM | POA: Diagnosis not present

## 2018-07-07 DIAGNOSIS — I358 Other nonrheumatic aortic valve disorders: Secondary | ICD-10-CM | POA: Diagnosis not present

## 2018-07-07 DIAGNOSIS — T86822 Skin graft (allograft) (autograft) infection: Secondary | ICD-10-CM | POA: Diagnosis not present

## 2018-07-07 DIAGNOSIS — E1165 Type 2 diabetes mellitus with hyperglycemia: Secondary | ICD-10-CM | POA: Diagnosis not present

## 2018-07-07 DIAGNOSIS — E11628 Type 2 diabetes mellitus with other skin complications: Secondary | ICD-10-CM | POA: Diagnosis not present

## 2018-07-08 ENCOUNTER — Telehealth: Payer: Self-pay | Admitting: Orthopedic Surgery

## 2018-07-08 DIAGNOSIS — B9561 Methicillin susceptible Staphylococcus aureus infection as the cause of diseases classified elsewhere: Secondary | ICD-10-CM | POA: Diagnosis not present

## 2018-07-08 DIAGNOSIS — T86822 Skin graft (allograft) (autograft) infection: Secondary | ICD-10-CM | POA: Diagnosis not present

## 2018-07-08 DIAGNOSIS — E11628 Type 2 diabetes mellitus with other skin complications: Secondary | ICD-10-CM | POA: Diagnosis not present

## 2018-07-08 DIAGNOSIS — E1165 Type 2 diabetes mellitus with hyperglycemia: Secondary | ICD-10-CM | POA: Diagnosis not present

## 2018-07-08 DIAGNOSIS — I358 Other nonrheumatic aortic valve disorders: Secondary | ICD-10-CM | POA: Diagnosis not present

## 2018-07-08 DIAGNOSIS — L02611 Cutaneous abscess of right foot: Secondary | ICD-10-CM | POA: Diagnosis not present

## 2018-07-08 NOTE — Telephone Encounter (Signed)
06/05/2018 ov note faxed to [facility] for face to face [phone]

## 2018-07-09 ENCOUNTER — Other Ambulatory Visit: Payer: Self-pay

## 2018-07-09 NOTE — Patient Outreach (Signed)
[facility]) Care Management  07/09/2018  Sidni Lylah Lantis 1940/10/29 [id number]  BSW received follow up communication from [name] with [employer] [facility] regarding status of referral to volunteer program assisting patients with grocery shopping.  Per [name], many volunteers have returned to work due to [employer] restrictions being lightened so a volunteer is no longer available to provide this service for patient. BSW attempted to contact her today to provide the above information but she did not answer and her voicemail box was full.  BSW will attempt to reach her again tomorrow.  [name], BSW Social Worker [phone]

## 2018-07-10 ENCOUNTER — Ambulatory Visit: Payer: Self-pay

## 2018-07-10 ENCOUNTER — Ambulatory Visit: Payer: Medicare Other | Admitting: Orthopedic Surgery

## 2018-07-10 ENCOUNTER — Other Ambulatory Visit: Payer: Self-pay

## 2018-07-10 NOTE — Patient Outreach (Signed)
[facility]) Care Management  07/10/2018  Rosalina Rasheema [place] 09/12/1940 [id number]   BSW received follow up communication from [name] with [employer] [place] on 07/09/18 regarding status of referral to volunteer program assisting patients with grocery shopping.  Per [name], many volunteers have returned to work due to [employer] restrictions being lightened so a volunteer is no longer available to provide this service for patient.  Second unsuccessful attempt to contact patient to provide the above information.  Left voicemail message. Will attempt to reach again within four business days.   [name], BSW Social Worker [phone]

## 2018-07-11 DIAGNOSIS — E1165 Type 2 diabetes mellitus with hyperglycemia: Secondary | ICD-10-CM | POA: Diagnosis not present

## 2018-07-11 DIAGNOSIS — I358 Other nonrheumatic aortic valve disorders: Secondary | ICD-10-CM | POA: Diagnosis not present

## 2018-07-11 DIAGNOSIS — L02611 Cutaneous abscess of right foot: Secondary | ICD-10-CM | POA: Diagnosis not present

## 2018-07-11 DIAGNOSIS — T86822 Skin graft (allograft) (autograft) infection: Secondary | ICD-10-CM | POA: Diagnosis not present

## 2018-07-11 DIAGNOSIS — B9561 Methicillin susceptible Staphylococcus aureus infection as the cause of diseases classified elsewhere: Secondary | ICD-10-CM | POA: Diagnosis not present

## 2018-07-11 DIAGNOSIS — E11628 Type 2 diabetes mellitus with other skin complications: Secondary | ICD-10-CM | POA: Diagnosis not present

## 2018-07-14 ENCOUNTER — Ambulatory Visit: Payer: Self-pay

## 2018-07-14 ENCOUNTER — Other Ambulatory Visit: Payer: Self-pay

## 2018-07-14 DIAGNOSIS — E11628 Type 2 diabetes mellitus with other skin complications: Secondary | ICD-10-CM | POA: Diagnosis not present

## 2018-07-14 DIAGNOSIS — B9561 Methicillin susceptible Staphylococcus aureus infection as the cause of diseases classified elsewhere: Secondary | ICD-10-CM | POA: Diagnosis not present

## 2018-07-14 DIAGNOSIS — L02611 Cutaneous abscess of right foot: Secondary | ICD-10-CM | POA: Diagnosis not present

## 2018-07-14 DIAGNOSIS — T86822 Skin graft (allograft) (autograft) infection: Secondary | ICD-10-CM | POA: Diagnosis not present

## 2018-07-14 DIAGNOSIS — E1165 Type 2 diabetes mellitus with hyperglycemia: Secondary | ICD-10-CM | POA: Diagnosis not present

## 2018-07-14 DIAGNOSIS — I358 Other nonrheumatic aortic valve disorders: Secondary | ICD-10-CM | POA: Diagnosis not present

## 2018-07-14 NOTE — Patient Outreach (Signed)
[facility]) Care Management  07/14/2018  [name] Sep 13, 1940 [id number]   BSW received follow up communication from [name] with [employer] [facility] [place] on 07/09/18 regarding status of referral to volunteer program assisting patients with grocery shopping. Per [name], many volunteers have returned to work due to [employer] restrictions being lightened so a volunteer is no longer available to provide this service for patient.  Third unsuccessful attempt to contact patient to provide the above information.  Left voicemail message. BSW is closing case.    [name], BSW Social Worker [phone]

## 2018-07-15 NOTE — Progress Notes (Signed)
This encounter was created in error - please disregard.

## 2018-07-16 ENCOUNTER — Telehealth: Payer: Self-pay

## 2018-07-16 NOTE — Telephone Encounter (Signed)
I stated it was okay for this visit per Dr Sharol Given. Shanon Brow was called.

## 2018-07-16 NOTE — Telephone Encounter (Signed)
[name] from [facility]  called and states patient Cx appt today and he wanted to know if we can just omit visit from plan of treatment?   CB [phone]

## 2018-07-18 DIAGNOSIS — B9561 Methicillin susceptible Staphylococcus aureus infection as the cause of diseases classified elsewhere: Secondary | ICD-10-CM | POA: Diagnosis not present

## 2018-07-18 DIAGNOSIS — E1165 Type 2 diabetes mellitus with hyperglycemia: Secondary | ICD-10-CM | POA: Diagnosis not present

## 2018-07-18 DIAGNOSIS — I358 Other nonrheumatic aortic valve disorders: Secondary | ICD-10-CM | POA: Diagnosis not present

## 2018-07-18 DIAGNOSIS — Z4801 Encounter for change or removal of surgical wound dressing: Secondary | ICD-10-CM | POA: Diagnosis not present

## 2018-07-18 DIAGNOSIS — Z8543 Personal history of malignant neoplasm of ovary: Secondary | ICD-10-CM | POA: Diagnosis not present

## 2018-07-18 DIAGNOSIS — I10 Essential (primary) hypertension: Secondary | ICD-10-CM | POA: Diagnosis not present

## 2018-07-18 DIAGNOSIS — Z6834 Body mass index (BMI) 34.0-34.9, adult: Secondary | ICD-10-CM | POA: Diagnosis not present

## 2018-07-18 DIAGNOSIS — E11628 Type 2 diabetes mellitus with other skin complications: Secondary | ICD-10-CM | POA: Diagnosis not present

## 2018-07-18 DIAGNOSIS — T86822 Skin graft (allograft) (autograft) infection: Secondary | ICD-10-CM | POA: Diagnosis not present

## 2018-07-18 DIAGNOSIS — E876 Hypokalemia: Secondary | ICD-10-CM | POA: Diagnosis not present

## 2018-07-18 DIAGNOSIS — F0390 Unspecified dementia without behavioral disturbance: Secondary | ICD-10-CM | POA: Diagnosis not present

## 2018-07-18 DIAGNOSIS — D500 Iron deficiency anemia secondary to blood loss (chronic): Secondary | ICD-10-CM | POA: Diagnosis not present

## 2018-07-18 DIAGNOSIS — Z794 Long term (current) use of insulin: Secondary | ICD-10-CM | POA: Diagnosis not present

## 2018-07-18 DIAGNOSIS — F3289 Other specified depressive episodes: Secondary | ICD-10-CM | POA: Diagnosis not present

## 2018-07-18 DIAGNOSIS — L02611 Cutaneous abscess of right foot: Secondary | ICD-10-CM | POA: Diagnosis not present

## 2018-07-18 DIAGNOSIS — K219 Gastro-esophageal reflux disease without esophagitis: Secondary | ICD-10-CM | POA: Diagnosis not present

## 2018-07-21 DIAGNOSIS — E1165 Type 2 diabetes mellitus with hyperglycemia: Secondary | ICD-10-CM | POA: Diagnosis not present

## 2018-07-21 DIAGNOSIS — I358 Other nonrheumatic aortic valve disorders: Secondary | ICD-10-CM | POA: Diagnosis not present

## 2018-07-21 DIAGNOSIS — E11628 Type 2 diabetes mellitus with other skin complications: Secondary | ICD-10-CM | POA: Diagnosis not present

## 2018-07-21 DIAGNOSIS — L02611 Cutaneous abscess of right foot: Secondary | ICD-10-CM | POA: Diagnosis not present

## 2018-07-21 DIAGNOSIS — B9561 Methicillin susceptible Staphylococcus aureus infection as the cause of diseases classified elsewhere: Secondary | ICD-10-CM | POA: Diagnosis not present

## 2018-07-21 DIAGNOSIS — T86822 Skin graft (allograft) (autograft) infection: Secondary | ICD-10-CM | POA: Diagnosis not present

## 2018-07-23 DIAGNOSIS — L02611 Cutaneous abscess of right foot: Secondary | ICD-10-CM | POA: Diagnosis not present

## 2018-07-23 DIAGNOSIS — E11628 Type 2 diabetes mellitus with other skin complications: Secondary | ICD-10-CM | POA: Diagnosis not present

## 2018-07-23 DIAGNOSIS — I358 Other nonrheumatic aortic valve disorders: Secondary | ICD-10-CM | POA: Diagnosis not present

## 2018-07-23 DIAGNOSIS — B9561 Methicillin susceptible Staphylococcus aureus infection as the cause of diseases classified elsewhere: Secondary | ICD-10-CM | POA: Diagnosis not present

## 2018-07-23 DIAGNOSIS — E1165 Type 2 diabetes mellitus with hyperglycemia: Secondary | ICD-10-CM | POA: Diagnosis not present

## 2018-07-23 DIAGNOSIS — T86822 Skin graft (allograft) (autograft) infection: Secondary | ICD-10-CM | POA: Diagnosis not present

## 2018-07-24 DIAGNOSIS — T86822 Skin graft (allograft) (autograft) infection: Secondary | ICD-10-CM | POA: Diagnosis not present

## 2018-07-24 DIAGNOSIS — I358 Other nonrheumatic aortic valve disorders: Secondary | ICD-10-CM | POA: Diagnosis not present

## 2018-07-24 DIAGNOSIS — E1165 Type 2 diabetes mellitus with hyperglycemia: Secondary | ICD-10-CM | POA: Diagnosis not present

## 2018-07-24 DIAGNOSIS — L02611 Cutaneous abscess of right foot: Secondary | ICD-10-CM | POA: Diagnosis not present

## 2018-07-24 DIAGNOSIS — E11628 Type 2 diabetes mellitus with other skin complications: Secondary | ICD-10-CM | POA: Diagnosis not present

## 2018-07-24 DIAGNOSIS — B9561 Methicillin susceptible Staphylococcus aureus infection as the cause of diseases classified elsewhere: Secondary | ICD-10-CM | POA: Diagnosis not present

## 2018-07-28 DIAGNOSIS — L02611 Cutaneous abscess of right foot: Secondary | ICD-10-CM | POA: Diagnosis not present

## 2018-07-28 DIAGNOSIS — I358 Other nonrheumatic aortic valve disorders: Secondary | ICD-10-CM | POA: Diagnosis not present

## 2018-07-28 DIAGNOSIS — B9561 Methicillin susceptible Staphylococcus aureus infection as the cause of diseases classified elsewhere: Secondary | ICD-10-CM | POA: Diagnosis not present

## 2018-07-28 DIAGNOSIS — T86822 Skin graft (allograft) (autograft) infection: Secondary | ICD-10-CM | POA: Diagnosis not present

## 2018-07-28 DIAGNOSIS — E11628 Type 2 diabetes mellitus with other skin complications: Secondary | ICD-10-CM | POA: Diagnosis not present

## 2018-07-28 DIAGNOSIS — E1165 Type 2 diabetes mellitus with hyperglycemia: Secondary | ICD-10-CM | POA: Diagnosis not present

## 2018-07-30 ENCOUNTER — Telehealth: Payer: Self-pay | Admitting: Nurse Practitioner

## 2018-07-30 DIAGNOSIS — L02611 Cutaneous abscess of right foot: Secondary | ICD-10-CM | POA: Diagnosis not present

## 2018-07-30 DIAGNOSIS — E1165 Type 2 diabetes mellitus with hyperglycemia: Secondary | ICD-10-CM | POA: Diagnosis not present

## 2018-07-30 DIAGNOSIS — E11628 Type 2 diabetes mellitus with other skin complications: Secondary | ICD-10-CM | POA: Diagnosis not present

## 2018-07-30 DIAGNOSIS — I358 Other nonrheumatic aortic valve disorders: Secondary | ICD-10-CM | POA: Diagnosis not present

## 2018-07-30 DIAGNOSIS — B9561 Methicillin susceptible Staphylococcus aureus infection as the cause of diseases classified elsewhere: Secondary | ICD-10-CM | POA: Diagnosis not present

## 2018-07-30 DIAGNOSIS — T86822 Skin graft (allograft) (autograft) infection: Secondary | ICD-10-CM | POA: Diagnosis not present

## 2018-07-30 NOTE — Telephone Encounter (Signed)
Lyndsey called from [facility] to set up an office visit for patient to insert monitor.  Line was busy sent CRM  Patient call back [phone]

## 2018-07-30 NOTE — Telephone Encounter (Signed)
Will need to schedule OV to help insert monitor

## 2018-07-30 NOTE — Telephone Encounter (Signed)
Pt stated she changed out the freestyle libre about 3-4 days ago and since then it is not working. Pt was wondering what to do. Please advise   Copied from [place] [id number]. Topic: General - Call Back - No Documentation >> Jul 30, 2018 11:13 AM Erick Blinks wrote: Reason for CRM: Pt called requesting call back from Nurse. Please advise (phone disconnected before any other information could be gathered) Please advise

## 2018-07-31 NOTE — Telephone Encounter (Signed)
appt made on 08/05/2018 F2F with [name]. FYI

## 2018-07-31 NOTE — Telephone Encounter (Signed)
LVM on [facility] vm for the pt to return call. Unable to leave vm on pt's phone.

## 2018-08-04 DIAGNOSIS — I358 Other nonrheumatic aortic valve disorders: Secondary | ICD-10-CM | POA: Diagnosis not present

## 2018-08-04 DIAGNOSIS — E1165 Type 2 diabetes mellitus with hyperglycemia: Secondary | ICD-10-CM | POA: Diagnosis not present

## 2018-08-04 DIAGNOSIS — L02611 Cutaneous abscess of right foot: Secondary | ICD-10-CM | POA: Diagnosis not present

## 2018-08-04 DIAGNOSIS — E11628 Type 2 diabetes mellitus with other skin complications: Secondary | ICD-10-CM | POA: Diagnosis not present

## 2018-08-04 DIAGNOSIS — B9561 Methicillin susceptible Staphylococcus aureus infection as the cause of diseases classified elsewhere: Secondary | ICD-10-CM | POA: Diagnosis not present

## 2018-08-04 DIAGNOSIS — T86822 Skin graft (allograft) (autograft) infection: Secondary | ICD-10-CM | POA: Diagnosis not present

## 2018-08-05 ENCOUNTER — Other Ambulatory Visit: Payer: Self-pay | Admitting: Nurse Practitioner

## 2018-08-05 ENCOUNTER — Encounter: Payer: Medicare Other | Admitting: Nurse Practitioner

## 2018-08-05 ENCOUNTER — Encounter: Payer: Self-pay | Admitting: Nurse Practitioner

## 2018-08-05 ENCOUNTER — Ambulatory Visit: Payer: Medicare Other | Admitting: Nurse Practitioner

## 2018-08-05 DIAGNOSIS — Z794 Long term (current) use of insulin: Secondary | ICD-10-CM

## 2018-08-05 DIAGNOSIS — E1165 Type 2 diabetes mellitus with hyperglycemia: Secondary | ICD-10-CM

## 2018-08-06 ENCOUNTER — Telehealth: Payer: Self-pay | Admitting: Radiology

## 2018-08-06 ENCOUNTER — Ambulatory Visit: Payer: Medicare Other | Admitting: Nurse Practitioner

## 2018-08-06 NOTE — Telephone Encounter (Signed)
Shanon Brow with [facility] requests HHPT orders for 1x week x 2 weeks and at that point he will call for additional extension of orders. Please advise.  CB for Shanon Brow [phone]

## 2018-08-06 NOTE — Telephone Encounter (Signed)
fyi

## 2018-08-06 NOTE — Progress Notes (Signed)
This encounter was created in error - please disregard.

## 2018-08-06 NOTE — Telephone Encounter (Signed)
Pt's husband called to cancel OV today due to pharmacy stating they would not have receiver available for 48 hours. Please advise.

## 2018-08-06 NOTE — Telephone Encounter (Signed)
SPoke with [name]. appt set for 08/08/18

## 2018-08-06 NOTE — Telephone Encounter (Signed)
Shanon Brow was called and given VO for patient's HHPT orders.

## 2018-08-07 DIAGNOSIS — L02611 Cutaneous abscess of right foot: Secondary | ICD-10-CM | POA: Diagnosis not present

## 2018-08-07 DIAGNOSIS — E1165 Type 2 diabetes mellitus with hyperglycemia: Secondary | ICD-10-CM | POA: Diagnosis not present

## 2018-08-07 DIAGNOSIS — E11628 Type 2 diabetes mellitus with other skin complications: Secondary | ICD-10-CM | POA: Diagnosis not present

## 2018-08-07 DIAGNOSIS — I358 Other nonrheumatic aortic valve disorders: Secondary | ICD-10-CM | POA: Diagnosis not present

## 2018-08-07 DIAGNOSIS — B9561 Methicillin susceptible Staphylococcus aureus infection as the cause of diseases classified elsewhere: Secondary | ICD-10-CM | POA: Diagnosis not present

## 2018-08-07 DIAGNOSIS — T86822 Skin graft (allograft) (autograft) infection: Secondary | ICD-10-CM | POA: Diagnosis not present

## 2018-08-08 ENCOUNTER — Ambulatory Visit: Payer: Medicare Other | Admitting: Nurse Practitioner

## 2018-08-11 DIAGNOSIS — T86822 Skin graft (allograft) (autograft) infection: Secondary | ICD-10-CM | POA: Diagnosis not present

## 2018-08-11 DIAGNOSIS — B9561 Methicillin susceptible Staphylococcus aureus infection as the cause of diseases classified elsewhere: Secondary | ICD-10-CM | POA: Diagnosis not present

## 2018-08-11 DIAGNOSIS — E1165 Type 2 diabetes mellitus with hyperglycemia: Secondary | ICD-10-CM | POA: Diagnosis not present

## 2018-08-11 DIAGNOSIS — I358 Other nonrheumatic aortic valve disorders: Secondary | ICD-10-CM | POA: Diagnosis not present

## 2018-08-11 DIAGNOSIS — L02611 Cutaneous abscess of right foot: Secondary | ICD-10-CM | POA: Diagnosis not present

## 2018-08-11 DIAGNOSIS — E11628 Type 2 diabetes mellitus with other skin complications: Secondary | ICD-10-CM | POA: Diagnosis not present

## 2018-08-13 DIAGNOSIS — I358 Other nonrheumatic aortic valve disorders: Secondary | ICD-10-CM | POA: Diagnosis not present

## 2018-08-13 DIAGNOSIS — L02611 Cutaneous abscess of right foot: Secondary | ICD-10-CM | POA: Diagnosis not present

## 2018-08-13 DIAGNOSIS — E1165 Type 2 diabetes mellitus with hyperglycemia: Secondary | ICD-10-CM | POA: Diagnosis not present

## 2018-08-13 DIAGNOSIS — T86822 Skin graft (allograft) (autograft) infection: Secondary | ICD-10-CM | POA: Diagnosis not present

## 2018-08-13 DIAGNOSIS — B9561 Methicillin susceptible Staphylococcus aureus infection as the cause of diseases classified elsewhere: Secondary | ICD-10-CM | POA: Diagnosis not present

## 2018-08-13 DIAGNOSIS — E11628 Type 2 diabetes mellitus with other skin complications: Secondary | ICD-10-CM | POA: Diagnosis not present

## 2018-08-14 DIAGNOSIS — E1165 Type 2 diabetes mellitus with hyperglycemia: Secondary | ICD-10-CM | POA: Diagnosis not present

## 2018-08-14 DIAGNOSIS — T86822 Skin graft (allograft) (autograft) infection: Secondary | ICD-10-CM | POA: Diagnosis not present

## 2018-08-14 DIAGNOSIS — B9561 Methicillin susceptible Staphylococcus aureus infection as the cause of diseases classified elsewhere: Secondary | ICD-10-CM | POA: Diagnosis not present

## 2018-08-14 DIAGNOSIS — I358 Other nonrheumatic aortic valve disorders: Secondary | ICD-10-CM | POA: Diagnosis not present

## 2018-08-14 DIAGNOSIS — L02611 Cutaneous abscess of right foot: Secondary | ICD-10-CM | POA: Diagnosis not present

## 2018-08-14 DIAGNOSIS — E11628 Type 2 diabetes mellitus with other skin complications: Secondary | ICD-10-CM | POA: Diagnosis not present

## 2018-08-17 DIAGNOSIS — I358 Other nonrheumatic aortic valve disorders: Secondary | ICD-10-CM | POA: Diagnosis not present

## 2018-08-17 DIAGNOSIS — F3289 Other specified depressive episodes: Secondary | ICD-10-CM | POA: Diagnosis not present

## 2018-08-17 DIAGNOSIS — Z6834 Body mass index (BMI) 34.0-34.9, adult: Secondary | ICD-10-CM | POA: Diagnosis not present

## 2018-08-17 DIAGNOSIS — F0390 Unspecified dementia without behavioral disturbance: Secondary | ICD-10-CM | POA: Diagnosis not present

## 2018-08-17 DIAGNOSIS — L02611 Cutaneous abscess of right foot: Secondary | ICD-10-CM | POA: Diagnosis not present

## 2018-08-17 DIAGNOSIS — I10 Essential (primary) hypertension: Secondary | ICD-10-CM | POA: Diagnosis not present

## 2018-08-17 DIAGNOSIS — B9561 Methicillin susceptible Staphylococcus aureus infection as the cause of diseases classified elsewhere: Secondary | ICD-10-CM | POA: Diagnosis not present

## 2018-08-17 DIAGNOSIS — E1165 Type 2 diabetes mellitus with hyperglycemia: Secondary | ICD-10-CM | POA: Diagnosis not present

## 2018-08-17 DIAGNOSIS — T86822 Skin graft (allograft) (autograft) infection: Secondary | ICD-10-CM | POA: Diagnosis not present

## 2018-08-17 DIAGNOSIS — D500 Iron deficiency anemia secondary to blood loss (chronic): Secondary | ICD-10-CM | POA: Diagnosis not present

## 2018-08-17 DIAGNOSIS — E11628 Type 2 diabetes mellitus with other skin complications: Secondary | ICD-10-CM | POA: Diagnosis not present

## 2018-08-17 DIAGNOSIS — Z4801 Encounter for change or removal of surgical wound dressing: Secondary | ICD-10-CM | POA: Diagnosis not present

## 2018-08-17 DIAGNOSIS — E876 Hypokalemia: Secondary | ICD-10-CM | POA: Diagnosis not present

## 2018-08-17 DIAGNOSIS — K219 Gastro-esophageal reflux disease without esophagitis: Secondary | ICD-10-CM | POA: Diagnosis not present

## 2018-08-17 DIAGNOSIS — Z8543 Personal history of malignant neoplasm of ovary: Secondary | ICD-10-CM | POA: Diagnosis not present

## 2018-08-22 ENCOUNTER — Telehealth: Payer: Self-pay

## 2018-08-22 NOTE — Telephone Encounter (Signed)
[name] with [facility] home health called stating that he has not been able to follow plan of care due to patient being difficult to contact and refusing services.  Patient will be discharged next week due to being non-complaint.  Cb# is [phone].  Please advise.  Thank you.

## 2018-08-25 DIAGNOSIS — T86822 Skin graft (allograft) (autograft) infection: Secondary | ICD-10-CM | POA: Diagnosis not present

## 2018-08-25 DIAGNOSIS — B9561 Methicillin susceptible Staphylococcus aureus infection as the cause of diseases classified elsewhere: Secondary | ICD-10-CM | POA: Diagnosis not present

## 2018-08-25 DIAGNOSIS — I358 Other nonrheumatic aortic valve disorders: Secondary | ICD-10-CM | POA: Diagnosis not present

## 2018-08-25 DIAGNOSIS — E1165 Type 2 diabetes mellitus with hyperglycemia: Secondary | ICD-10-CM | POA: Diagnosis not present

## 2018-08-25 DIAGNOSIS — E11628 Type 2 diabetes mellitus with other skin complications: Secondary | ICD-10-CM | POA: Diagnosis not present

## 2018-08-25 DIAGNOSIS — L02611 Cutaneous abscess of right foot: Secondary | ICD-10-CM | POA: Diagnosis not present

## 2018-08-28 ENCOUNTER — Telehealth: Payer: Self-pay | Admitting: Orthopedic Surgery

## 2018-08-28 DIAGNOSIS — E11628 Type 2 diabetes mellitus with other skin complications: Secondary | ICD-10-CM | POA: Diagnosis not present

## 2018-08-28 DIAGNOSIS — T86822 Skin graft (allograft) (autograft) infection: Secondary | ICD-10-CM | POA: Diagnosis not present

## 2018-08-28 DIAGNOSIS — I358 Other nonrheumatic aortic valve disorders: Secondary | ICD-10-CM | POA: Diagnosis not present

## 2018-08-28 DIAGNOSIS — L02611 Cutaneous abscess of right foot: Secondary | ICD-10-CM | POA: Diagnosis not present

## 2018-08-28 DIAGNOSIS — E1165 Type 2 diabetes mellitus with hyperglycemia: Secondary | ICD-10-CM | POA: Diagnosis not present

## 2018-08-28 DIAGNOSIS — B9561 Methicillin susceptible Staphylococcus aureus infection as the cause of diseases classified elsewhere: Secondary | ICD-10-CM | POA: Diagnosis not present

## 2018-08-28 NOTE — Telephone Encounter (Signed)
Can you please call pt and make an appt for follow up of her right foot?

## 2018-08-28 NOTE — Telephone Encounter (Signed)
I called and sw Kim to advise that we have not seen the pt since 05/2018 and that she was supposed to be a 2 week follow up. She was to use a compression sock at that time for wound care. I advised that we will reach out to the pt to try and make appt and she can follow up with pt at next visit to encourage pt to make appt as well.

## 2018-08-28 NOTE — Telephone Encounter (Signed)
Kimberly/[facility] called and stated that they needed verbal orders for wound care.  Please call Kimberely @ [phone]

## 2018-08-28 NOTE — Telephone Encounter (Signed)
Called patient [name] to return call to schedule an appointment with Junie Panning or Dr Sharol Given for follow up right foot

## 2018-08-29 ENCOUNTER — Other Ambulatory Visit: Payer: Self-pay

## 2018-08-29 ENCOUNTER — Telehealth: Payer: Self-pay | Admitting: Orthopedic Surgery

## 2018-08-29 ENCOUNTER — Encounter: Payer: Self-pay | Admitting: Family

## 2018-08-29 ENCOUNTER — Ambulatory Visit (INDEPENDENT_AMBULATORY_CARE_PROVIDER_SITE_OTHER): Payer: Medicare Other | Admitting: Family

## 2018-08-29 VITALS — Ht 64.0 in | Wt 212.0 lb

## 2018-08-29 DIAGNOSIS — L97512 Non-pressure chronic ulcer of other part of right foot with fat layer exposed: Secondary | ICD-10-CM | POA: Diagnosis not present

## 2018-08-29 DIAGNOSIS — Z945 Skin transplant status: Secondary | ICD-10-CM

## 2018-08-29 DIAGNOSIS — Z794 Long term (current) use of insulin: Secondary | ICD-10-CM

## 2018-08-29 DIAGNOSIS — E1142 Type 2 diabetes mellitus with diabetic polyneuropathy: Secondary | ICD-10-CM | POA: Diagnosis not present

## 2018-08-29 NOTE — Telephone Encounter (Signed)
Patient came in and was seen today 08/29/2018

## 2018-08-29 NOTE — Telephone Encounter (Signed)
Called patient left message to return call to schedule an appointment with Dr Sharol Given for her right foot

## 2018-08-29 NOTE — Progress Notes (Signed)
Office Visit Note   Patient: [name]           Date of Birth: 1940/02/12           MRN: [id number] Visit Date: 08/29/2018              Requested by: Flossie Buffy, NP [place],  [place] [address] PCP: Flossie Buffy, NP  No chief complaint on file.     HPI: The patient is a 78 year old woman seen today in follow-up for ulceration of the right foot this is to the lateral aspect.  Last seen in the office in May of this year.  Her irrigation debridement with split thickness skin grafting was back in March of this year.  Has been having home health assistance they have most recently been doing dry dressing changes.  At last office visit was instructed to get the medical compression sock and wear this with direct skin contact however the patient has yet to buy this.  Requesting a nail trim today as well she is nonweightbearing in a wheelchair.   Assessment & Plan: Visit Diagnoses:  No diagnosis found.  Plan: A Prisma was applied to the wound overlying gauze and Ace wrap she will continue with her home health they may be begin using Silvadene again we will do this twice weekly.  She will follow-up in the office in 4 weeks.    Follow-Up Instructions: No follow-ups on file.   Ortho Exam  Patient is alert, oriented, no adenopathy, well-dressed, normal affect, normal respiratory effort.  The wound to the right foot lateral aspect has about 50% beefy tissue 50% yellow fibrinous tissue there is no active drainage no surrounding erythema no maceration no odor no sign of infection mild edema to her foot See photograph attached to the encounter.  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  These were trimmed today in the office without incident.  Imaging: No results found.   Labs: Lab Results  Component Value Date   HGBA1C 7.5 (H) 06/27/2018   HGBA1C 13.5 (H)  04/17/2018   HGBA1C 12.5 (H) 03/09/2016   ESRSEDRATE 56 (H) 04/20/2018   REPTSTATUS 04/28/2018 FINAL 04/22/2018   GRAMSTAIN  04/22/2018    MODERATE WBC PRESENT, PREDOMINANTLY PMN MODERATE GRAM POSITIVE COCCI RARE GRAM POSITIVE RODS    CULT  04/22/2018    MODERATE GROUP B STREP(S.AGALACTIAE)ISOLATED TESTING AGAINST S. AGALACTIAE NOT ROUTINELY PERFORMED DUE TO PREDICTABILITY OF AMP/PEN/VAN SUSCEPTIBILITY. RARE STAPHYLOCOCCUS AUREUS NO ANAEROBES ISOLATED Performed at [facility], [place] [address], [place], [place] [address]    [place] 04/22/2018     Lab Results  Component Value Date   ALBUMIN 3.5 04/20/2018   ALBUMIN 4.0 04/17/2018   ALBUMIN 3.0 (L) 01/30/2015    There is no height or weight on file to calculate BMI.  Orders:  No orders of the defined types were placed in this encounter.  No orders of the defined types were placed in this encounter.    Procedures: No procedures performed  Clinical Data: No additional findings.  ROS:  All other systems negative, except as noted in the HPI. Review of Systems  Constitutional: Negative for chills and fever.  Skin: Positive for wound.    Objective: Vital Signs: There were no vitals taken for this visit.  Specialty Comments:  No specialty comments available.  PMFS History: Patient Active Problem List  Diagnosis Date Noted  . 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 Bacteremia due to methicillin susceptible Staphylococcus aureus (MSSA) 04/21/2018  . Abscess of foot   . Diabetic foot infection ([place]) 04/20/2018  . Iron deficiency 04/18/2018  . Foreign body in right foot 04/18/2018  . Cellulitis of right leg 04/18/2018  . Type 2 diabetes mellitus with diabetic polyneuropathy, without long-term current use of insulin ([place]) 04/12/2016  . Vitamin D deficiency 03/06/2016  . 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 Essential hypertension 03/21/2015  . Vision decreased 03/21/2015  .  Tension-type headache, not intractable 03/21/2015  . Morbid obesity ([place]) 02/02/2015  . Anemia 01/30/2015   Past Medical History:  Diagnosis Date  . Abdominal distension   . Anemia   . Anxiety   . B12 deficiency   . Balance problem   . Blood in stool   . Cancer (HCC)    OVARIAN  . Colon polyps   . Dementia ([place])    EARLY  . Dementia ([place])    EARLY  . Depression   . GERD (gastroesophageal reflux disease)   . History of blood transfusion   . Hypertension   . Hypomagnesemia   . Urine incontinence     Family History  Family history unknown: Yes    Past Surgical History:  Procedure Laterality Date  . ABDOMINAL HYSTERECTOMY    . APPLICATION OF WOUND VAC  04/25/2018   Procedure: Application Of Wound Vac;  Surgeon: Newt Minion, MD;  Location: [facility];  Service: Orthopedics;;  . CATARACT EXTRACTION W/PHACO Right 07/26/2015   Procedure: CATARACT EXTRACTION PHACO AND INTRAOCULAR LENS PLACEMENT ([place]);  Surgeon: Birder Robson, MD;  Location: [facility];  Service: Ophthalmology;  Laterality: Right;  [name]   1:03.5 AP%  23% CDE   14.59 fluid pack lot # [id number]  . CATARACT EXTRACTION W/PHACO Left 08/29/2015   Procedure: CATARACT EXTRACTION PHACO AND INTRAOCULAR LENS PLACEMENT ([place]);  Surgeon: Birder Robson, MD;  Location: [facility];  Service: Ophthalmology;  Laterality: Left;  [name] 00:40 AP% 20.9 CDE 8.50 fluid pack lot # [id number]  . COLONOSCOPY WITH PROPOFOL N/A 03/14/2015   Procedure: COLONOSCOPY WITH PROPOFOL;  Surgeon: Hulen Luster, MD;  Location[facility];  Service: Gastroenterology;  Laterality: N/A;  . ESOPHAGOGASTRODUODENOSCOPY (EGD) WITH PROPOFOL N/A 03/14/2015   Procedure: ESOPHAGOGASTRODUODENOSCOPY (EGD) WITH PROPOFOL;  Surgeon: Hulen Luster, MD;  Location: [facility] END[facility]COPY;  Service: Gastroenterology;  Laterality: N/A;  . I&D EXTREMITY Right 04/22/2018   Procedure: RIGHT FOOT DEBRIDEMENT AND FOREIGN BODY REMOVAL;  Surgeon: Newt Minion, MD;  Location: [place];  Service:  Orthopedics;  Laterality: Right;  . none    . SKIN SPLIT GRAFT Right 04/25/2018   Procedure: REPEAT DEBRIDEMENT RIGHT FOOT, SPLIT THICKNESS SKIN GRAFT, APPLY VAC;  Surgeon: Newt Minion, MD;  Location: [place] [facility];  Service: Orthopedics;  Laterality: Right;  . TOTAL ABDOMINAL HYSTERECTOMY W/ BILATERAL SALPINGOOPHORECTOMY  04/19/2015   exploratory laparoscopy, total abdominal hysterectomy, bilateral salping oophorectomy, left pelvic lymph node dissection, omentectomy   Social History   Occupational History  . Not on file  Tobacco Use  . Smoking status: Never Smoker  . Smokeless tobacco: Never Used  Substance and Sexual Activity  . Alcohol use: No  . Drug use: No  . Sexual activity: Not Currently

## 2018-09-08 DIAGNOSIS — E11628 Type 2 diabetes mellitus with other skin complications: Secondary | ICD-10-CM | POA: Diagnosis not present

## 2018-09-08 DIAGNOSIS — B9561 Methicillin susceptible Staphylococcus aureus infection as the cause of diseases classified elsewhere: Secondary | ICD-10-CM | POA: Diagnosis not present

## 2018-09-08 DIAGNOSIS — I358 Other nonrheumatic aortic valve disorders: Secondary | ICD-10-CM | POA: Diagnosis not present

## 2018-09-08 DIAGNOSIS — L02611 Cutaneous abscess of right foot: Secondary | ICD-10-CM | POA: Diagnosis not present

## 2018-09-08 DIAGNOSIS — T86822 Skin graft (allograft) (autograft) infection: Secondary | ICD-10-CM | POA: Diagnosis not present

## 2018-09-08 DIAGNOSIS — E1165 Type 2 diabetes mellitus with hyperglycemia: Secondary | ICD-10-CM | POA: Diagnosis not present

## 2018-09-11 DIAGNOSIS — B9561 Methicillin susceptible Staphylococcus aureus infection as the cause of diseases classified elsewhere: Secondary | ICD-10-CM | POA: Diagnosis not present

## 2018-09-11 DIAGNOSIS — E11628 Type 2 diabetes mellitus with other skin complications: Secondary | ICD-10-CM | POA: Diagnosis not present

## 2018-09-11 DIAGNOSIS — T86822 Skin graft (allograft) (autograft) infection: Secondary | ICD-10-CM | POA: Diagnosis not present

## 2018-09-11 DIAGNOSIS — L02611 Cutaneous abscess of right foot: Secondary | ICD-10-CM | POA: Diagnosis not present

## 2018-09-11 DIAGNOSIS — I358 Other nonrheumatic aortic valve disorders: Secondary | ICD-10-CM | POA: Diagnosis not present

## 2018-09-11 DIAGNOSIS — E1165 Type 2 diabetes mellitus with hyperglycemia: Secondary | ICD-10-CM | POA: Diagnosis not present

## 2018-09-12 ENCOUNTER — Encounter: Payer: Self-pay | Admitting: Nurse Practitioner

## 2018-09-12 ENCOUNTER — Other Ambulatory Visit: Payer: Self-pay

## 2018-09-12 ENCOUNTER — Ambulatory Visit (INDEPENDENT_AMBULATORY_CARE_PROVIDER_SITE_OTHER): Payer: Medicare Other | Admitting: Nurse Practitioner

## 2018-09-12 VITALS — BP 142/80 | HR 76 | Temp 98.1°F | Ht 64.0 in | Wt 218.2 lb

## 2018-09-12 DIAGNOSIS — I10 Essential (primary) hypertension: Secondary | ICD-10-CM | POA: Diagnosis not present

## 2018-09-12 DIAGNOSIS — E1142 Type 2 diabetes mellitus with diabetic polyneuropathy: Secondary | ICD-10-CM | POA: Diagnosis not present

## 2018-09-12 LAB — HEMOGLOBIN A1C: Hgb A1c MFr Bld: 7.4 % — ABNORMAL HIGH (ref 4.6–6.5)

## 2018-09-12 LAB — BASIC METABOLIC PANEL
BUN: 18 mg/dL (ref 6–23)
CO2: 30 mEq/L (ref 19–32)
Calcium: 10 mg/dL (ref 8.4–10.5)
Chloride: 101 mEq/L (ref 96–112)
Creatinine, Ser: 0.96 mg/dL (ref 0.40–1.20)
GFR: 68.29 mL/min (ref >60.00)
Glucose, Bld: 130 mg/dL — ABNORMAL HIGH (ref 70–99)
Potassium: 4.5 mEq/L (ref 3.5–5.1)
Sodium: 139 mEq/L (ref 135–145)

## 2018-09-12 LAB — GLUCOSE, POCT (MANUAL RESULT ENTRY): POC Glucose: 148 mg/dl — AB (ref 70–99)

## 2018-09-12 MED ORDER — ROSUVASTATIN CALCIUM 10 MG PO TABS: 10.0000 mg | ORAL_TABLET | Freq: Every day | ORAL | 1 refills | Status: DC

## 2018-09-12 MED ORDER — BLOOD GLUCOSE MONITOR KIT: PACK | 0 refills | Status: AC

## 2018-09-12 MED ORDER — GLIPIZIDE ER 2.5 MG PO TB24: 2.5000 mg | ORAL_TABLET | Freq: Every day | ORAL | 1 refills | Status: DC

## 2018-09-12 NOTE — Progress Notes (Signed)
Subjective:  Patient ID: [name], female    DOB: 1940-10-27  Age: 78 y.o. MRN: [id number]  CC: Follow-up (follow up on insuline,med consult?)  HPI DM: Unable to afford libre freestyle sensors, so has not been checking glucose at home. Last hgbA1c of 7.5 Reports compliance with glipizide. She declined to take metformin. Needs Statin added Needs referral to ophthalmology. Continues f/up with ortho for right foot surgical wound.  HTN: Stable with amlodipine, lisinopril, and hydralazine. BP Readings from Last 3 Encounters:  09/12/18 (!) 142/80  08/05/18 136/78  06/27/18 130/74   Reviewed past Medical, Social and Family history today.  Outpatient Medications Prior to Visit  Medication Sig Dispense Refill  . amLODipine (NORVASC) 5 MG tablet Take 1 tablet (5 mg total) by mouth at bedtime. 90 tablet 1  . Continuous Blood Gluc Receiver (FREESTYLE LIBRE READER) DEVI 1 Units by Does not apply route every 14 (fourteen) days. 1 Device 0  . Continuous Blood Gluc Sensor (FREESTYLE LIBRE 14 DAY SENSOR) MISC 1 Units by Does not apply route every 14 (fourteen) days. 2 each 2  . hydrALAZINE (APRESOLINE) 10 MG tablet Take 1 tablet (10 mg total) by mouth 3 (three) times daily. 90 tablet 5  . lisinopril (PRINIVIL,ZESTRIL) 5 MG tablet Take 1 tablet (5 mg total) by mouth daily. 90 tablet 3  . citalopram (CELEXA) 10 MG tablet Take 10 mg by mouth daily.    . fenofibrate (TRICOR) 145 MG tablet Take 1 tablet (145 mg total) by mouth daily. 30 tablet 3  . glipiZIDE (GLIPIZIDE XL) 2.5 MG 24 hr tablet Take 1 tablet (2.5 mg total) by mouth daily with breakfast. 90 tablet 0  . methocarbamol (ROBAXIN) 500 MG tablet Take 1 tablet (500 mg total) by mouth every 6 (six) hours as needed for muscle spasms.    . cholecalciferol (VITAMIN D) 400 units TABS tablet Take 1,000 Units by mouth daily.     No facility-administered medications prior to visit.     ROS See HPI  Objective:  BP (!) 142/80    Pulse 76   Temp 98.1 F (36.7 C) (Tympanic)   Ht _0  (1.626 m)   Wt 218 lb 3.2 oz (99 kg)   SpO2 98%   BMI 37.45 kg/m   BP Readings from Last 3 Encounters:  09/12/18 (!) 142/80  08/05/18 136/78  06/27/18 130/74    Wt Readings from Last 3 Encounters:  09/12/18 218 lb 3.2 oz (99 kg)  08/29/18 212 lb (96.2 kg)  08/05/18 212 lb 6.4 oz (96.3 kg)    Physical Exam Vitals signs reviewed.  Constitutional:      Appearance: She is obese.  Cardiovascular:     Rate and Rhythm: Normal rate.     Heart sounds: Gallop present.   Pulmonary:     Effort: Pulmonary effort is normal.     Breath sounds: Normal breath sounds.  Musculoskeletal:     Right lower leg: No edema.     Left lower leg: No edema.  Neurological:     Mental Status: She is alert.     Gait: Gait abnormal.     Comments: Ambulates with cane due to right foot surgical wound and dressing  Psychiatric:        Mood and Affect: Mood normal.        Behavior: Behavior normal.     Lab Results  Component Value Date   WBC 5.9 06/27/2018   HGB 11.8 06/27/2018   HCT 34.9 (  L) 06/27/2018   PLT 456 (H) 06/27/2018   GLUCOSE 130 (H) 09/12/2018   CHOL 194 04/17/2018   TRIG 111.0 04/17/2018   HDL 46.10 04/17/2018   LDLDIRECT 139.0 03/06/2016   LDLCALC 126 (H) 04/17/2018   ALT 18 04/20/2018   AST 18 04/20/2018   NA 139 09/12/2018   K 4.5 09/12/2018   CL 101 09/12/2018   CREATININE 0.96 09/12/2018   BUN 18 09/12/2018   CO2 30 09/12/2018   TSH 1.74 04/17/2018   INR 1.1 07/26/2013   HGBA1C 7.4 (H) 09/12/2018   MICROALBUR 1.2 06/27/2018    Dg Foot Complete Right  Result Date: 04/20/2018 CLINICAL DATA:  Foot pain and swelling EXAM: RIGHT FOOT COMPLETE - 3+ VIEW COMPARISON:  04/17/2018 FINDINGS: Previously seen linear foreign body in the plantar soft tissues of the foot is again identified with appears slightly deeper. Adjacent soft tissue swelling is noted consistent with the given clinical history. No acute fracture is  seen. Tarsal degenerative changes. IMPRESSION: No acute fractures noted. Previously seen foreign body is again identified slightly deeper in the plantar soft tissues. Electronically Signed   By: Inez Catalina M.D.   On: 04/20/2018 21:14    Assessment & Plan:   [name] was seen today for follow-up.  Diagnoses and all orders for this visit:  Type 2 diabetes mellitus with diabetic polyneuropathy, without long-term current use of insulin (HCC) -     Hemoglobin A1c -     Basic metabolic panel -     rosuvastatin (CRESTOR) 10 MG tablet; Take 1 tablet (10 mg total) by mouth daily. -     blood glucose meter kit and supplies KIT; Dispense based on patient and insurance preference. Check glucose before breakfast and before supper. ICD10: 11.42 -     POCT Glucose (CBG) -     Ambulatory referral to Ophthalmology -     glipiZIDE (GLIPIZIDE XL) 2.5 MG 24 hr tablet; Take 1 tablet (2.5 mg total) by mouth daily with breakfast.  Essential hypertension -     Basic metabolic panel   I have discontinued [name] citalopram, fenofibrate, and methocarbamol. I am also having her start on rosuvastatin and blood glucose meter kit and supplies. Additionally, I am having her maintain her cholecalciferol, FreeStyle Libre Reader, FreeStyle Libre 14 Day Sensor, lisinopril, hydrALAZINE, amLODipine, and glipiZIDE.  Meds ordered this encounter  Medications  . rosuvastatin (CRESTOR) 10 MG tablet    Sig: Take 1 tablet (10 mg total) by mouth daily.    Dispense:  90 tablet    Refill:  1    Order Specific Question:   Supervising Provider    Answer:   MATTHEWS, CODY [[id number]]  . blood glucose meter kit and supplies KIT    Sig: Dispense based on patient and insurance preference. Check glucose before breakfast and before supper. ICD10: 11.42    Dispense:  1 each    Refill:  0    Order Specific Question:   Supervising Provider    Answer:   MATTHEWS, CODY [[id number]]    Order Specific Question:   Number of strips     Answer:   100    Order Specific Question:   Number of lancets    Answer:   100  . glipiZIDE (GLIPIZIDE XL) 2.5 MG 24 hr tablet    Sig: Take 1 tablet (2.5 mg total) by mouth daily with breakfast.    Dispense:  90 tablet    Refill:  1  Discontinue metformin    Order Specific Question:   Supervising Provider    Answer:   MATTHEWS, CODY [[id number]]    Problem List Items Addressed This Visit      Cardiovascular and Mediastinum   Essential hypertension   Relevant Medications   rosuvastatin (CRESTOR) 10 MG tablet   Other Relevant Orders   Basic metabolic panel (Completed)     Endocrine   Type 2 diabetes mellitus with diabetic polyneuropathy, without long-term current use of insulin (HCC) - Primary   Relevant Medications   rosuvastatin (CRESTOR) 10 MG tablet   blood glucose meter kit and supplies KIT   glipiZIDE (GLIPIZIDE XL) 2.5 MG 24 hr tablet   Other Relevant Orders   Hemoglobin A1c (Completed)   Basic metabolic panel (Completed)   POCT Glucose (CBG) (Completed)   Ambulatory referral to Ophthalmology       Follow-up: Return in about 3 months (around 12/13/2018) for DM and HTN, hyperlipidemia (fasting).  Wilfred Lacy, NP

## 2018-09-12 NOTE — Patient Instructions (Addendum)
Go to lab for blood draw.  Start crestor for high cholesterol and to decrease risk of cardiovascular disease.  You will be contacted to schedule appt for diabetic eye exam.  Check glucose twice a day (before breakfast and before supper).

## 2018-09-15 DIAGNOSIS — E11628 Type 2 diabetes mellitus with other skin complications: Secondary | ICD-10-CM | POA: Diagnosis not present

## 2018-09-15 DIAGNOSIS — T86822 Skin graft (allograft) (autograft) infection: Secondary | ICD-10-CM | POA: Diagnosis not present

## 2018-09-15 DIAGNOSIS — L02611 Cutaneous abscess of right foot: Secondary | ICD-10-CM | POA: Diagnosis not present

## 2018-09-15 DIAGNOSIS — B9561 Methicillin susceptible Staphylococcus aureus infection as the cause of diseases classified elsewhere: Secondary | ICD-10-CM | POA: Diagnosis not present

## 2018-09-15 DIAGNOSIS — I358 Other nonrheumatic aortic valve disorders: Secondary | ICD-10-CM | POA: Diagnosis not present

## 2018-09-15 DIAGNOSIS — E1165 Type 2 diabetes mellitus with hyperglycemia: Secondary | ICD-10-CM | POA: Diagnosis not present

## 2018-09-16 DIAGNOSIS — Z4801 Encounter for change or removal of surgical wound dressing: Secondary | ICD-10-CM | POA: Diagnosis not present

## 2018-09-16 DIAGNOSIS — D500 Iron deficiency anemia secondary to blood loss (chronic): Secondary | ICD-10-CM | POA: Diagnosis not present

## 2018-09-16 DIAGNOSIS — T86822 Skin graft (allograft) (autograft) infection: Secondary | ICD-10-CM | POA: Diagnosis not present

## 2018-09-16 DIAGNOSIS — K219 Gastro-esophageal reflux disease without esophagitis: Secondary | ICD-10-CM | POA: Diagnosis not present

## 2018-09-16 DIAGNOSIS — B9561 Methicillin susceptible Staphylococcus aureus infection as the cause of diseases classified elsewhere: Secondary | ICD-10-CM | POA: Diagnosis not present

## 2018-09-16 DIAGNOSIS — Z8543 Personal history of malignant neoplasm of ovary: Secondary | ICD-10-CM | POA: Diagnosis not present

## 2018-09-16 DIAGNOSIS — I10 Essential (primary) hypertension: Secondary | ICD-10-CM | POA: Diagnosis not present

## 2018-09-16 DIAGNOSIS — I358 Other nonrheumatic aortic valve disorders: Secondary | ICD-10-CM | POA: Diagnosis not present

## 2018-09-16 DIAGNOSIS — F0390 Unspecified dementia without behavioral disturbance: Secondary | ICD-10-CM | POA: Diagnosis not present

## 2018-09-16 DIAGNOSIS — L02611 Cutaneous abscess of right foot: Secondary | ICD-10-CM | POA: Diagnosis not present

## 2018-09-16 DIAGNOSIS — Z6834 Body mass index (BMI) 34.0-34.9, adult: Secondary | ICD-10-CM | POA: Diagnosis not present

## 2018-09-16 DIAGNOSIS — F3289 Other specified depressive episodes: Secondary | ICD-10-CM | POA: Diagnosis not present

## 2018-09-16 DIAGNOSIS — E11628 Type 2 diabetes mellitus with other skin complications: Secondary | ICD-10-CM | POA: Diagnosis not present

## 2018-09-16 DIAGNOSIS — E876 Hypokalemia: Secondary | ICD-10-CM | POA: Diagnosis not present

## 2018-09-16 DIAGNOSIS — E1165 Type 2 diabetes mellitus with hyperglycemia: Secondary | ICD-10-CM | POA: Diagnosis not present

## 2018-09-18 ENCOUNTER — Encounter: Payer: Self-pay | Admitting: Nurse Practitioner

## 2018-09-18 DIAGNOSIS — E11628 Type 2 diabetes mellitus with other skin complications: Secondary | ICD-10-CM | POA: Diagnosis not present

## 2018-09-18 DIAGNOSIS — E1165 Type 2 diabetes mellitus with hyperglycemia: Secondary | ICD-10-CM | POA: Diagnosis not present

## 2018-09-18 DIAGNOSIS — I358 Other nonrheumatic aortic valve disorders: Secondary | ICD-10-CM | POA: Diagnosis not present

## 2018-09-18 DIAGNOSIS — L02611 Cutaneous abscess of right foot: Secondary | ICD-10-CM | POA: Diagnosis not present

## 2018-09-18 DIAGNOSIS — T86822 Skin graft (allograft) (autograft) infection: Secondary | ICD-10-CM | POA: Diagnosis not present

## 2018-09-18 DIAGNOSIS — B9561 Methicillin susceptible Staphylococcus aureus infection as the cause of diseases classified elsewhere: Secondary | ICD-10-CM | POA: Diagnosis not present

## 2018-09-22 ENCOUNTER — Other Ambulatory Visit: Payer: Self-pay

## 2018-09-22 ENCOUNTER — Telehealth: Payer: Self-pay | Admitting: Orthopedic Surgery

## 2018-09-22 DIAGNOSIS — B9561 Methicillin susceptible Staphylococcus aureus infection as the cause of diseases classified elsewhere: Secondary | ICD-10-CM | POA: Diagnosis not present

## 2018-09-22 DIAGNOSIS — E11628 Type 2 diabetes mellitus with other skin complications: Secondary | ICD-10-CM | POA: Diagnosis not present

## 2018-09-22 DIAGNOSIS — E1165 Type 2 diabetes mellitus with hyperglycemia: Secondary | ICD-10-CM | POA: Diagnosis not present

## 2018-09-22 DIAGNOSIS — I358 Other nonrheumatic aortic valve disorders: Secondary | ICD-10-CM | POA: Diagnosis not present

## 2018-09-22 DIAGNOSIS — L02611 Cutaneous abscess of right foot: Secondary | ICD-10-CM | POA: Diagnosis not present

## 2018-09-22 DIAGNOSIS — T86822 Skin graft (allograft) (autograft) infection: Secondary | ICD-10-CM | POA: Diagnosis not present

## 2018-09-22 MED ORDER — SILVER SULFADIAZINE 1 % EX CREA: 1.0000 "application " | TOPICAL_CREAM | Freq: Every day | CUTANEOUS | 0 refills | Status: DC

## 2018-09-22 NOTE — Telephone Encounter (Signed)
[name] with [facility] called to request wound orders.  Right now they are doing dry bandage, but it not helping with the healing.  CB#[phone].  Thank you.

## 2018-09-22 NOTE — Telephone Encounter (Signed)
Malachy Mood was called and informed that patient Rx for silvadene was sent in to her pharmacy. Malachy Mood was also notified that silvadene can be used on patient's wound care and she understood orders.

## 2018-09-25 DIAGNOSIS — B9561 Methicillin susceptible Staphylococcus aureus infection as the cause of diseases classified elsewhere: Secondary | ICD-10-CM | POA: Diagnosis not present

## 2018-09-25 DIAGNOSIS — I358 Other nonrheumatic aortic valve disorders: Secondary | ICD-10-CM | POA: Diagnosis not present

## 2018-09-25 DIAGNOSIS — E11628 Type 2 diabetes mellitus with other skin complications: Secondary | ICD-10-CM | POA: Diagnosis not present

## 2018-09-25 DIAGNOSIS — L02611 Cutaneous abscess of right foot: Secondary | ICD-10-CM | POA: Diagnosis not present

## 2018-09-25 DIAGNOSIS — E1165 Type 2 diabetes mellitus with hyperglycemia: Secondary | ICD-10-CM | POA: Diagnosis not present

## 2018-09-25 DIAGNOSIS — T86822 Skin graft (allograft) (autograft) infection: Secondary | ICD-10-CM | POA: Diagnosis not present

## 2018-09-29 ENCOUNTER — Other Ambulatory Visit: Payer: Self-pay

## 2018-09-29 ENCOUNTER — Telehealth: Payer: Self-pay

## 2018-09-29 ENCOUNTER — Ambulatory Visit (INDEPENDENT_AMBULATORY_CARE_PROVIDER_SITE_OTHER): Payer: Medicare Other | Admitting: Orthopedic Surgery

## 2018-09-29 ENCOUNTER — Encounter: Payer: Self-pay | Admitting: Orthopedic Surgery

## 2018-09-29 VITALS — Ht 64.0 in | Wt 218.0 lb

## 2018-09-29 DIAGNOSIS — E1142 Type 2 diabetes mellitus with diabetic polyneuropathy: Secondary | ICD-10-CM

## 2018-09-29 DIAGNOSIS — L97512 Non-pressure chronic ulcer of other part of right foot with fat layer exposed: Secondary | ICD-10-CM

## 2018-09-29 DIAGNOSIS — Z794 Long term (current) use of insulin: Secondary | ICD-10-CM | POA: Diagnosis not present

## 2018-09-29 MED ORDER — SILVER SULFADIAZINE 1 % EX CREA: 1.0000 "application " | TOPICAL_CREAM | Freq: Every day | CUTANEOUS | 3 refills | Status: DC

## 2018-09-29 NOTE — Telephone Encounter (Signed)
Pt requested that order for wound care to be faxed to att: Tyrone Schimke with [facility]. This was faxed today to [phone]

## 2018-09-29 NOTE — Progress Notes (Signed)
Office Visit Note   Patient: [name]           Date of Birth: 1940/12/25           MRN: [id number] Visit Date: 09/29/2018              Requested by: Flossie Buffy, [place],  [place] [address] PCP: Flossie Buffy, NP  Chief Complaint  Patient presents with  . Right Foot - Follow-up    04/25/18 right foot debridement and STSG      HPI: Patient is a 78 year old woman who presents with ulceration lateral aspect of the right foot she is about 5 months status post skin graft to this wound.  Patient states that she has no odor and has been undergoing dry dressing changes with [facility] nursing.  Assessment & Plan: Visit Diagnoses:  1. Right foot ulcer, with fat layer exposed ([place])   2. Type 2 diabetes mellitus with diabetic polyneuropathy, with long-term current use of insulin (HCC)     Plan: A prescription was called in for Silvadene we will start Silvadene dressing changes 3 times a week with [facility] nursing.  Follow-Up Instructions: Return in about 3 weeks (around 10/20/2018).   Ortho Exam  Patient is alert, oriented, no adenopathy, well-dressed, normal affect, normal respiratory effort. Examination patient has a good dorsalis pedis pulse there is a small amount of fibrinous tissue over the granulation tissue the wound measures 4 x 6 cm over the lateral aspect of the right foot there is no exposed bone no exposed tendon no cellulitis no odor no drainage.  We will apply Iodosorb today.  Imaging: No results found. No images are attached to the encounter.  Labs: Lab Results  Component Value Date   HGBA1C 7.4 (H) 09/12/2018   HGBA1C 7.5 (H) 06/27/2018   HGBA1C 13.5 (H) 04/17/2018   ESRSEDRATE 56 (H) 04/20/2018   REPTSTATUS 04/28/2018 FINAL 04/22/2018   GRAMSTAIN  04/22/2018    MODERATE WBC PRESENT, PREDOMINANTLY PMN MODERATE GRAM POSITIVE COCCI RARE GRAM POSITIVE RODS    CULT  04/22/2018   MODERATE GROUP B STREP(S.AGALACTIAE)ISOLATED TESTING AGAINST S. AGALACTIAE NOT ROUTINELY PERFORMED DUE TO PREDICTABILITY OF AMP/PEN/VAN SUSCEPTIBILITY. RARE STAPHYLOCOCCUS AUREUS NO ANAEROBES ISOLATED Performed at [facility], [place] [address], [place], [place] [address]    LABORGA STAPHYLOCOCCUS AUREUS 04/22/2018     Lab Results  Component Value Date   ALBUMIN 3.5 04/20/2018   ALBUMIN 4.0 04/17/2018   ALBUMIN 3.0 (L) 01/30/2015    Lab Results  Component Value Date   MG 1.6 (L) 04/25/2018   MG 1.8 04/24/2018   MG 1.7 04/23/2018   No results found for: VD25OH  No results found for: PREALBUMIN CBC EXTENDED Latest Ref Rng & Units 06/27/2018 04/26/2018 04/25/2018  WBC 3.8 - 10.8 Thousand/uL 5.9 10.8(H) 7.6  RBC 3.80 - 5.10 Million/uL 4.32 4.52 4.66  HGB 11.7 - 15.5 g/dL 11.8 12.6 13.1  HCT 35.0 - 45.0 % 34.9(L) 35.2(L) 36.4  PLT 140 - 400 Thousand/uL 456(H) 487(H) 455(H)  NEUTROABS 1,500 - 7,800 cells/uL 3,404 - 4.8  LYMPHSABS 850 - 3,900 cells/uL 1,505 - 1.6     Body mass index is 37.42 kg/m.  Orders:  No orders of the defined types were placed in this encounter.  Meds ordered this encounter  Medications  . silver sulfADIAZINE (SILVADENE) 1 % cream    Sig: Apply 1 application topically daily. Apply to affected area daily  plus dry dressing    Dispense:  400 g    Refill:  3     Procedures: No procedures performed  Clinical Data: No additional findings.  ROS:  All other systems negative, except as noted in the HPI. Review of Systems  Objective: Vital Signs: Ht 5\' 4"  (1.626 m)   Wt 218 lb (98.9 kg)   BMI 37.42 kg/m   Specialty Comments:  No specialty comments available.  PMFS History: Patient Active Problem List   Diagnosis Date Noted  . 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 Bacteremia due to methicillin susceptible Staphylococcus aureus (MSSA) 04/21/2018  . Diabetic foot infection ([place]) 04/20/2018  . Iron deficiency 04/18/2018  . Foreign  body in right foot 04/18/2018  . Type 2 diabetes mellitus with diabetic polyneuropathy, without long-term current use of insulin ([place]) 04/12/2016  . Vitamin D deficiency 03/06/2016  . 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 Essential hypertension 03/21/2015  . Vision decreased 03/21/2015  . Tension-type headache, not intractable 03/21/2015  . Morbid obesity ([place]) 02/02/2015  . Anemia 01/30/2015   Past Medical History:  Diagnosis Date  . Abdominal distension   . Anemia   . Anxiety   . B12 deficiency   . Balance problem   . Blood in stool   . Cancer (HCC)    OVARIAN  . Colon polyps   . Dementia ([place])    EARLY  . Dementia ([place])    EARLY  . Depression   . GERD (gastroesophageal reflux disease)   . History of blood transfusion   . Hypertension   . Hypomagnesemia   . Urine incontinence     Family History  Family history unknown: Yes    Past Surgical History:  Procedure Laterality Date  . ABDOMINAL HYSTERECTOMY    . APPLICATION OF WOUND VAC  04/25/2018   Procedure: Application Of Wound Vac;  Surgeon: Newt Minion, MD;  Location: [facility];  Service: Orthopedics;;  . CATARACT EXTRACTION W/PHACO Right 07/26/2015   Procedure: CATARACT EXTRACTION PHACO AND INTRAOCULAR LENS PLACEMENT ([place]);  Surgeon: Birder Robson, MD;  Location: [facility];  Service: Ophthalmology;  Laterality: Right;  [name]   1:03.5 AP%  23% CDE   14.59 fluid pack lot # [id number]  . CATARACT EXTRACTION W/PHACO Left 08/29/2015   Procedure: CATARACT EXTRACTION PHACO AND INTRAOCULAR LENS PLACEMENT ([place]);  Surgeon: Birder Robson, MD;  Location: [facility];  Service: Ophthalmology;  Laterality: Left;  [name] 00:40 AP% 20.9 CDE 8.50 fluid pack lot # [id number]  . COLONOSCOPY WITH PROPOFOL N/A 03/14/2015   Procedure: COLONOSCOPY WITH PROPOFOL;  Surgeon: Hulen Luster, MD;  Location[facility];  Service: Gastroenterology;  Laterality: N/A;  . ESOPHAGOGASTRODUODENOSCOPY (EGD) WITH PROPOFOL N/A 03/14/2015   Procedure:  ESOPHAGOGASTRODUODENOSCOPY (EGD) WITH PROPOFOL;  Surgeon: Hulen Luster, MD;  Location: [facility]COPY;  Service: Gastroenterology;  Laterality: N/A;  . I&D EXTREMITY Right 04/22/2018   Procedure: RIGHT FOOT DEBRIDEMENT AND FOREIGN BODY REMOVAL;  Surgeon: Newt Minion, MD;  Location: [facility];  Service: Orthopedics;  Laterality: Right;  . none    . SKIN SPLIT GRAFT Right 04/25/2018   Procedure: REPEAT DEBRIDEMENT RIGHT FOOT, SPLIT THICKNESS SKIN GRAFT, APPLY VAC;  Surgeon: Newt Minion, MD;  Location: [facility];  Service: Orthopedics;  Laterality: Right;  . TOTAL ABDOMINAL HYSTERECTOMY W/ BILATERAL SALPINGOOPHORECTOMY  04/19/2015   exploratory laparoscopy, total abdominal hysterectomy, bilateral salping oophorectomy, left pelvic lymph node dissection, omentectomy   Social History   Occupational History  . Not  on file  Tobacco Use  . Smoking status: Never Smoker  . Smokeless tobacco: Never Used  Substance and Sexual Activity  . Alcohol use: No  . Drug use: No  . Sexual activity: Not Currently

## 2018-09-30 DIAGNOSIS — L02611 Cutaneous abscess of right foot: Secondary | ICD-10-CM | POA: Diagnosis not present

## 2018-09-30 DIAGNOSIS — E1165 Type 2 diabetes mellitus with hyperglycemia: Secondary | ICD-10-CM | POA: Diagnosis not present

## 2018-09-30 DIAGNOSIS — I358 Other nonrheumatic aortic valve disorders: Secondary | ICD-10-CM | POA: Diagnosis not present

## 2018-09-30 DIAGNOSIS — E11628 Type 2 diabetes mellitus with other skin complications: Secondary | ICD-10-CM | POA: Diagnosis not present

## 2018-09-30 DIAGNOSIS — T86822 Skin graft (allograft) (autograft) infection: Secondary | ICD-10-CM | POA: Diagnosis not present

## 2018-09-30 DIAGNOSIS — B9561 Methicillin susceptible Staphylococcus aureus infection as the cause of diseases classified elsewhere: Secondary | ICD-10-CM | POA: Diagnosis not present

## 2018-10-06 DIAGNOSIS — T86822 Skin graft (allograft) (autograft) infection: Secondary | ICD-10-CM | POA: Diagnosis not present

## 2018-10-06 DIAGNOSIS — E11628 Type 2 diabetes mellitus with other skin complications: Secondary | ICD-10-CM | POA: Diagnosis not present

## 2018-10-06 DIAGNOSIS — B9561 Methicillin susceptible Staphylococcus aureus infection as the cause of diseases classified elsewhere: Secondary | ICD-10-CM | POA: Diagnosis not present

## 2018-10-06 DIAGNOSIS — L02611 Cutaneous abscess of right foot: Secondary | ICD-10-CM | POA: Diagnosis not present

## 2018-10-06 DIAGNOSIS — I358 Other nonrheumatic aortic valve disorders: Secondary | ICD-10-CM | POA: Diagnosis not present

## 2018-10-06 DIAGNOSIS — E1165 Type 2 diabetes mellitus with hyperglycemia: Secondary | ICD-10-CM | POA: Diagnosis not present

## 2018-10-07 DIAGNOSIS — E1165 Type 2 diabetes mellitus with hyperglycemia: Secondary | ICD-10-CM | POA: Diagnosis not present

## 2018-10-07 DIAGNOSIS — L02611 Cutaneous abscess of right foot: Secondary | ICD-10-CM | POA: Diagnosis not present

## 2018-10-07 DIAGNOSIS — T86822 Skin graft (allograft) (autograft) infection: Secondary | ICD-10-CM | POA: Diagnosis not present

## 2018-10-07 DIAGNOSIS — E11628 Type 2 diabetes mellitus with other skin complications: Secondary | ICD-10-CM | POA: Diagnosis not present

## 2018-10-07 DIAGNOSIS — I358 Other nonrheumatic aortic valve disorders: Secondary | ICD-10-CM | POA: Diagnosis not present

## 2018-10-07 DIAGNOSIS — B9561 Methicillin susceptible Staphylococcus aureus infection as the cause of diseases classified elsewhere: Secondary | ICD-10-CM | POA: Diagnosis not present

## 2018-10-10 DIAGNOSIS — E11628 Type 2 diabetes mellitus with other skin complications: Secondary | ICD-10-CM | POA: Diagnosis not present

## 2018-10-10 DIAGNOSIS — B9561 Methicillin susceptible Staphylococcus aureus infection as the cause of diseases classified elsewhere: Secondary | ICD-10-CM | POA: Diagnosis not present

## 2018-10-10 DIAGNOSIS — E1165 Type 2 diabetes mellitus with hyperglycemia: Secondary | ICD-10-CM | POA: Diagnosis not present

## 2018-10-10 DIAGNOSIS — L02611 Cutaneous abscess of right foot: Secondary | ICD-10-CM | POA: Diagnosis not present

## 2018-10-10 DIAGNOSIS — T86822 Skin graft (allograft) (autograft) infection: Secondary | ICD-10-CM | POA: Diagnosis not present

## 2018-10-10 DIAGNOSIS — I358 Other nonrheumatic aortic valve disorders: Secondary | ICD-10-CM | POA: Diagnosis not present

## 2018-10-13 ENCOUNTER — Telehealth: Payer: Self-pay | Admitting: Nurse Practitioner

## 2018-10-13 DIAGNOSIS — E1165 Type 2 diabetes mellitus with hyperglycemia: Secondary | ICD-10-CM | POA: Diagnosis not present

## 2018-10-13 DIAGNOSIS — I358 Other nonrheumatic aortic valve disorders: Secondary | ICD-10-CM | POA: Diagnosis not present

## 2018-10-13 DIAGNOSIS — E11628 Type 2 diabetes mellitus with other skin complications: Secondary | ICD-10-CM | POA: Diagnosis not present

## 2018-10-13 DIAGNOSIS — B9561 Methicillin susceptible Staphylococcus aureus infection as the cause of diseases classified elsewhere: Secondary | ICD-10-CM | POA: Diagnosis not present

## 2018-10-13 DIAGNOSIS — T86822 Skin graft (allograft) (autograft) infection: Secondary | ICD-10-CM | POA: Diagnosis not present

## 2018-10-13 DIAGNOSIS — L02611 Cutaneous abscess of right foot: Secondary | ICD-10-CM | POA: Diagnosis not present

## 2018-10-13 NOTE — Telephone Encounter (Signed)
Spoke with Dyann Ruddle, she stated that Mrs. Meldrum BP first one was 150/98 and after pt took her med it was 138/90. Dyann Ruddle stated this is her first time see the pt and pt doesn't seem to trust or want any advise from the nurse beside let her work on wound care. She also mention that pt doesn't have anymore refill left on lisinopril 5mg  (last refill was 06/27/2018--with no refill left). Please advise.

## 2018-10-13 NOTE — Telephone Encounter (Signed)
Please have patient contact her pharmacy for refills. She should have enough refills till 03/2019. Also ensure that she is checking glucose. Also schedule her f/up for November (F2F)

## 2018-10-13 NOTE — Telephone Encounter (Signed)
Per [name]) pt is slightly Hypertensive today.  Elieen would also like to report that pt is no longer taking: lisinopril (PRINIVIL,ZESTRIL) 5 MG tablet and has no refills for it.  Dyann Ruddle states pt was a bit argumentative when questioned about her meds and did not seem to trust [place] much. Please advise if necessary:  Dyann Ruddle:  [phone]

## 2018-10-14 NOTE — Telephone Encounter (Signed)
LVM for the pt to call back.

## 2018-10-15 ENCOUNTER — Telehealth: Payer: Self-pay | Admitting: Nurse Practitioner

## 2018-10-15 DIAGNOSIS — B9561 Methicillin susceptible Staphylococcus aureus infection as the cause of diseases classified elsewhere: Secondary | ICD-10-CM | POA: Diagnosis not present

## 2018-10-15 DIAGNOSIS — L02611 Cutaneous abscess of right foot: Secondary | ICD-10-CM | POA: Diagnosis not present

## 2018-10-15 DIAGNOSIS — E11628 Type 2 diabetes mellitus with other skin complications: Secondary | ICD-10-CM | POA: Diagnosis not present

## 2018-10-15 DIAGNOSIS — T86822 Skin graft (allograft) (autograft) infection: Secondary | ICD-10-CM | POA: Diagnosis not present

## 2018-10-15 DIAGNOSIS — I358 Other nonrheumatic aortic valve disorders: Secondary | ICD-10-CM | POA: Diagnosis not present

## 2018-10-15 DIAGNOSIS — E1165 Type 2 diabetes mellitus with hyperglycemia: Secondary | ICD-10-CM | POA: Diagnosis not present

## 2018-10-15 NOTE — Telephone Encounter (Signed)
Unable to leave vm, need to know what is [facility] going to be managing? What are they checking and how often are they doing for BP and DM.

## 2018-10-15 NOTE — Telephone Encounter (Signed)
°  [name], with [facility], calling to request recert of care orders.   She is requesting continued wound care for foot and cardiac and diabetes monitoring.

## 2018-10-15 NOTE — Telephone Encounter (Signed)
Please advise 

## 2018-10-15 NOTE — Telephone Encounter (Signed)
Wound care orders need to come from podiatry. What does their cardiac and diabetic monitoring protocol entail?

## 2018-10-16 DIAGNOSIS — I358 Other nonrheumatic aortic valve disorders: Secondary | ICD-10-CM | POA: Diagnosis not present

## 2018-10-16 DIAGNOSIS — I10 Essential (primary) hypertension: Secondary | ICD-10-CM | POA: Diagnosis not present

## 2018-10-16 DIAGNOSIS — Z4801 Encounter for change or removal of surgical wound dressing: Secondary | ICD-10-CM | POA: Diagnosis not present

## 2018-10-16 DIAGNOSIS — K219 Gastro-esophageal reflux disease without esophagitis: Secondary | ICD-10-CM | POA: Diagnosis not present

## 2018-10-16 DIAGNOSIS — D500 Iron deficiency anemia secondary to blood loss (chronic): Secondary | ICD-10-CM | POA: Diagnosis not present

## 2018-10-16 DIAGNOSIS — E1165 Type 2 diabetes mellitus with hyperglycemia: Secondary | ICD-10-CM | POA: Diagnosis not present

## 2018-10-16 DIAGNOSIS — Z8543 Personal history of malignant neoplasm of ovary: Secondary | ICD-10-CM | POA: Diagnosis not present

## 2018-10-16 DIAGNOSIS — E876 Hypokalemia: Secondary | ICD-10-CM | POA: Diagnosis not present

## 2018-10-16 DIAGNOSIS — F3289 Other specified depressive episodes: Secondary | ICD-10-CM | POA: Diagnosis not present

## 2018-10-16 DIAGNOSIS — L02611 Cutaneous abscess of right foot: Secondary | ICD-10-CM | POA: Diagnosis not present

## 2018-10-16 DIAGNOSIS — Z6834 Body mass index (BMI) 34.0-34.9, adult: Secondary | ICD-10-CM | POA: Diagnosis not present

## 2018-10-16 DIAGNOSIS — F0390 Unspecified dementia without behavioral disturbance: Secondary | ICD-10-CM | POA: Diagnosis not present

## 2018-10-16 DIAGNOSIS — T86822 Skin graft (allograft) (autograft) infection: Secondary | ICD-10-CM | POA: Diagnosis not present

## 2018-10-16 DIAGNOSIS — B9561 Methicillin susceptible Staphylococcus aureus infection as the cause of diseases classified elsewhere: Secondary | ICD-10-CM | POA: Diagnosis not present

## 2018-10-16 DIAGNOSIS — E11628 Type 2 diabetes mellitus with other skin complications: Secondary | ICD-10-CM | POA: Diagnosis not present

## 2018-10-16 NOTE — Telephone Encounter (Signed)
LVM for the pt to call back.

## 2018-10-16 NOTE — Telephone Encounter (Signed)
Spoke with Illene, advised her that wound need to come from podiatry--she is aware to call them.   She also request continued care for [facility] to monitor BP and DM for the pt for 9 wks. Going out to see the pt twice a week to check pt's wound,get vital sign, blood sugar check, assess skin condition, provider educations and etc. (not sure if this part already come with when they go do wound care for the pt already).

## 2018-10-17 ENCOUNTER — Telehealth: Payer: Self-pay

## 2018-10-17 NOTE — Telephone Encounter (Signed)
[name] with [facility] stated that she called and left a VM this morning, but no message was in patient's chart.  Would like to continue wound care for 9 weeks for wound care and would like order renewed for Home Health for patient?  Cb# is [phone].  Please advise.  Thank you.

## 2018-10-17 NOTE — Telephone Encounter (Signed)
Ok to continue

## 2018-10-20 ENCOUNTER — Telehealth: Payer: Self-pay | Admitting: Orthopedic Surgery

## 2018-10-20 NOTE — Telephone Encounter (Signed)
Duplicate message. 

## 2018-10-20 NOTE — Telephone Encounter (Signed)
I called and sw HHN to advise ok for extension of orders for the 9 weeks requested and to continue with silvadened dressing changes three times a week.

## 2018-10-20 NOTE — Telephone Encounter (Signed)
Ilene from [facility] left a message in regards to the patient's home health.  She is also needing orders for wound care.  CB#[phone].  Thank you.

## 2018-10-21 DIAGNOSIS — T86822 Skin graft (allograft) (autograft) infection: Secondary | ICD-10-CM | POA: Diagnosis not present

## 2018-10-21 DIAGNOSIS — I358 Other nonrheumatic aortic valve disorders: Secondary | ICD-10-CM | POA: Diagnosis not present

## 2018-10-21 DIAGNOSIS — L02611 Cutaneous abscess of right foot: Secondary | ICD-10-CM | POA: Diagnosis not present

## 2018-10-21 DIAGNOSIS — E1165 Type 2 diabetes mellitus with hyperglycemia: Secondary | ICD-10-CM | POA: Diagnosis not present

## 2018-10-21 DIAGNOSIS — E11628 Type 2 diabetes mellitus with other skin complications: Secondary | ICD-10-CM | POA: Diagnosis not present

## 2018-10-21 DIAGNOSIS — B9561 Methicillin susceptible Staphylococcus aureus infection as the cause of diseases classified elsewhere: Secondary | ICD-10-CM | POA: Diagnosis not present

## 2018-10-21 NOTE — Telephone Encounter (Signed)
LVM for the nurse to call back, need to inform charlotte response below.

## 2018-10-22 NOTE — Telephone Encounter (Signed)
Left another vm for the nurse to call back.

## 2018-10-23 ENCOUNTER — Encounter: Payer: Self-pay | Admitting: Orthopedic Surgery

## 2018-10-23 ENCOUNTER — Ambulatory Visit (INDEPENDENT_AMBULATORY_CARE_PROVIDER_SITE_OTHER): Payer: Medicare Other | Admitting: Orthopedic Surgery

## 2018-10-23 VITALS — Ht 64.0 in | Wt 218.0 lb

## 2018-10-23 DIAGNOSIS — L97512 Non-pressure chronic ulcer of other part of right foot with fat layer exposed: Secondary | ICD-10-CM

## 2018-10-23 NOTE — Progress Notes (Signed)
Office Visit Note   Patient: [name]           Date of Birth: January 16, 1941           MRN: [id number] Visit Date: 10/23/2018              Requested by: Flossie Buffy, [place] [place],  [place] [address] PCP: Flossie Buffy, NP  Chief Complaint  Patient presents with  . Right Foot - Follow-up      HPI: Patient is a 78 year old woman who presents 6 months status post split-thickness skin graft right foot she currently has [place] with Silvadene dressing changes.  Assessment & Plan: Visit Diagnoses:  1. Right foot ulcer, with fat layer exposed ([place])     Plan: Continue with 3 times a week home health nursing dressing changes.  Follow-Up Instructions: Return in about 4 weeks (around 11/20/2018).   Ortho Exam  Patient is alert, oriented, no adenopathy, well-dressed, normal affect, normal respiratory effort. Examination patient's wound has 100% granulation tissue with a very thin layer of fibrinous exudative tissue there is no redness no cellulitis no signs of infection no exposed bone or tendon the ulcer is 4 x 4 6 cm.  Patient has a palpable dorsalis pedis pulse  Imaging: No results found. No images are attached to the encounter.  Labs: Lab Results  Component Value Date   HGBA1C 7.4 (H) 09/12/2018   HGBA1C 7.5 (H) 06/27/2018   HGBA1C 13.5 (H) 04/17/2018   ESRSEDRATE 56 (H) 04/20/2018   REPTSTATUS 04/28/2018 FINAL 04/22/2018   GRAMSTAIN  04/22/2018    MODERATE WBC PRESENT, PREDOMINANTLY PMN MODERATE GRAM POSITIVE COCCI RARE GRAM POSITIVE RODS    CULT  04/22/2018    MODERATE GROUP B STREP(S.AGALACTIAE)ISOLATED TESTING AGAINST S. AGALACTIAE NOT ROUTINELY PERFORMED DUE TO PREDICTABILITY OF AMP/PEN/VAN SUSCEPTIBILITY. RARE STAPHYLOCOCCUS AUREUS NO ANAEROBES ISOLATED Performed at [facility], [place] [address], [place],  [address]    LABORGA STAPHYLOCOCCUS AUREUS 04/22/2018     Lab Results   Component Value Date   ALBUMIN 3.5 04/20/2018   ALBUMIN 4.0 04/17/2018   ALBUMIN 3.0 (L) 01/30/2015    Lab Results  Component Value Date   MG 1.6 (L) 04/25/2018   MG 1.8 04/24/2018   MG 1.7 04/23/2018   No results found for: VD25OH  No results found for: PREALBUMIN CBC EXTENDED Latest Ref Rng & Units 06/27/2018 04/26/2018 04/25/2018  WBC 3.8 - 10.8 Thousand/uL 5.9 10.8(H) 7.6  RBC 3.80 - 5.10 Million/uL 4.32 4.52 4.66  HGB 11.7 - 15.5 g/dL 11.8 12.6 13.1  HCT 35.0 - 45.0 % 34.9(L) 35.2(L) 36.4  PLT 140 - 400 Thousand/uL 456(H) 487(H) 455(H)  NEUTROABS 1,500 - 7,800 cells/uL 3,404 - 4.8  LYMPHSABS 850 - 3,900 cells/uL 1,505 - 1.6     Body mass index is 37.42 kg/m.  Orders:  No orders of the defined types were placed in this encounter.  No orders of the defined types were placed in this encounter.    Procedures: No procedures performed  Clinical Data: No additional findings.  ROS:  All other systems negative, except as noted in the HPI. Review of Systems  Objective: Vital Signs: Ht 5\' 4"  (1.626 m)   Wt 218 lb (98.9 kg)   BMI 37.42 kg/m   Specialty Comments:  No specialty comments available.  PMFS History: Patient Active Problem List   Diagnosis Date Noted  . 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 Bacteremia  due to methicillin susceptible Staphylococcus aureus (MSSA) 04/21/2018  . Diabetic foot infection ([place]) 04/20/2018  . Iron deficiency 04/18/2018  . Foreign body in right foot 04/18/2018  . Type 2 diabetes mellitus with diabetic polyneuropathy, without long-term current use of insulin ([place]) 04/12/2016  . Vitamin D deficiency 03/06/2016  . 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 Essential hypertension 03/21/2015  . Vision decreased 03/21/2015  . Tension-type headache, not intractable 03/21/2015  . Morbid obesity ([place]) 02/02/2015  . Anemia 01/30/2015   Past Medical History:  Diagnosis Date  . Abdominal distension   . Anemia   . Anxiety   . B12  deficiency   . Balance problem   . Blood in stool   . Cancer (HCC)    OVARIAN  . Colon polyps   . Dementia ([place])    EARLY  . Dementia ([place])    EARLY  . Depression   . GERD (gastroesophageal reflux disease)   . History of blood transfusion   . Hypertension   . Hypomagnesemia   . Urine incontinence     Family History  Family history unknown: Yes    Past Surgical History:  Procedure Laterality Date  . ABDOMINAL HYSTERECTOMY    . APPLICATION OF WOUND VAC  04/25/2018   Procedure: Application Of Wound Vac;  Surgeon: Newt Minion, MD;  Location: [place];  Service: Orthopedics;;  . CATARACT EXTRACTION W/PHACO Right 07/26/2015   Procedure: CATARACT EXTRACTION PHACO AND INTRAOCULAR LENS PLACEMENT ([place]);  Surgeon: Birder Robson, MD;  Location: [facility];  Service: Ophthalmology;  Laterality: Right;  [name]   1:03.5 AP%  23% CDE   14.59 fluid pack lot # [id number]  . CATARACT EXTRACTION W/PHACO Left 08/29/2015   Procedure: CATARACT EXTRACTION PHACO AND INTRAOCULAR LENS PLACEMENT ([place]);  Surgeon: Birder Robson, MD;  Location: [facility];  Service: Ophthalmology;  Laterality: Left;  [name] 00:40 AP% 20.9 CDE 8.50 fluid pack lot # [id number]  . COLONOSCOPY WITH PROPOFOL N/A 03/14/2015   Procedure: COLONOSCOPY WITH PROPOFOL;  Surgeon: Hulen Luster, MD;  Location: [facility] END[facility]COPY;  Service: Gastroenterology;  Laterality: N/A;  . ESOPHAGOGASTRODUODENOSCOPY (EGD) WITH PROPOFOL N/A 03/14/2015   Procedure: ESOPHAGOGASTRODUODENOSCOPY (EGD) WITH PROPOFOL;  Surgeon: Hulen Luster, MD;  Location[facility];  Service: Gastroenterology;  Laterality: N/A;  . I&D EXTREMITY Right 04/22/2018   Procedure: RIGHT FOOT DEBRIDEMENT AND FOREIGN BODY REMOVAL;  Surgeon: Newt Minion, MD;  Location: [place];  Service: Orthopedics;  Laterality: Right;  . none    . SKIN SPLIT GRAFT Right 04/25/2018   Procedure: REPEAT DEBRIDEMENT RIGHT FOOT, SPLIT THICKNESS SKIN GRAFT, APPLY VAC;  Surgeon: Newt Minion, MD;  Location: [place];   Service: Orthopedics;  Laterality: Right;  . TOTAL ABDOMINAL HYSTERECTOMY W/ BILATERAL SALPINGOOPHORECTOMY  04/19/2015   exploratory laparoscopy, total abdominal hysterectomy, bilateral salping oophorectomy, left pelvic lymph node dissection, omentectomy   Social History   Occupational History  . Not on file  Tobacco Use  . Smoking status: Never Smoker  . Smokeless tobacco: Never Used  Substance and Sexual Activity  . Alcohol use: No  . Drug use: No  . Sexual activity: Not Currently

## 2018-10-24 DIAGNOSIS — B9561 Methicillin susceptible Staphylococcus aureus infection as the cause of diseases classified elsewhere: Secondary | ICD-10-CM | POA: Diagnosis not present

## 2018-10-24 DIAGNOSIS — I358 Other nonrheumatic aortic valve disorders: Secondary | ICD-10-CM | POA: Diagnosis not present

## 2018-10-24 DIAGNOSIS — L02611 Cutaneous abscess of right foot: Secondary | ICD-10-CM | POA: Diagnosis not present

## 2018-10-24 DIAGNOSIS — E11628 Type 2 diabetes mellitus with other skin complications: Secondary | ICD-10-CM | POA: Diagnosis not present

## 2018-10-24 DIAGNOSIS — T86822 Skin graft (allograft) (autograft) infection: Secondary | ICD-10-CM | POA: Diagnosis not present

## 2018-10-24 DIAGNOSIS — E1165 Type 2 diabetes mellitus with hyperglycemia: Secondary | ICD-10-CM | POA: Diagnosis not present

## 2018-10-28 ENCOUNTER — Telehealth: Payer: Self-pay | Admitting: Nurse Practitioner

## 2018-10-28 NOTE — Telephone Encounter (Signed)
FYI

## 2018-10-28 NOTE — Telephone Encounter (Signed)
[name] calling with [facility] states that pt will not answer or come to the door and had a missed visit today.    [phone]

## 2018-10-28 NOTE — Telephone Encounter (Signed)
Can you please try to reach patient?

## 2018-10-29 NOTE — Telephone Encounter (Signed)
LVM for the pt to call back.

## 2018-10-30 DIAGNOSIS — E11628 Type 2 diabetes mellitus with other skin complications: Secondary | ICD-10-CM | POA: Diagnosis not present

## 2018-10-30 DIAGNOSIS — T86822 Skin graft (allograft) (autograft) infection: Secondary | ICD-10-CM | POA: Diagnosis not present

## 2018-10-30 DIAGNOSIS — I358 Other nonrheumatic aortic valve disorders: Secondary | ICD-10-CM | POA: Diagnosis not present

## 2018-10-30 DIAGNOSIS — L02611 Cutaneous abscess of right foot: Secondary | ICD-10-CM | POA: Diagnosis not present

## 2018-10-30 DIAGNOSIS — E1165 Type 2 diabetes mellitus with hyperglycemia: Secondary | ICD-10-CM | POA: Diagnosis not present

## 2018-10-30 DIAGNOSIS — B9561 Methicillin susceptible Staphylococcus aureus infection as the cause of diseases classified elsewhere: Secondary | ICD-10-CM | POA: Diagnosis not present

## 2018-10-30 NOTE — Telephone Encounter (Signed)
Spoke with Mr. [name] after tried multiple times to call [name] with no success. Gave him information to relay to [name] to call [facility] nurse below.

## 2018-10-30 NOTE — Telephone Encounter (Signed)
Left another message for the pt to call back.  

## 2018-11-04 DIAGNOSIS — T86822 Skin graft (allograft) (autograft) infection: Secondary | ICD-10-CM | POA: Diagnosis not present

## 2018-11-04 DIAGNOSIS — I358 Other nonrheumatic aortic valve disorders: Secondary | ICD-10-CM | POA: Diagnosis not present

## 2018-11-04 DIAGNOSIS — E11628 Type 2 diabetes mellitus with other skin complications: Secondary | ICD-10-CM | POA: Diagnosis not present

## 2018-11-04 DIAGNOSIS — E1165 Type 2 diabetes mellitus with hyperglycemia: Secondary | ICD-10-CM | POA: Diagnosis not present

## 2018-11-04 DIAGNOSIS — B9561 Methicillin susceptible Staphylococcus aureus infection as the cause of diseases classified elsewhere: Secondary | ICD-10-CM | POA: Diagnosis not present

## 2018-11-04 DIAGNOSIS — L02611 Cutaneous abscess of right foot: Secondary | ICD-10-CM | POA: Diagnosis not present

## 2018-11-05 NOTE — Telephone Encounter (Signed)
Error

## 2018-11-07 ENCOUNTER — Telehealth: Payer: Self-pay | Admitting: *Deleted

## 2018-11-07 NOTE — Telephone Encounter (Signed)
[name] with [facility] called stating pt is supposed to be d/c today for wound care, but states the wound is actually looking worse and feels the pt is not ready for D/C and would like to recertify for wound care home health and needs new order to see pt 2x/3 weeks.   Please call [name] at [phone]

## 2018-11-07 NOTE — Telephone Encounter (Signed)
[facility] was called and lvm for patients orders, verbal okay was given.

## 2018-11-15 DIAGNOSIS — E11628 Type 2 diabetes mellitus with other skin complications: Secondary | ICD-10-CM | POA: Diagnosis not present

## 2018-11-15 DIAGNOSIS — K219 Gastro-esophageal reflux disease without esophagitis: Secondary | ICD-10-CM | POA: Diagnosis not present

## 2018-11-15 DIAGNOSIS — I10 Essential (primary) hypertension: Secondary | ICD-10-CM | POA: Diagnosis not present

## 2018-11-15 DIAGNOSIS — Z8543 Personal history of malignant neoplasm of ovary: Secondary | ICD-10-CM | POA: Diagnosis not present

## 2018-11-15 DIAGNOSIS — F3289 Other specified depressive episodes: Secondary | ICD-10-CM | POA: Diagnosis not present

## 2018-11-15 DIAGNOSIS — E876 Hypokalemia: Secondary | ICD-10-CM | POA: Diagnosis not present

## 2018-11-15 DIAGNOSIS — Z6834 Body mass index (BMI) 34.0-34.9, adult: Secondary | ICD-10-CM | POA: Diagnosis not present

## 2018-11-15 DIAGNOSIS — Z4801 Encounter for change or removal of surgical wound dressing: Secondary | ICD-10-CM | POA: Diagnosis not present

## 2018-11-15 DIAGNOSIS — F0390 Unspecified dementia without behavioral disturbance: Secondary | ICD-10-CM | POA: Diagnosis not present

## 2018-11-15 DIAGNOSIS — B9561 Methicillin susceptible Staphylococcus aureus infection as the cause of diseases classified elsewhere: Secondary | ICD-10-CM | POA: Diagnosis not present

## 2018-11-15 DIAGNOSIS — D500 Iron deficiency anemia secondary to blood loss (chronic): Secondary | ICD-10-CM | POA: Diagnosis not present

## 2018-11-15 DIAGNOSIS — T86822 Skin graft (allograft) (autograft) infection: Secondary | ICD-10-CM | POA: Diagnosis not present

## 2018-11-15 DIAGNOSIS — I358 Other nonrheumatic aortic valve disorders: Secondary | ICD-10-CM | POA: Diagnosis not present

## 2018-11-15 DIAGNOSIS — E1165 Type 2 diabetes mellitus with hyperglycemia: Secondary | ICD-10-CM | POA: Diagnosis not present

## 2018-11-15 DIAGNOSIS — L02611 Cutaneous abscess of right foot: Secondary | ICD-10-CM | POA: Diagnosis not present

## 2018-11-24 ENCOUNTER — Ambulatory Visit: Payer: Medicare Other | Admitting: Orthopedic Surgery

## 2018-11-26 ENCOUNTER — Ambulatory Visit: Payer: Medicare Other | Admitting: Family

## 2018-11-28 ENCOUNTER — Other Ambulatory Visit: Payer: Self-pay

## 2018-11-28 ENCOUNTER — Encounter: Payer: Self-pay | Admitting: Family

## 2018-11-28 ENCOUNTER — Ambulatory Visit (INDEPENDENT_AMBULATORY_CARE_PROVIDER_SITE_OTHER): Payer: Medicare Other | Admitting: Family

## 2018-11-28 VITALS — Ht 64.0 in | Wt 218.0 lb

## 2018-11-28 DIAGNOSIS — Z794 Long term (current) use of insulin: Secondary | ICD-10-CM

## 2018-11-28 DIAGNOSIS — Z945 Skin transplant status: Secondary | ICD-10-CM

## 2018-11-28 DIAGNOSIS — L97512 Non-pressure chronic ulcer of other part of right foot with fat layer exposed: Secondary | ICD-10-CM

## 2018-11-28 DIAGNOSIS — E1142 Type 2 diabetes mellitus with diabetic polyneuropathy: Secondary | ICD-10-CM | POA: Diagnosis not present

## 2018-12-03 ENCOUNTER — Encounter: Payer: Self-pay | Admitting: Family

## 2018-12-03 NOTE — Progress Notes (Signed)
Office Visit Note   Patient: [name]           Date of Birth: 02-17-1940           MRN: [id number] Visit Date: 11/28/2018              Requested by: Flossie Buffy, NP [address] [place],  [place] [address] PCP: Flossie Buffy, NP  Chief Complaint  Patient presents with  . Right Foot - Follow-up    04/25/18 right foot debridement STSG      HPI: Patient is a 78 year old woman who presents status post split-thickness skin graft right foot. she currently has [place] with Silvadene dressing changes every other day.  reports a small amount of pink drainage.  Assessment & Plan: Visit Diagnoses:  1. S/P split thickness skin graft   2. Type 2 diabetes mellitus with diabetic polyneuropathy, with long-term current use of insulin ([place])   3. Right foot ulcer, with fat layer exposed ([place])     Plan: Continue with 3 times a week home health nursing silvadene dressing changes.  Follow-Up Instructions: No follow-ups on file.   Ortho Exam  Patient is alert, oriented, no adenopathy, well-dressed, normal affect, normal respiratory effort. Examination patient's wound has 100% granulation tissue with a very thin layer of fibrinous exudative tissue. Slight maceration surrounding. there is no redness no cellulitis no signs of infection no exposed bone or tendon. Patient has a palpable dorsalis pedis pulse  Imaging: No results found. No images are attached to the encounter.  Labs: Lab Results  Component Value Date   HGBA1C 7.4 (H) 09/12/2018   HGBA1C 7.5 (H) 06/27/2018   HGBA1C 13.5 (H) 04/17/2018   ESRSEDRATE 56 (H) 04/20/2018   REPTSTATUS 04/28/2018 FINAL 04/22/2018   GRAMSTAIN  04/22/2018    MODERATE WBC PRESENT, PREDOMINANTLY PMN MODERATE GRAM POSITIVE COCCI RARE GRAM POSITIVE RODS    CULT  04/22/2018    MODERATE GROUP B STREP(S.AGALACTIAE)ISOLATED TESTING AGAINST S. AGALACTIAE NOT ROUTINELY PERFORMED DUE TO PREDICTABILITY  OF AMP/PEN/VAN SUSCEPTIBILITY. RARE STAPHYLOCOCCUS AUREUS NO ANAEROBES ISOLATED Performed at [facility], [place] [address] [place], [place] [address]    LABORGA STAPHYLOCOCCUS AUREUS 04/22/2018     Lab Results  Component Value Date   ALBUMIN 3.5 04/20/2018   ALBUMIN 4.0 04/17/2018   ALBUMIN 3.0 (L) 01/30/2015    Lab Results  Component Value Date   MG 1.6 (L) 04/25/2018   MG 1.8 04/24/2018   MG 1.7 04/23/2018   No results found for: VD25OH  No results found for: PREALBUMIN CBC EXTENDED Latest Ref Rng & Units 06/27/2018 04/26/2018 04/25/2018  WBC 3.8 - 10.8 Thousand/uL 5.9 10.8(H) 7.6  RBC 3.80 - 5.10 Million/uL 4.32 4.52 4.66  HGB 11.7 - 15.5 g/dL 11.8 12.6 13.1  HCT 35.0 - 45.0 % 34.9(L) 35.2(L) 36.4  PLT 140 - 400 Thousand/uL 456(H) 487(H) 455(H)  NEUTROABS 1,500 - 7,800 cells/uL 3,404 - 4.8  LYMPHSABS 850 - 3,900 cells/uL 1,505 - 1.6     Body mass index is 37.42 kg/m.  Orders:  No orders of the defined types were placed in this encounter.  No orders of the defined types were placed in this encounter.    Procedures: No procedures performed  Clinical Data: No additional findings.  ROS:  All other systems negative, except as noted in the HPI. Review of Systems  Constitutional: Negative for chills and fever.  Cardiovascular: Positive for leg swelling.  Skin: Positive for  wound. Negative for color change.    Objective: Vital Signs: Ht 5\' 4"  (1.626 m)   Wt 218 lb (98.9 kg)   BMI 37.42 kg/m   Specialty Comments:  No specialty comments available.  PMFS History: Patient Active Problem List   Diagnosis Date Noted  . 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 Bacteremia due to methicillin susceptible Staphylococcus aureus (MSSA) 04/21/2018  . Diabetic foot infection ([place]) 04/20/2018  . Iron deficiency 04/18/2018  . Foreign body in right foot 04/18/2018  . Type 2 diabetes mellitus with diabetic polyneuropathy, without long-term current use of insulin  ([place]) 04/12/2016  . Vitamin D deficiency 03/06/2016  . 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 Essential hypertension 03/21/2015  . Vision decreased 03/21/2015  . Tension-type headache, not intractable 03/21/2015  . Morbid obesity ([place]) 02/02/2015  . Anemia 01/30/2015   Past Medical History:  Diagnosis Date  . Abdominal distension   . Anemia   . Anxiety   . B12 deficiency   . Balance problem   . Blood in stool   . Cancer (HCC)    OVARIAN  . Colon polyps   . Dementia ([place])    EARLY  . Dementia ([place])    EARLY  . Depression   . GERD (gastroesophageal reflux disease)   . History of blood transfusion   . Hypertension   . Hypomagnesemia   . Urine incontinence     Family History  Family history unknown: Yes    Past Surgical History:  Procedure Laterality Date  . ABDOMINAL HYSTERECTOMY    . APPLICATION OF WOUND VAC  04/25/2018   Procedure: Application Of Wound Vac;  Surgeon: Newt Minion, MD;  Location: [place];  Service: Orthopedics;;  . CATARACT EXTRACTION W/PHACO Right 07/26/2015   Procedure: CATARACT EXTRACTION PHACO AND INTRAOCULAR LENS PLACEMENT ([place]);  Surgeon: Birder Robson, MD;  Location: [facility];  Service: Ophthalmology;  Laterality: Right;  [name]   1:03.5 AP%  23% CDE   14.59 fluid pack lot # [id number]  . CATARACT EXTRACTION W/PHACO Left 08/29/2015   Procedure: CATARACT EXTRACTION PHACO AND INTRAOCULAR LENS PLACEMENT ([place]);  Surgeon: Birder Robson, MD;  Location: [facility];  Service: Ophthalmology;  Laterality: Left;  [name] 00:40 AP% 20.9 CDE 8.50 fluid pack lot # [id number]  . COLONOSCOPY WITH PROPOFOL N/A 03/14/2015   Procedure: COLONOSCOPY WITH PROPOFOL;  Surgeon: Hulen Luster, MD;  Location: [facility]COPY;  Service: Gastroenterology;  Laterality: N/A;  . ESOPHAGOGASTRODUODENOSCOPY (EGD) WITH PROPOFOL N/A 03/14/2015   Procedure: ESOPHAGOGASTRODUODENOSCOPY (EGD) WITH PROPOFOL;  Surgeon: Hulen Luster, MD;  Location: [facility]COPY;  Service: Gastroenterology;  Laterality: N/A;   . I&D EXTREMITY Right 04/22/2018   Procedure: RIGHT FOOT DEBRIDEMENT AND FOREIGN BODY REMOVAL;  Surgeon: Newt Minion, MD;  Location: [place];  Service: Orthopedics;  Laterality: Right;  . none    . SKIN SPLIT GRAFT Right 04/25/2018   Procedure: REPEAT DEBRIDEMENT RIGHT FOOT, SPLIT THICKNESS SKIN GRAFT, APPLY VAC;  Surgeon: Newt Minion, MD;  Location: [place];  Service: Orthopedics;  Laterality: Right;  . TOTAL ABDOMINAL HYSTERECTOMY W/ BILATERAL SALPINGOOPHORECTOMY  04/19/2015   exploratory laparoscopy, total abdominal hysterectomy, bilateral salping oophorectomy, left pelvic lymph node dissection, omentectomy   Social History   Occupational History  . Not on file  Tobacco Use  . Smoking status: Never Smoker  . Smokeless tobacco: Never Used  Substance and Sexual Activity  . Alcohol use: No  . Drug use: No  . Sexual activity: Not Currently

## 2018-12-05 ENCOUNTER — Telehealth: Payer: Self-pay | Admitting: Radiology

## 2018-12-05 NOTE — Telephone Encounter (Signed)
[name] with [facility] called requesting verbal orders for home health physical therapy and nursing resumption. She states patient had refused services on several occasions but now wants to resume. I gave verbal orders and advised per last office note, Dr. Sharol Given wanted to continue silvadene dressing changes 3x week.

## 2018-12-05 NOTE — Telephone Encounter (Signed)
Ok, thank you

## 2018-12-11 DIAGNOSIS — E1165 Type 2 diabetes mellitus with hyperglycemia: Secondary | ICD-10-CM | POA: Diagnosis not present

## 2018-12-11 DIAGNOSIS — E11628 Type 2 diabetes mellitus with other skin complications: Secondary | ICD-10-CM | POA: Diagnosis not present

## 2018-12-11 DIAGNOSIS — B9561 Methicillin susceptible Staphylococcus aureus infection as the cause of diseases classified elsewhere: Secondary | ICD-10-CM | POA: Diagnosis not present

## 2018-12-11 DIAGNOSIS — T86822 Skin graft (allograft) (autograft) infection: Secondary | ICD-10-CM | POA: Diagnosis not present

## 2018-12-11 DIAGNOSIS — L02611 Cutaneous abscess of right foot: Secondary | ICD-10-CM | POA: Diagnosis not present

## 2018-12-11 DIAGNOSIS — I358 Other nonrheumatic aortic valve disorders: Secondary | ICD-10-CM | POA: Diagnosis not present

## 2018-12-12 ENCOUNTER — Telehealth: Payer: Self-pay | Admitting: Radiology

## 2018-12-12 DIAGNOSIS — T86822 Skin graft (allograft) (autograft) infection: Secondary | ICD-10-CM | POA: Diagnosis not present

## 2018-12-12 DIAGNOSIS — I358 Other nonrheumatic aortic valve disorders: Secondary | ICD-10-CM | POA: Diagnosis not present

## 2018-12-12 DIAGNOSIS — B9561 Methicillin susceptible Staphylococcus aureus infection as the cause of diseases classified elsewhere: Secondary | ICD-10-CM | POA: Diagnosis not present

## 2018-12-12 DIAGNOSIS — E11628 Type 2 diabetes mellitus with other skin complications: Secondary | ICD-10-CM | POA: Diagnosis not present

## 2018-12-12 DIAGNOSIS — E1165 Type 2 diabetes mellitus with hyperglycemia: Secondary | ICD-10-CM | POA: Diagnosis not present

## 2018-12-12 DIAGNOSIS — L02611 Cutaneous abscess of right foot: Secondary | ICD-10-CM | POA: Diagnosis not present

## 2018-12-12 NOTE — Telephone Encounter (Signed)
Dyann Ruddle with [facility] requests return call to review recommendations for patient's wound care. She also needs to know if you would like to proceed with the same wound care orders that patient had previously or if there should be changes.  Lastly, she needs to inform you that patient is very noncompliant with anything other than the wound care on that one side. She is not interested in education, probing, or assessing of her other co-morbidities and refuses to participate in medicare assessments.  Dyann Ruddle would like return call to [phone]

## 2018-12-15 ENCOUNTER — Telehealth: Payer: Self-pay | Admitting: Orthopedic Surgery

## 2018-12-15 DIAGNOSIS — Z6834 Body mass index (BMI) 34.0-34.9, adult: Secondary | ICD-10-CM | POA: Diagnosis not present

## 2018-12-15 DIAGNOSIS — E1165 Type 2 diabetes mellitus with hyperglycemia: Secondary | ICD-10-CM | POA: Diagnosis not present

## 2018-12-15 DIAGNOSIS — E876 Hypokalemia: Secondary | ICD-10-CM | POA: Diagnosis not present

## 2018-12-15 DIAGNOSIS — I358 Other nonrheumatic aortic valve disorders: Secondary | ICD-10-CM | POA: Diagnosis not present

## 2018-12-15 DIAGNOSIS — I10 Essential (primary) hypertension: Secondary | ICD-10-CM | POA: Diagnosis not present

## 2018-12-15 DIAGNOSIS — B9561 Methicillin susceptible Staphylococcus aureus infection as the cause of diseases classified elsewhere: Secondary | ICD-10-CM | POA: Diagnosis not present

## 2018-12-15 DIAGNOSIS — K219 Gastro-esophageal reflux disease without esophagitis: Secondary | ICD-10-CM | POA: Diagnosis not present

## 2018-12-15 DIAGNOSIS — E11628 Type 2 diabetes mellitus with other skin complications: Secondary | ICD-10-CM | POA: Diagnosis not present

## 2018-12-15 DIAGNOSIS — Z8543 Personal history of malignant neoplasm of ovary: Secondary | ICD-10-CM | POA: Diagnosis not present

## 2018-12-15 DIAGNOSIS — T86822 Skin graft (allograft) (autograft) infection: Secondary | ICD-10-CM | POA: Diagnosis not present

## 2018-12-15 DIAGNOSIS — Z7984 Long term (current) use of oral hypoglycemic drugs: Secondary | ICD-10-CM | POA: Diagnosis not present

## 2018-12-15 DIAGNOSIS — F0390 Unspecified dementia without behavioral disturbance: Secondary | ICD-10-CM | POA: Diagnosis not present

## 2018-12-15 DIAGNOSIS — F3289 Other specified depressive episodes: Secondary | ICD-10-CM | POA: Diagnosis not present

## 2018-12-15 DIAGNOSIS — D500 Iron deficiency anemia secondary to blood loss (chronic): Secondary | ICD-10-CM | POA: Diagnosis not present

## 2018-12-15 DIAGNOSIS — L02611 Cutaneous abscess of right foot: Secondary | ICD-10-CM | POA: Diagnosis not present

## 2018-12-15 NOTE — Telephone Encounter (Signed)
Dyann Ruddle was called and given verbal orders to continue the same treatment for patient which includes silvadene dressing changes 3 x week.

## 2018-12-15 NOTE — Telephone Encounter (Signed)
[name] was called and given verbal okay for PT orders and patient has no restrictions at this time for weightbearing.

## 2018-12-15 NOTE — Telephone Encounter (Signed)
Physical therapist, Shanon Brow,  from [facility] called triage line. He needs orders for recertification of therapy 2X/week for 5 weeks and 1X/week for 1 week. He also needs to know weightbearing status on right foot.  Call back [phone]  Thanks

## 2018-12-17 ENCOUNTER — Telehealth: Payer: Self-pay | Admitting: Orthopedic Surgery

## 2018-12-17 DIAGNOSIS — T86822 Skin graft (allograft) (autograft) infection: Secondary | ICD-10-CM | POA: Diagnosis not present

## 2018-12-17 DIAGNOSIS — L02611 Cutaneous abscess of right foot: Secondary | ICD-10-CM | POA: Diagnosis not present

## 2018-12-17 DIAGNOSIS — E1165 Type 2 diabetes mellitus with hyperglycemia: Secondary | ICD-10-CM | POA: Diagnosis not present

## 2018-12-17 DIAGNOSIS — E11628 Type 2 diabetes mellitus with other skin complications: Secondary | ICD-10-CM | POA: Diagnosis not present

## 2018-12-17 DIAGNOSIS — B9561 Methicillin susceptible Staphylococcus aureus infection as the cause of diseases classified elsewhere: Secondary | ICD-10-CM | POA: Diagnosis not present

## 2018-12-17 DIAGNOSIS — I358 Other nonrheumatic aortic valve disorders: Secondary | ICD-10-CM | POA: Diagnosis not present

## 2018-12-17 NOTE — Telephone Encounter (Signed)
Called and sw HHN to advise verbal ok they will continue to reach out to pt.

## 2018-12-17 NOTE — Telephone Encounter (Signed)
Joelene Millin with [facility] called in needs verbal order for okay to miss today's visit, she has tried contacting pt but no answer and also no answer at front door.    [phone]

## 2018-12-19 ENCOUNTER — Telehealth: Payer: Self-pay

## 2018-12-19 ENCOUNTER — Telehealth: Payer: Self-pay | Admitting: Orthopedic Surgery

## 2018-12-19 NOTE — Telephone Encounter (Signed)
Illene from home health therapy is calling really wanting to talk to you about patient She states that she doesn't answer the phone or the door more than half the time, just wants to discuss a few things with you about her  [phone]

## 2018-12-19 NOTE — Telephone Encounter (Signed)
Dyann Ruddle, Nurse, with [facility] called to let Dr. Sharol Given know that the patient is not answering the door and will not answer the phone for her wound care treatment.  Patient has rules to not come after 12 and not to call her husband.  Dyann Ruddle waited in the cold for like 15 minutes. Dyann Ruddle is requesting an order to try and see the patient on Monday. CB#[phone]

## 2018-12-19 NOTE — Telephone Encounter (Signed)
I called and sw HHN to advise verbal ok for nurse visit on Monday. Advised that I will call the pt and discuss that she must be compliant with appointments. HHN also requested that we discuss the restrictions that pt has put in place for appointments times and not calling her husband and can we address this as well. I called the pt and lm on vm to advise that [facility] would like to create a standing appointment so that they can come on the specific day at the same time and provide services. Wanting to make sure that she recives the highest benefit that home health can offer and help her to be successful in managing and healing her wounds to call back. Lm on vm to call with questions.

## 2018-12-19 NOTE — Telephone Encounter (Signed)
This has been addressed. See previous message.

## 2018-12-22 DIAGNOSIS — E11628 Type 2 diabetes mellitus with other skin complications: Secondary | ICD-10-CM | POA: Diagnosis not present

## 2018-12-22 DIAGNOSIS — L02611 Cutaneous abscess of right foot: Secondary | ICD-10-CM | POA: Diagnosis not present

## 2018-12-22 DIAGNOSIS — T86822 Skin graft (allograft) (autograft) infection: Secondary | ICD-10-CM | POA: Diagnosis not present

## 2018-12-22 DIAGNOSIS — I358 Other nonrheumatic aortic valve disorders: Secondary | ICD-10-CM | POA: Diagnosis not present

## 2018-12-22 DIAGNOSIS — B9561 Methicillin susceptible Staphylococcus aureus infection as the cause of diseases classified elsewhere: Secondary | ICD-10-CM | POA: Diagnosis not present

## 2018-12-22 DIAGNOSIS — E1165 Type 2 diabetes mellitus with hyperglycemia: Secondary | ICD-10-CM | POA: Diagnosis not present

## 2018-12-23 ENCOUNTER — Telehealth: Payer: Self-pay | Admitting: Orthopedic Surgery

## 2018-12-23 DIAGNOSIS — L02611 Cutaneous abscess of right foot: Secondary | ICD-10-CM | POA: Diagnosis not present

## 2018-12-23 DIAGNOSIS — T86822 Skin graft (allograft) (autograft) infection: Secondary | ICD-10-CM | POA: Diagnosis not present

## 2018-12-23 DIAGNOSIS — E1165 Type 2 diabetes mellitus with hyperglycemia: Secondary | ICD-10-CM | POA: Diagnosis not present

## 2018-12-23 DIAGNOSIS — E11628 Type 2 diabetes mellitus with other skin complications: Secondary | ICD-10-CM | POA: Diagnosis not present

## 2018-12-23 DIAGNOSIS — I358 Other nonrheumatic aortic valve disorders: Secondary | ICD-10-CM | POA: Diagnosis not present

## 2018-12-23 DIAGNOSIS — B9561 Methicillin susceptible Staphylococcus aureus infection as the cause of diseases classified elsewhere: Secondary | ICD-10-CM | POA: Diagnosis not present

## 2018-12-23 NOTE — Telephone Encounter (Signed)
[name] with [facility] called in needing a verbal okay to change frequency to 1 time a week for 1 week for this week per pt's request due to the holiday.   [phone]

## 2018-12-23 NOTE — Telephone Encounter (Signed)
I called and sw Annette to advise verbal ok for this week. Will call with any other questions.

## 2018-12-26 DIAGNOSIS — E1165 Type 2 diabetes mellitus with hyperglycemia: Secondary | ICD-10-CM | POA: Diagnosis not present

## 2018-12-26 DIAGNOSIS — I358 Other nonrheumatic aortic valve disorders: Secondary | ICD-10-CM | POA: Diagnosis not present

## 2018-12-26 DIAGNOSIS — L02611 Cutaneous abscess of right foot: Secondary | ICD-10-CM | POA: Diagnosis not present

## 2018-12-26 DIAGNOSIS — T86822 Skin graft (allograft) (autograft) infection: Secondary | ICD-10-CM | POA: Diagnosis not present

## 2018-12-26 DIAGNOSIS — E11628 Type 2 diabetes mellitus with other skin complications: Secondary | ICD-10-CM | POA: Diagnosis not present

## 2018-12-26 DIAGNOSIS — B9561 Methicillin susceptible Staphylococcus aureus infection as the cause of diseases classified elsewhere: Secondary | ICD-10-CM | POA: Diagnosis not present

## 2018-12-29 DIAGNOSIS — E11628 Type 2 diabetes mellitus with other skin complications: Secondary | ICD-10-CM | POA: Diagnosis not present

## 2018-12-29 DIAGNOSIS — E1165 Type 2 diabetes mellitus with hyperglycemia: Secondary | ICD-10-CM | POA: Diagnosis not present

## 2018-12-29 DIAGNOSIS — L02611 Cutaneous abscess of right foot: Secondary | ICD-10-CM | POA: Diagnosis not present

## 2018-12-29 DIAGNOSIS — T86822 Skin graft (allograft) (autograft) infection: Secondary | ICD-10-CM | POA: Diagnosis not present

## 2018-12-29 DIAGNOSIS — I358 Other nonrheumatic aortic valve disorders: Secondary | ICD-10-CM | POA: Diagnosis not present

## 2018-12-29 DIAGNOSIS — B9561 Methicillin susceptible Staphylococcus aureus infection as the cause of diseases classified elsewhere: Secondary | ICD-10-CM | POA: Diagnosis not present

## 2018-12-31 ENCOUNTER — Telehealth: Payer: Self-pay

## 2018-12-31 DIAGNOSIS — L02611 Cutaneous abscess of right foot: Secondary | ICD-10-CM | POA: Diagnosis not present

## 2018-12-31 DIAGNOSIS — E1165 Type 2 diabetes mellitus with hyperglycemia: Secondary | ICD-10-CM | POA: Diagnosis not present

## 2018-12-31 DIAGNOSIS — I358 Other nonrheumatic aortic valve disorders: Secondary | ICD-10-CM | POA: Diagnosis not present

## 2018-12-31 DIAGNOSIS — B9561 Methicillin susceptible Staphylococcus aureus infection as the cause of diseases classified elsewhere: Secondary | ICD-10-CM | POA: Diagnosis not present

## 2018-12-31 DIAGNOSIS — T86822 Skin graft (allograft) (autograft) infection: Secondary | ICD-10-CM | POA: Diagnosis not present

## 2018-12-31 DIAGNOSIS — I10 Essential (primary) hypertension: Secondary | ICD-10-CM

## 2018-12-31 DIAGNOSIS — E11628 Type 2 diabetes mellitus with other skin complications: Secondary | ICD-10-CM | POA: Diagnosis not present

## 2018-12-31 MED ORDER — HYDRALAZINE HCL 10 MG PO TABS: 10.0000 mg | ORAL_TABLET | Freq: Three times a day (TID) | ORAL | 5 refills | Status: DC

## 2018-12-31 MED ORDER — AMLODIPINE BESYLATE 5 MG PO TABS: 5.0000 mg | ORAL_TABLET | Freq: Every day | ORAL | 1 refills | Status: DC

## 2018-12-31 NOTE — Telephone Encounter (Signed)
Copied from [place] [phone]. Topic: General - Other >> Dec 31, 2018 12:00 PM Carolyn Stare wrote: Dionka a PT with [facility] home [facility] cal lto say pt resting bp is 178/98 but pt has not taken her medication cause she has not rec the med from the pharmacy she was advised by Dionka to check on her medication   [phone]

## 2018-12-31 NOTE — Telephone Encounter (Signed)
[name] please advise, seem like pt should have 1 year supply of lisinopril lats refill send 04/18/2018 but amlodipine and hydralazine last refill sent 06/30/2018 got 6 mo supply.

## 2018-12-31 NOTE — Telephone Encounter (Signed)
LVM for the to call back. Rx sent for 3 mo

## 2018-12-31 NOTE — Telephone Encounter (Signed)
Ok to send both medication refills. She is overdue for an appointment with me. Can you please contact her.

## 2018-12-31 NOTE — Addendum Note (Signed)
Addended byShawnie Pons on: 12/31/2018 02:13 PM   Modules accepted: Orders

## 2019-01-01 NOTE — Telephone Encounter (Signed)
Left another vm for the pt to call back.  

## 2019-01-02 DIAGNOSIS — B9561 Methicillin susceptible Staphylococcus aureus infection as the cause of diseases classified elsewhere: Secondary | ICD-10-CM | POA: Diagnosis not present

## 2019-01-02 DIAGNOSIS — E11628 Type 2 diabetes mellitus with other skin complications: Secondary | ICD-10-CM | POA: Diagnosis not present

## 2019-01-02 DIAGNOSIS — L02611 Cutaneous abscess of right foot: Secondary | ICD-10-CM | POA: Diagnosis not present

## 2019-01-02 DIAGNOSIS — T86822 Skin graft (allograft) (autograft) infection: Secondary | ICD-10-CM | POA: Diagnosis not present

## 2019-01-02 DIAGNOSIS — E1165 Type 2 diabetes mellitus with hyperglycemia: Secondary | ICD-10-CM | POA: Diagnosis not present

## 2019-01-02 DIAGNOSIS — I358 Other nonrheumatic aortic valve disorders: Secondary | ICD-10-CM | POA: Diagnosis not present

## 2019-01-05 DIAGNOSIS — L02611 Cutaneous abscess of right foot: Secondary | ICD-10-CM | POA: Diagnosis not present

## 2019-01-05 DIAGNOSIS — I358 Other nonrheumatic aortic valve disorders: Secondary | ICD-10-CM | POA: Diagnosis not present

## 2019-01-05 DIAGNOSIS — B9561 Methicillin susceptible Staphylococcus aureus infection as the cause of diseases classified elsewhere: Secondary | ICD-10-CM | POA: Diagnosis not present

## 2019-01-05 DIAGNOSIS — E11628 Type 2 diabetes mellitus with other skin complications: Secondary | ICD-10-CM | POA: Diagnosis not present

## 2019-01-05 DIAGNOSIS — E1165 Type 2 diabetes mellitus with hyperglycemia: Secondary | ICD-10-CM | POA: Diagnosis not present

## 2019-01-05 DIAGNOSIS — T86822 Skin graft (allograft) (autograft) infection: Secondary | ICD-10-CM | POA: Diagnosis not present

## 2019-01-06 DIAGNOSIS — E11628 Type 2 diabetes mellitus with other skin complications: Secondary | ICD-10-CM | POA: Diagnosis not present

## 2019-01-06 DIAGNOSIS — E1165 Type 2 diabetes mellitus with hyperglycemia: Secondary | ICD-10-CM | POA: Diagnosis not present

## 2019-01-06 DIAGNOSIS — B9561 Methicillin susceptible Staphylococcus aureus infection as the cause of diseases classified elsewhere: Secondary | ICD-10-CM | POA: Diagnosis not present

## 2019-01-06 DIAGNOSIS — T86822 Skin graft (allograft) (autograft) infection: Secondary | ICD-10-CM | POA: Diagnosis not present

## 2019-01-06 DIAGNOSIS — L02611 Cutaneous abscess of right foot: Secondary | ICD-10-CM | POA: Diagnosis not present

## 2019-01-06 DIAGNOSIS — I358 Other nonrheumatic aortic valve disorders: Secondary | ICD-10-CM | POA: Diagnosis not present

## 2019-01-08 ENCOUNTER — Telehealth: Payer: Self-pay

## 2019-01-08 NOTE — Telephone Encounter (Signed)
[name] with [facility] wanted to let Dr. Sharol Given know that patient was not answering her door to receive wound care today and that she is not able to R/S to see patient today.  Cb# is [phone].  Please advise.  Thank you.

## 2019-01-08 NOTE — Telephone Encounter (Signed)
Noted will discuss with pt at next office visit.

## 2019-01-12 DIAGNOSIS — E11628 Type 2 diabetes mellitus with other skin complications: Secondary | ICD-10-CM | POA: Diagnosis not present

## 2019-01-12 DIAGNOSIS — I358 Other nonrheumatic aortic valve disorders: Secondary | ICD-10-CM | POA: Diagnosis not present

## 2019-01-12 DIAGNOSIS — B9561 Methicillin susceptible Staphylococcus aureus infection as the cause of diseases classified elsewhere: Secondary | ICD-10-CM | POA: Diagnosis not present

## 2019-01-12 DIAGNOSIS — T86822 Skin graft (allograft) (autograft) infection: Secondary | ICD-10-CM | POA: Diagnosis not present

## 2019-01-12 DIAGNOSIS — E1165 Type 2 diabetes mellitus with hyperglycemia: Secondary | ICD-10-CM | POA: Diagnosis not present

## 2019-01-12 DIAGNOSIS — L02611 Cutaneous abscess of right foot: Secondary | ICD-10-CM | POA: Diagnosis not present

## 2019-01-14 DIAGNOSIS — T86822 Skin graft (allograft) (autograft) infection: Secondary | ICD-10-CM | POA: Diagnosis not present

## 2019-01-14 DIAGNOSIS — Z6834 Body mass index (BMI) 34.0-34.9, adult: Secondary | ICD-10-CM | POA: Diagnosis not present

## 2019-01-14 DIAGNOSIS — Z8543 Personal history of malignant neoplasm of ovary: Secondary | ICD-10-CM | POA: Diagnosis not present

## 2019-01-14 DIAGNOSIS — Z7984 Long term (current) use of oral hypoglycemic drugs: Secondary | ICD-10-CM | POA: Diagnosis not present

## 2019-01-14 DIAGNOSIS — F0390 Unspecified dementia without behavioral disturbance: Secondary | ICD-10-CM | POA: Diagnosis not present

## 2019-01-14 DIAGNOSIS — L02611 Cutaneous abscess of right foot: Secondary | ICD-10-CM | POA: Diagnosis not present

## 2019-01-14 DIAGNOSIS — E876 Hypokalemia: Secondary | ICD-10-CM | POA: Diagnosis not present

## 2019-01-14 DIAGNOSIS — E11628 Type 2 diabetes mellitus with other skin complications: Secondary | ICD-10-CM | POA: Diagnosis not present

## 2019-01-14 DIAGNOSIS — I10 Essential (primary) hypertension: Secondary | ICD-10-CM | POA: Diagnosis not present

## 2019-01-14 DIAGNOSIS — B9561 Methicillin susceptible Staphylococcus aureus infection as the cause of diseases classified elsewhere: Secondary | ICD-10-CM | POA: Diagnosis not present

## 2019-01-14 DIAGNOSIS — D500 Iron deficiency anemia secondary to blood loss (chronic): Secondary | ICD-10-CM | POA: Diagnosis not present

## 2019-01-14 DIAGNOSIS — F3289 Other specified depressive episodes: Secondary | ICD-10-CM | POA: Diagnosis not present

## 2019-01-14 DIAGNOSIS — I358 Other nonrheumatic aortic valve disorders: Secondary | ICD-10-CM | POA: Diagnosis not present

## 2019-01-14 DIAGNOSIS — K219 Gastro-esophageal reflux disease without esophagitis: Secondary | ICD-10-CM | POA: Diagnosis not present

## 2019-01-14 DIAGNOSIS — E1165 Type 2 diabetes mellitus with hyperglycemia: Secondary | ICD-10-CM | POA: Diagnosis not present

## 2019-01-15 ENCOUNTER — Telehealth: Payer: Self-pay | Admitting: Orthopedic Surgery

## 2019-01-15 DIAGNOSIS — E1165 Type 2 diabetes mellitus with hyperglycemia: Secondary | ICD-10-CM | POA: Diagnosis not present

## 2019-01-15 DIAGNOSIS — T86822 Skin graft (allograft) (autograft) infection: Secondary | ICD-10-CM | POA: Diagnosis not present

## 2019-01-15 DIAGNOSIS — B9561 Methicillin susceptible Staphylococcus aureus infection as the cause of diseases classified elsewhere: Secondary | ICD-10-CM | POA: Diagnosis not present

## 2019-01-15 DIAGNOSIS — E11628 Type 2 diabetes mellitus with other skin complications: Secondary | ICD-10-CM | POA: Diagnosis not present

## 2019-01-15 DIAGNOSIS — L02611 Cutaneous abscess of right foot: Secondary | ICD-10-CM | POA: Diagnosis not present

## 2019-01-15 DIAGNOSIS — I358 Other nonrheumatic aortic valve disorders: Secondary | ICD-10-CM | POA: Diagnosis not present

## 2019-01-15 NOTE — Telephone Encounter (Signed)
Received call from [place] from [facility] needing verbal orders to see patient once next week for wound care. Joelene Millin  Stated on Thursday  patient said she can handle her on wound care. The number to contact Joelene Millin is [phone]

## 2019-01-15 NOTE — Telephone Encounter (Signed)
Verbal okay was given for patient's wound orders.

## 2019-01-19 DIAGNOSIS — E1165 Type 2 diabetes mellitus with hyperglycemia: Secondary | ICD-10-CM | POA: Diagnosis not present

## 2019-01-19 DIAGNOSIS — E11628 Type 2 diabetes mellitus with other skin complications: Secondary | ICD-10-CM | POA: Diagnosis not present

## 2019-01-19 DIAGNOSIS — B9561 Methicillin susceptible Staphylococcus aureus infection as the cause of diseases classified elsewhere: Secondary | ICD-10-CM | POA: Diagnosis not present

## 2019-01-19 DIAGNOSIS — I358 Other nonrheumatic aortic valve disorders: Secondary | ICD-10-CM | POA: Diagnosis not present

## 2019-01-19 DIAGNOSIS — T86822 Skin graft (allograft) (autograft) infection: Secondary | ICD-10-CM | POA: Diagnosis not present

## 2019-01-19 DIAGNOSIS — L02611 Cutaneous abscess of right foot: Secondary | ICD-10-CM | POA: Diagnosis not present

## 2019-01-21 DIAGNOSIS — B9561 Methicillin susceptible Staphylococcus aureus infection as the cause of diseases classified elsewhere: Secondary | ICD-10-CM | POA: Diagnosis not present

## 2019-01-21 DIAGNOSIS — T86822 Skin graft (allograft) (autograft) infection: Secondary | ICD-10-CM | POA: Diagnosis not present

## 2019-01-21 DIAGNOSIS — E1165 Type 2 diabetes mellitus with hyperglycemia: Secondary | ICD-10-CM | POA: Diagnosis not present

## 2019-01-21 DIAGNOSIS — L02611 Cutaneous abscess of right foot: Secondary | ICD-10-CM | POA: Diagnosis not present

## 2019-01-21 DIAGNOSIS — E11628 Type 2 diabetes mellitus with other skin complications: Secondary | ICD-10-CM | POA: Diagnosis not present

## 2019-01-21 DIAGNOSIS — I358 Other nonrheumatic aortic valve disorders: Secondary | ICD-10-CM | POA: Diagnosis not present

## 2019-01-26 DIAGNOSIS — I358 Other nonrheumatic aortic valve disorders: Secondary | ICD-10-CM | POA: Diagnosis not present

## 2019-01-26 DIAGNOSIS — L02611 Cutaneous abscess of right foot: Secondary | ICD-10-CM | POA: Diagnosis not present

## 2019-01-26 DIAGNOSIS — B9561 Methicillin susceptible Staphylococcus aureus infection as the cause of diseases classified elsewhere: Secondary | ICD-10-CM | POA: Diagnosis not present

## 2019-01-26 DIAGNOSIS — E11628 Type 2 diabetes mellitus with other skin complications: Secondary | ICD-10-CM | POA: Diagnosis not present

## 2019-01-26 DIAGNOSIS — E1165 Type 2 diabetes mellitus with hyperglycemia: Secondary | ICD-10-CM | POA: Diagnosis not present

## 2019-01-26 DIAGNOSIS — T86822 Skin graft (allograft) (autograft) infection: Secondary | ICD-10-CM | POA: Diagnosis not present

## 2019-01-28 DIAGNOSIS — E11628 Type 2 diabetes mellitus with other skin complications: Secondary | ICD-10-CM | POA: Diagnosis not present

## 2019-01-28 DIAGNOSIS — T86822 Skin graft (allograft) (autograft) infection: Secondary | ICD-10-CM | POA: Diagnosis not present

## 2019-01-28 DIAGNOSIS — E1165 Type 2 diabetes mellitus with hyperglycemia: Secondary | ICD-10-CM | POA: Diagnosis not present

## 2019-01-28 DIAGNOSIS — B9561 Methicillin susceptible Staphylococcus aureus infection as the cause of diseases classified elsewhere: Secondary | ICD-10-CM | POA: Diagnosis not present

## 2019-01-28 DIAGNOSIS — I358 Other nonrheumatic aortic valve disorders: Secondary | ICD-10-CM | POA: Diagnosis not present

## 2019-01-28 DIAGNOSIS — L02611 Cutaneous abscess of right foot: Secondary | ICD-10-CM | POA: Diagnosis not present

## 2019-02-02 DIAGNOSIS — B9561 Methicillin susceptible Staphylococcus aureus infection as the cause of diseases classified elsewhere: Secondary | ICD-10-CM | POA: Diagnosis not present

## 2019-02-02 DIAGNOSIS — E11628 Type 2 diabetes mellitus with other skin complications: Secondary | ICD-10-CM | POA: Diagnosis not present

## 2019-02-02 DIAGNOSIS — T86822 Skin graft (allograft) (autograft) infection: Secondary | ICD-10-CM | POA: Diagnosis not present

## 2019-02-02 DIAGNOSIS — E1165 Type 2 diabetes mellitus with hyperglycemia: Secondary | ICD-10-CM | POA: Diagnosis not present

## 2019-02-02 DIAGNOSIS — I358 Other nonrheumatic aortic valve disorders: Secondary | ICD-10-CM | POA: Diagnosis not present

## 2019-02-02 DIAGNOSIS — L02611 Cutaneous abscess of right foot: Secondary | ICD-10-CM | POA: Diagnosis not present

## 2019-02-04 ENCOUNTER — Telehealth: Payer: Self-pay | Admitting: Orthopedic Surgery

## 2019-02-04 NOTE — Telephone Encounter (Signed)
Joelene Millin from [facility] called requesting verbal orders for 2 PRN's twice a week for 12 weeks from Dr. Jess Barters office. Room foot dislodged. Joelene Millin phone number is [phone] if call back is need for confirmation. Sending fax over to Dr. Jess Barters office with orders from [place]. Possible attention Crystal front desk rep.

## 2019-02-05 NOTE — Telephone Encounter (Signed)
Called and gave verbal ok for prn twice a week wound care for the next 12 weeks for the pt. Will call with any other questions.

## 2019-02-10 ENCOUNTER — Telehealth: Payer: Self-pay

## 2019-02-10 NOTE — Telephone Encounter (Signed)
I called and sw [name] and they advised that they feel they have done everything they can do for the pt and so they will discharge her from care and she will follow up with [name] in the office.

## 2019-02-10 NOTE — Telephone Encounter (Signed)
Rhonda with [facility] would like a call back concerning patient.  Stated that patient is being discharged.  Cb# [phone].  Please advise.  Thank you.

## 2019-02-11 ENCOUNTER — Telehealth: Payer: Self-pay

## 2019-02-11 NOTE — Telephone Encounter (Signed)
Kallie Locks, RN with [facility] called stating that patient is being discharged today due to being non complaint for home health for wound care.  Stated that patient has refused 3 visits for home health and that patient has not had wound care for about 1 week.  Cb# [phone].  Please advise.  Thank you.

## 2019-02-11 NOTE — Telephone Encounter (Signed)
noted 

## 2019-04-07 ENCOUNTER — Telehealth: Payer: Self-pay | Admitting: Nurse Practitioner

## 2019-04-07 NOTE — Telephone Encounter (Signed)
Patient is calling and requesting a refill for hydralazine sent to [facility] on [address]. CB is [phone]

## 2019-04-07 NOTE — Telephone Encounter (Signed)
Spoke with the pharmacy, they will text the pt once its ready to pick up.   Pt should have refills left until 06/2019.

## 2019-04-14 NOTE — Progress Notes (Signed)
Nurse connected with patient 04/15/19 at  9:30 AM EDT by a telephone enabled telemedicine application and verified that I am speaking with the correct person using two identifiers. Patient stated full name and DOB. Patient gave permission to continue with virtual visit. Patient's location was at home and Nurse's location was at [place] office.   Subjective:   [name] is a 79 y.o. female who presents for an Initial Medicare Annual Wellness Visit.  Review of Systems   Home Safety/Smoke Alarms: Feels safe in home. Smoke alarms in place.  Lives alone. 1 story home. Husband checks on her every other day. They live separately.   Uses cane in the house and walker she goes out.   Female:     Mammo-  declines     Dexa scan- active order       [facility]- 03/14/15.  Eye- pt states she will schedule.     Objective:      Advanced Directives 04/15/2019 04/25/2018 04/20/2018 04/06/2016 11/23/2015 08/29/2015 04/05/2015  Does Patient Have a Medical Advance Directive? _0  No No  Would patient like information on creating a medical advance directive? No - Patient declined No - Patient declined No - Patient declined No - Patient declined No - patient declined information No - patient declined information No - patient declined information    Current Medications (verified) Outpatient Encounter Medications as of 04/15/2019  Medication Sig  . amLODipine (NORVASC) 5 MG tablet Take 1 tablet (5 mg total) by mouth at bedtime.  . blood glucose meter kit and supplies KIT Dispense based on patient and insurance preference. Check glucose before breakfast and before supper. ICD10: 11.42  . Continuous Blood Gluc Receiver (FREESTYLE LIBRE READER) DEVI 1 Units by Does not apply route every 14 (fourteen) days.  . Continuous Blood Gluc Sensor (FREESTYLE LIBRE 14 DAY SENSOR) MISC 1 Units by Does not apply route every 14 (fourteen) days.  Marland Kitchen glipiZIDE (GLIPIZIDE XL) 2.5 MG 24 hr tablet Take 1 tablet (2.5 mg  total) by mouth daily with breakfast.  . hydrALAZINE (APRESOLINE) 10 MG tablet Take 1 tablet (10 mg total) by mouth 3 (three) times daily.  Marland Kitchen lisinopril (PRINIVIL,ZESTRIL) 5 MG tablet Take 1 tablet (5 mg total) by mouth daily.  . rosuvastatin (CRESTOR) 10 MG tablet Take 1 tablet (10 mg total) by mouth daily.  . silver sulfADIAZINE (SILVADENE) 1 % cream Apply 1 application topically daily.  . silver sulfADIAZINE (SILVADENE) 1 % cream Apply 1 application topically daily. Apply to affected area daily plus dry dressing  . cholecalciferol (VITAMIN D) 400 units TABS tablet Take 1,000 Units by mouth daily.   No facility-administered encounter medications on file as of 04/15/2019.    Allergies (verified) Patient has no known allergies.   History: Past Medical History:  Diagnosis Date  . Abdominal distension   . Anemia   . Anxiety   . B12 deficiency   . Balance problem   . Blood in stool   . Cancer (HCC)    OVARIAN  . Colon polyps   . Dementia ([place])    EARLY  . Dementia ([place])    EARLY  . Depression   . GERD (gastroesophageal reflux disease)   . History of blood transfusion   . Hypertension   . Hypomagnesemia   . Urine incontinence    Past Surgical History:  Procedure Laterality Date  . ABDOMINAL HYSTERECTOMY    . APPLICATION OF WOUND VAC  04/25/2018   Procedure:  Application Of Wound Vac;  Surgeon: Newt Minion, MD;  Location: [place];  Service: Orthopedics;;  . CATARACT EXTRACTION W/PHACO Right 07/26/2015   Procedure: CATARACT EXTRACTION PHACO AND INTRAOCULAR LENS PLACEMENT ([place]);  Surgeon: Birder Robson, MD;  Location: [facility];  Service: Ophthalmology;  Laterality: Right;  [name]   1:03.5 AP%  23% CDE   14.59 fluid pack lot # [id number]  . CATARACT EXTRACTION W/PHACO Left 08/29/2015   Procedure: CATARACT EXTRACTION PHACO AND INTRAOCULAR LENS PLACEMENT ([place]);  Surgeon: Birder Robson, MD;  Location: [facility];  Service: Ophthalmology;  Laterality: Left;  [name] 00:40 AP% 20.9 CDE  8.50 fluid pack lot # [id number]  . COLONOSCOPY WITH PROPOFOL N/A 03/14/2015   Procedure: COLONOSCOPY WITH PROPOFOL;  Surgeon: Hulen Luster, MD;  [facility];  Service: Gastroenterology;  Laterality: N/A;  . ESOPHAGOGASTRODUODENOSCOPY (EGD) WITH PROPOFOL N/A 03/14/2015   Procedure: ESOPHAGOGASTRODUODENOSCOPY (EGD) WITH PROPOFOL;  Surgeon: Hulen Luster, MD;  Location: [facility];  Service: Gastroenterology;  Laterality: N/A;  . I & D EXTREMITY Right 04/22/2018   Procedure: RIGHT FOOT DEBRIDEMENT AND FOREIGN BODY REMOVAL;  Surgeon: Newt Minion, MD;  Location: [place];  Service: Orthopedics;  Laterality: Right;  . none    . SKIN SPLIT GRAFT Right 04/25/2018   Procedure: REPEAT DEBRIDEMENT RIGHT FOOT, SPLIT THICKNESS SKIN GRAFT, APPLY VAC;  Surgeon: Newt Minion, MD;  Location: [place];  Service: Orthopedics;  Laterality: Right;  . TOTAL ABDOMINAL HYSTERECTOMY W/ BILATERAL SALPINGOOPHORECTOMY  04/19/2015   exploratory laparoscopy, total abdominal hysterectomy, bilateral salping oophorectomy, left pelvic lymph node dissection, omentectomy   Family History  Family history unknown: Yes   Social History   Socioeconomic History  . Marital status: Legally Separated    Spouse name: Not on file  . Number of children: Not on file  . Years of education: Not on file  . Highest education level: Not on file  Occupational History  . Not on file  Tobacco Use  . Smoking status: Never Smoker  . Smokeless tobacco: Never Used  Substance and Sexual Activity  . Alcohol use: No  . Drug use: No  . Sexual activity: Not Currently  Other Topics Concern  . Not on file  Social History Narrative  . Not on file   Social Determinants of Health   Financial Resource Strain: Low Risk   . Difficulty of Paying Living Expenses: Not very hard  Food Insecurity: No Food Insecurity  . Worried About Charity fundraiser in the Last Year: Never true  . Ran Out of Food in the Last Year: Never true  Transportation  Needs: No Transportation Needs  . Lack of Transportation (Medical): No  . Lack of Transportation (Non-Medical): No  Physical Activity:   . Days of Exercise per Week:   . Minutes of Exercise per Session:   Stress:   . Feeling of Stress :   Social Connections:   . Frequency of Communication with Friends and Family:   . Frequency of Social Gatherings with Friends and Family:   . Attends Religious Services:   . Active Member of Clubs or Organizations:   . Attends Archivist Meetings:   Marland Kitchen Marital Status:     Tobacco Counseling Counseling given: Not Answered   Clinical Intake: Pain : No/denies pain   Activities of Daily Living In your present state of health, do you have any difficulty performing the following activities: 04/15/2019 04/21/2018  Hearing? N -  Vision? N -  Difficulty concentrating or making decisions? N -  Walking or climbing stairs? N -  Dressing or bathing? N -  Doing errands, shopping? N Y  Conservation officer, nature and eating ? N -  Using the Toilet? N -  In the past six months, have you accidently leaked urine? Y -  Do you have problems with loss of bowel control? N -  Managing your Medications? N -  Managing your Finances? N -  Housekeeping or managing your Housekeeping? N -  Some recent data might be hidden     Immunizations and Health Maintenance  There is no immunization history on file for this patient. Health Maintenance Due  Topic Date Due  . FOOT EXAM  Never done  . OPHTHALMOLOGY EXAM  Never done  . TETANUS/TDAP  Never done  . DEXA SCAN  Never done  . PNA vac Low Risk Adult (1 of 2 - PCV13) Never done  . INFLUENZA VACCINE  Never done  . HEMOGLOBIN A1C  03/15/2019    Patient Care Team: [name], [name], NP as PCP - General (Internal Medicine)  Indicate any recent Medical Services you may have received from other than [facility] providers in the past year (date may be approximate).     Assessment:   This is a routine wellness examination  for Emonni. Physical assessment deferred to PCP.  Hearing/Vision screen Unable to assess. This visit is enabled though telemedicine due to Covid 19.   Dietary issues and exercise activities discussed: Current Exercise Habits: The patient does not participate in regular exercise at present, Exercise limited by: None identified Diet (meal preparation, eat out, water intake, caffeinated beverages, dairy products, fruits and vegetables): 24 hr recall Breakfast: yogurt and banana Lunch: fish sandwich Dinner:   PB crackers   Goals    . DIET - INCREASE WATER INTAKE    . Increase physical activity      Depression Screen PHQ 2/9 Scores 04/15/2019 09/12/2018 04/17/2018 02/03/2016  PHQ - 2 Score 0 0 0 0    Fall Risk Fall Risk  04/15/2019 04/17/2018 02/03/2016  Falls in the past year? 0 0 No  Number falls in past yr: 0 - -  Injury with Fall? 0 - -  Follow up Education provided;Falls prevention discussed - -    Cognitive Function: Ad8 score reviewed for issues:  Issues making decisions:no  Less interest in hobbies / activities:no  Repeats questions, stories (family complaining):no  Trouble using ordinary gadgets (microwave, computer, phone):no  Forgets the month or year: no  Mismanaging finances: no  Remembering appts:no  Daily problems with thinking and/or memory:no Ad8 score is=0         Screening Tests Health Maintenance  Topic Date Due  . FOOT EXAM  Never done  . OPHTHALMOLOGY EXAM  Never done  . TETANUS/TDAP  Never done  . DEXA SCAN  Never done  . PNA vac Low Risk Adult (1 of 2 - PCV13) Never done  . INFLUENZA VACCINE  Never done  . HEMOGLOBIN A1C  03/15/2019    Plan:    Please schedule your next medicare wellness visit with me in 1 yr.  Continue to eat heart healthy diet (full of fruits, vegetables, whole grains, lean protein, water--limit salt, fat, and sugar intake) and increase physical activity as tolerated.  Continue doing brain stimulating activities  (puzzles, reading, adult coloring books, staying active) to keep memory sharp.     I have personally reviewed and noted the following in the patient's chart:   .  Medical and social history . Use of alcohol, tobacco or illicit drugs  . Current medications and supplements . Functional ability and status . Nutritional status . Physical activity . Advanced directives . List of other physicians . Hospitalizations, surgeries, and ER visits in previous 12 months . Vitals . Screenings to include cognitive, depression, and falls . Referrals and appointments  In addition, I have reviewed and discussed with patient certain preventive protocols, quality metrics, and best practice recommendations. A written personalized care plan for preventive services as well as general preventive health recommendations were provided to patient.     Shela Nevin, [place]   04/15/2019

## 2019-04-15 ENCOUNTER — Other Ambulatory Visit: Payer: Self-pay

## 2019-04-15 ENCOUNTER — Ambulatory Visit (INDEPENDENT_AMBULATORY_CARE_PROVIDER_SITE_OTHER): Payer: Medicare Other | Admitting: *Deleted

## 2019-04-15 ENCOUNTER — Encounter: Payer: Self-pay | Admitting: *Deleted

## 2019-04-15 DIAGNOSIS — Z0000 Encounter for general adult medical examination without abnormal findings: Secondary | ICD-10-CM

## 2019-04-15 NOTE — Patient Instructions (Signed)
Please schedule your next medicare wellness visit with me in 1 yr.  Continue to eat heart healthy diet (full of fruits, vegetables, whole grains, lean protein, water--limit salt, fat, and sugar intake) and increase physical activity as tolerated.  Continue doing brain stimulating activities (puzzles, reading, adult coloring books, staying active) to keep memory sharp.    [name] , Thank you for taking time to come for your Medicare Wellness Visit. I appreciate your ongoing commitment to your health goals. Please review the following plan we discussed and let me know if I can assist you in the future.   These are the goals we discussed: Goals    . DIET - INCREASE WATER INTAKE    . Increase physical activity       This is a list of the screening recommended for you and due dates:  Health Maintenance  Topic Date Due  . Complete foot exam   Never done  . Eye exam for diabetics  Never done  . Tetanus Vaccine  Never done  . DEXA scan (bone density measurement)  Never done  . Pneumonia vaccines (1 of 2 - PCV13) Never done  . Flu Shot  Never done  . Hemoglobin A1C  03/15/2019    Preventive Care 65 Years and Older, Female Preventive care refers to lifestyle choices and visits with your health care provider that can promote health and wellness. This includes:  A yearly physical exam. This is also called an annual well check.  Regular dental and eye exams.  Immunizations.  Screening for certain conditions.  Healthy lifestyle choices, such as diet and exercise. What can I expect for my preventive care visit? Physical exam Your health care provider will check:  Height and weight. These may be used to calculate body mass index (BMI), which is a measurement that tells if you are at a healthy weight.  Heart rate and blood pressure.  Your skin for abnormal spots. Counseling Your health care provider may ask you questions about:  Alcohol, tobacco, and drug use.  Emotional  well-being.  Home and relationship well-being.  Sexual activity.  Eating habits.  History of falls.  Memory and ability to understand (cognition).  Work and work Statistician.  Pregnancy and menstrual history. What immunizations do I need?  Influenza (flu) vaccine  This is recommended every year. Tetanus, diphtheria, and pertussis (Tdap) vaccine  You may need a Td booster every 10 years. Varicella (chickenpox) vaccine  You may need this vaccine if you have not already been vaccinated. Zoster (shingles) vaccine  You may need this after age 63. Pneumococcal conjugate (PCV13) vaccine  One dose is recommended after age 72. Pneumococcal polysaccharide (PPSV23) vaccine  One dose is recommended after age 24. Measles, mumps, and rubella (MMR) vaccine  You may need at least one dose of MMR if you were born in 1957 or later. You may also need a second dose. Meningococcal conjugate (MenACWY) vaccine  You may need this if you have certain conditions. Hepatitis A vaccine  You may need this if you have certain conditions or if you travel or work in places where you may be exposed to hepatitis A. Hepatitis B vaccine  You may need this if you have certain conditions or if you travel or work in places where you may be exposed to hepatitis B. Haemophilus influenzae type b (Hib) vaccine  You may need this if you have certain conditions. You may receive vaccines as individual doses or as more than one vaccine  together in one shot (combination vaccines). Talk with your health care provider about the risks and benefits of combination vaccines. What tests do I need? Blood tests  Lipid and cholesterol levels. These may be checked every 5 years, or more frequently depending on your overall health.  Hepatitis C test.  Hepatitis B test. Screening  Lung cancer screening. You may have this screening every year starting at age 43 if you have a 30-pack-year history of smoking and  currently smoke or have quit within the past 15 years.  Colorectal cancer screening. All adults should have this screening starting at age 43 and continuing until age 86. Your health care provider may recommend screening at age 31 if you are at increased risk. You will have tests every 1-10 years, depending on your results and the type of screening test.  Diabetes screening. This is done by checking your blood sugar (glucose) after you have not eaten for a while (fasting). You may have this done every 1-3 years.  Mammogram. This may be done every 1-2 years. Talk with your health care provider about how often you should have regular mammograms.  BRCA-related cancer screening. This may be done if you have a family history of breast, ovarian, tubal, or peritoneal cancers. Other tests  Sexually transmitted disease (STD) testing.  Bone density scan. This is done to screen for osteoporosis. You may have this done starting at age 68. Follow these instructions at home: Eating and drinking  Eat a diet that includes fresh fruits and vegetables, whole grains, lean protein, and low-fat dairy products. Limit your intake of foods with high amounts of sugar, saturated fats, and salt.  Take vitamin and mineral supplements as recommended by your health care provider.  Do not drink alcohol if your health care provider tells you not to drink.  If you drink alcohol: ? Limit how much you have to 0-1 drink a day. ? Be aware of how much alcohol is in your drink. In the [place] one drink equals one 12 oz bottle of beer (355 mL), one 5 oz glass of wine (148 mL), or one 1 oz glass of hard liquor (44 mL). Lifestyle  Take daily care of your teeth and gums.  Stay active. Exercise for at least 30 minutes on 5 or more days each week.  Do not use any products that contain nicotine or tobacco, such as cigarettes, e-cigarettes, and chewing tobacco. If you need help quitting, ask your health care provider.  If you are  sexually active, practice safe sex. Use a condom or other form of protection in order to prevent STIs (sexually transmitted infections).  Talk with your health care provider about taking a low-dose aspirin or statin. What's next?  Go to your health care provider once a year for a well check visit.  Ask your health care provider how often you should have your eyes and teeth checked.  Stay up to date on all vaccines. This information is not intended to replace advice given to you by your health care provider. Make sure you discuss any questions you have with your health care provider. Document Revised: 01/09/2018 Document Reviewed: 01/09/2018 Elsevier Patient Education  2020 [employer].

## 2019-04-29 ENCOUNTER — Encounter: Payer: Medicare Other | Admitting: Nurse Practitioner

## 2019-04-29 ENCOUNTER — Other Ambulatory Visit: Payer: Self-pay

## 2019-04-29 ENCOUNTER — Encounter: Payer: Self-pay | Admitting: Nurse Practitioner

## 2019-04-29 ENCOUNTER — Other Ambulatory Visit: Payer: Self-pay | Admitting: Nurse Practitioner

## 2019-04-29 DIAGNOSIS — I10 Essential (primary) hypertension: Secondary | ICD-10-CM

## 2019-04-29 MED ORDER — HYDRALAZINE HCL 10 MG PO TABS: 10.0000 mg | ORAL_TABLET | Freq: Three times a day (TID) | ORAL | 0 refills | Status: DC

## 2019-04-30 NOTE — Progress Notes (Signed)
This encounter was created in error - please disregard.

## 2019-05-06 ENCOUNTER — Ambulatory Visit (INDEPENDENT_AMBULATORY_CARE_PROVIDER_SITE_OTHER): Payer: Medicare HMO | Admitting: Nurse Practitioner

## 2019-05-06 ENCOUNTER — Encounter: Payer: Self-pay | Admitting: Nurse Practitioner

## 2019-05-06 ENCOUNTER — Other Ambulatory Visit: Payer: Self-pay

## 2019-05-06 ENCOUNTER — Other Ambulatory Visit: Payer: Medicare HMO

## 2019-05-06 VITALS — BP 132/82 | HR 81 | Temp 97.1°F | Ht 64.0 in | Wt 215.6 lb

## 2019-05-06 DIAGNOSIS — Z136 Encounter for screening for cardiovascular disorders: Secondary | ICD-10-CM | POA: Diagnosis not present

## 2019-05-06 DIAGNOSIS — E611 Iron deficiency: Secondary | ICD-10-CM

## 2019-05-06 DIAGNOSIS — E1142 Type 2 diabetes mellitus with diabetic polyneuropathy: Secondary | ICD-10-CM | POA: Diagnosis not present

## 2019-05-06 DIAGNOSIS — I10 Essential (primary) hypertension: Secondary | ICD-10-CM

## 2019-05-06 DIAGNOSIS — E559 Vitamin D deficiency, unspecified: Secondary | ICD-10-CM | POA: Diagnosis not present

## 2019-05-06 DIAGNOSIS — R7309 Other abnormal glucose: Secondary | ICD-10-CM

## 2019-05-06 DIAGNOSIS — Z13220 Encounter for screening for lipoid disorders: Secondary | ICD-10-CM

## 2019-05-06 LAB — CBC
HCT: 42.2 % (ref 36.0–46.0)
Hemoglobin: 14.4 g/dL (ref 12.0–15.0)
MCHC: 34.1 g/dL (ref 30.0–36.0)
MCV: 80.1 fl (ref 78.0–100.0)
Platelets: 343 10*3/uL (ref 150.0–400.0)
RBC: 5.27 Mil/uL — ABNORMAL HIGH (ref 3.87–5.11)
RDW: 14.3 % (ref 11.5–15.5)
WBC: 7.5 10*3/uL (ref 4.0–10.5)

## 2019-05-06 LAB — COMPREHENSIVE METABOLIC PANEL
ALT: 11 U/L (ref 0–35)
AST: 12 U/L (ref 0–37)
Albumin: 4.2 g/dL (ref 3.5–5.2)
Alkaline Phosphatase: 82 U/L (ref 39–117)
BUN: 14 mg/dL (ref 6–23)
CO2: 30 mEq/L (ref 19–32)
Calcium: 9.9 mg/dL (ref 8.4–10.5)
Chloride: 100 mEq/L (ref 96–112)
Creatinine, Ser: 0.93 mg/dL (ref 0.40–1.20)
GFR: 70.72 mL/min (ref >60.00)
Glucose, Bld: 214 mg/dL — ABNORMAL HIGH (ref 70–99)
Potassium: 4.6 mEq/L (ref 3.5–5.1)
Sodium: 136 mEq/L (ref 135–145)
Total Bilirubin: 0.7 mg/dL (ref 0.2–1.2)
Total Protein: 8.2 g/dL (ref 6.0–8.3)

## 2019-05-06 LAB — GLUCOSE, POCT (MANUAL RESULT ENTRY): POC Glucose: 241 mg/dl — AB (ref 70–99)

## 2019-05-06 LAB — LIPID PANEL
Cholesterol: 211 mg/dL — ABNORMAL HIGH (ref 0–200)
HDL: 50.6 mg/dL (ref >39.00)
LDL Cholesterol: 132 mg/dL — ABNORMAL HIGH (ref 0–99)
NonHDL: 160.67
Total CHOL/HDL Ratio: 4
Triglycerides: 143 mg/dL (ref 0.0–149.0)
VLDL: 28.6 mg/dL (ref 0.0–40.0)

## 2019-05-06 LAB — TSH: TSH: 2.86 u[IU]/mL (ref 0.35–4.50)

## 2019-05-06 MED ORDER — AMLODIPINE BESYLATE 5 MG PO TABS: 5.0000 mg | ORAL_TABLET | Freq: Every day | ORAL | 3 refills | Status: AC

## 2019-05-06 MED ORDER — LISINOPRIL 5 MG PO TABS: 5.0000 mg | ORAL_TABLET | Freq: Every day | ORAL | 3 refills | Status: AC

## 2019-05-06 MED ORDER — HYDRALAZINE HCL 10 MG PO TABS: 10.0000 mg | ORAL_TABLET | Freq: Three times a day (TID) | ORAL | 11 refills | Status: AC

## 2019-05-06 MED ORDER — ROSUVASTATIN CALCIUM 20 MG PO TABS: 20.0000 mg | ORAL_TABLET | Freq: Every day | ORAL | 3 refills | Status: AC

## 2019-05-06 NOTE — Progress Notes (Signed)
Subjective:  Patient ID: [name], female    DOB: Dec 20, 1940  Age: 79 y.o. MRN: [id number]  CC: Follow-up (3 month f/u DM, HTN, an hyperlipidemia-pt had half a bananna adn 2 crackers to take meds this morn//no tetanus or shots today/)  HPI Accompanied by her husband (he is in the car) She denies any difficulty getting food or her medications. She relies on her husband for transportation. She declined applying for [employer] bus.  HTN: BP at goal with hydralazine, amlodipine, and lisinopril. BP Readings from Last 3 Encounters:  05/06/19 132/82  09/12/18 (!) 142/80  08/05/18 136/78   DM: Does not check glucose at home. States she has a glucose but unsure if she has lancets and strip. Does not know brand of machine either. Denies any increase urination or polyphagia. Did not schedule appt with ophthalmology. Has diabetic ulcer: managed by Dr. Sharol Given (ortho). She does not want foot exam today.  Wt Readings from Last 3 Encounters:  05/06/19 215 lb 9.6 oz (97.8 kg)  11/28/18 218 lb (98.9 kg)  10/23/18 218 lb (98.9 kg)   Anemia: No melena or hematuria or hematochezia.  Reviewed past Medical, Social and Family history today.  Outpatient Medications Prior to Visit  Medication Sig Dispense Refill  . blood glucose meter kit and supplies KIT Dispense based on patient and insurance preference. Check glucose before breakfast and before supper. ICD10: 11.42 1 each 0  . cholecalciferol (VITAMIN D) 400 units TABS tablet Take 1,000 Units by mouth daily.    . Continuous Blood Gluc Receiver (FREESTYLE LIBRE READER) DEVI 1 Units by Does not apply route every 14 (fourteen) days. 1 Device 0  . Continuous Blood Gluc Sensor (FREESTYLE LIBRE 14 DAY SENSOR) MISC 1 Units by Does not apply route every 14 (fourteen) days. 2 each 2  . amLODipine (NORVASC) 5 MG tablet Take 1 tablet (5 mg total) by mouth at bedtime. 90 tablet 1  . glipiZIDE (GLIPIZIDE XL) 2.5 MG 24 hr tablet Take 1 tablet (2.5 mg  total) by mouth daily with breakfast. 90 tablet 1  . hydrALAZINE (APRESOLINE) 10 MG tablet Take 1 tablet (10 mg total) by mouth 3 (three) times daily. 90 tablet 0  . lisinopril (PRINIVIL,ZESTRIL) 5 MG tablet Take 1 tablet (5 mg total) by mouth daily. 90 tablet 3  . rosuvastatin (CRESTOR) 10 MG tablet Take 1 tablet (10 mg total) by mouth daily. 90 tablet 1   No facility-administered medications prior to visit.    ROS See HPI  Objective:  BP 132/82   Pulse 81   Temp (!) 97.1 F (36.2 C) (Tympanic)   Ht _0  (1.626 m)   Wt 215 lb 9.6 oz (97.8 kg)   SpO2 96%   BMI 37.01 kg/m   BP Readings from Last 3 Encounters:  05/06/19 132/82  09/12/18 (!) 142/80  08/05/18 136/78    Wt Readings from Last 3 Encounters:  05/06/19 215 lb 9.6 oz (97.8 kg)  11/28/18 218 lb (98.9 kg)  10/23/18 218 lb (98.9 kg)   Physical Exam Constitutional:      Appearance: She is obese.  Cardiovascular:     Rate and Rhythm: Normal rate and regular rhythm.     Pulses: Normal pulses.     Heart sounds: Normal heart sounds.  Pulmonary:     Effort: Pulmonary effort is normal.     Breath sounds: Normal breath sounds.  Abdominal:     General: Bowel sounds are normal.  Palpations: Abdomen is soft.  Skin:    Comments: Declined foot exam  Neurological:     Mental Status: She is alert and oriented to person, place, and time.  Psychiatric:        Thought Content: Thought content normal.    Lab Results  Component Value Date   WBC 7.5 05/06/2019   HGB 14.4 05/06/2019   HCT 42.2 05/06/2019   PLT 343.0 05/06/2019   GLUCOSE 214 (H) 05/06/2019   CHOL 211 (H) 05/06/2019   TRIG 143.0 05/06/2019   HDL 50.60 05/06/2019   LDLDIRECT 139.0 03/06/2016   LDLCALC 132 (H) 05/06/2019   ALT 11 05/06/2019   AST 12 05/06/2019   NA 136 05/06/2019   K 4.6 05/06/2019   CL 100 05/06/2019   CREATININE 0.93 05/06/2019   BUN 14 05/06/2019   CO2 30 05/06/2019   TSH 2.86 05/06/2019   INR 1.1 07/26/2013   HGBA1C 9.9  (H) 05/06/2019   MICROALBUR 1.2 06/27/2018     Assessment & Plan:  This visit occurred during the SARS-CoV-2 public health emergency.  Safety protocols were in place, including screening questions prior to the visit, additional usage of staff PPE, and extensive cleaning of exam room while observing appropriate contact time as indicated for disinfecting solutions.   [name] was seen today for follow-up.  Diagnoses and all orders for this visit:  Type 2 diabetes mellitus with diabetic polyneuropathy, without long-term current use of insulin (HCC) -     POCT Glucose (CBG) -     Hemoglobin A1c -     Cancel: Microalbumin / creatinine urine ratio -     Comprehensive metabolic panel -     Cancel: Microalbumin / creatinine urine ratio -     Microalbumin / creatinine urine ratio; Future -     rosuvastatin (CRESTOR) 20 MG tablet; Take 1 tablet (20 mg total) by mouth daily. -     glipiZIDE (GLIPIZIDE XL) 5 MG 24 hr tablet; Take 1 tablet (5 mg total) by mouth daily with breakfast. -     metFORMIN (GLUCOPHAGE) 500 MG tablet; Take 1 tablet (500 mg total) by mouth 2 (two) times daily with a meal.  Essential hypertension -     Comprehensive metabolic panel -     TSH -     Cancel: Microalbumin / creatinine urine ratio -     Microalbumin / creatinine urine ratio; Future -     amLODipine (NORVASC) 5 MG tablet; Take 1 tablet (5 mg total) by mouth at bedtime. -     hydrALAZINE (APRESOLINE) 10 MG tablet; Take 1 tablet (10 mg total) by mouth 3 (three) times daily. -     lisinopril (ZESTRIL) 5 MG tablet; Take 1 tablet (5 mg total) by mouth daily.  Iron deficiency -     CBC  Encounter for lipid screening for cardiovascular disease -     Lipid panel  Vitamin D deficiency -     Vitamin D 1,25 dihydroxy   I have changed [name]'s lisinopril, rosuvastatin, and glipiZIDE. I am also having her start on metFORMIN. Additionally, I am having her maintain her cholecalciferol, FreeStyle Libre Reader,  FreeStyle Libre 14 Day Sensor, blood glucose meter kit and supplies, amLODipine, and hydrALAZINE.  Meds ordered this encounter  Medications  . amLODipine (NORVASC) 5 MG tablet    Sig: Take 1 tablet (5 mg total) by mouth at bedtime.    Dispense:  90 tablet    Refill:  3  Order Specific Question:   Supervising Provider    Answer:   Ronnald Nian [[id number]]  . hydrALAZINE (APRESOLINE) 10 MG tablet    Sig: Take 1 tablet (10 mg total) by mouth 3 (three) times daily.    Dispense:  90 tablet    Refill:  11    Order Specific Question:   Supervising Provider    Answer:   Ronnald Nian [[id number]]  . lisinopril (ZESTRIL) 5 MG tablet    Sig: Take 1 tablet (5 mg total) by mouth daily.    Dispense:  90 tablet    Refill:  3    Order Specific Question:   Supervising Provider    Answer:   Ronnald Nian [[id number]]  . rosuvastatin (CRESTOR) 20 MG tablet    Sig: Take 1 tablet (20 mg total) by mouth daily.    Dispense:  90 tablet    Refill:  3    Change in dose    Order Specific Question:   Supervising Provider    Answer:   Ronnald Nian [[id number]]  . glipiZIDE (GLIPIZIDE XL) 5 MG 24 hr tablet    Sig: Take 1 tablet (5 mg total) by mouth daily with breakfast.    Dispense:  90 tablet    Refill:  1    Change in dose    Order Specific Question:   Supervising Provider    Answer:   Ronnald Nian [[id number]]  . metFORMIN (GLUCOPHAGE) 500 MG tablet    Sig: Take 1 tablet (500 mg total) by mouth 2 (two) times daily with a meal.    Dispense:  180 tablet    Refill:  1    Order Specific Question:   Supervising Provider    Answer:   Ronnald Nian [[id number]]    Problem List Items Addressed This Visit      Cardiovascular and Mediastinum   Essential hypertension   Relevant Medications   amLODipine (NORVASC) 5 MG tablet   hydrALAZINE (APRESOLINE) 10 MG tablet   lisinopril (ZESTRIL) 5 MG tablet   rosuvastatin (CRESTOR) 20 MG tablet   Other Relevant Orders   Comprehensive  metabolic panel (Completed)   TSH (Completed)   Microalbumin / creatinine urine ratio     Endocrine   Type 2 diabetes mellitus with diabetic polyneuropathy, without long-term current use of insulin (HCC) - Primary   Relevant Medications   lisinopril (ZESTRIL) 5 MG tablet   rosuvastatin (CRESTOR) 20 MG tablet   glipiZIDE (GLIPIZIDE XL) 5 MG 24 hr tablet   metFORMIN (GLUCOPHAGE) 500 MG tablet   Other Relevant Orders   POCT Glucose (CBG) (Completed)   Hemoglobin A1c   Comprehensive metabolic panel (Completed)   Microalbumin / creatinine urine ratio     Other   Iron deficiency   Relevant Orders   CBC (Completed)   Vitamin D deficiency   Relevant Orders   Vitamin D 1,25 dihydroxy    Other Visit Diagnoses    Encounter for lipid screening for cardiovascular disease       Relevant Orders   Lipid panel (Completed)       Follow-up: Return in 3 months (on 08/05/2019) for DM and HTN (F2F, 6mns).  CWilfred Lacy NP

## 2019-05-06 NOTE — Patient Instructions (Addendum)
Normal renal and liver function Elevated glucose, pending hgbA1c and urine microalbumin Abnormal lipid panel: with a 37% risk for cardiovascular disease, crestor dose increased to 20mg . New rx sent. Stable cbc Pending vitamin D. Return to lab for urine collection  Check glucose before breakfast and before supper. bring glucose reading during next office visit.  HgbA1c of 9.9: uncontrolled DM. Increase glipizide to 5mg  and start metformin 500mg  BID. New rx sent F/up in 30months in office

## 2019-05-07 ENCOUNTER — Telehealth: Payer: Self-pay | Admitting: Nurse Practitioner

## 2019-05-07 DIAGNOSIS — E1142 Type 2 diabetes mellitus with diabetic polyneuropathy: Secondary | ICD-10-CM

## 2019-05-07 LAB — HEMOGLOBIN A1C
Hgb A1c MFr Bld: 9.9 % of total Hgb — ABNORMAL HIGH (ref <5.7)
Mean Plasma Glucose: 237 (calc)
eAG (mmol/L): 13.2 (calc)

## 2019-05-07 MED ORDER — SITAGLIPTIN PHOSPHATE 50 MG PO TABS: 50.0000 mg | ORAL_TABLET | Freq: Every day | ORAL | 5 refills | Status: AC

## 2019-05-07 MED ORDER — METFORMIN HCL 500 MG PO TABS: 500.0000 mg | ORAL_TABLET | Freq: Two times a day (BID) | ORAL | 1 refills | Status: AC

## 2019-05-07 MED ORDER — GLIPIZIDE ER 5 MG PO TB24: 5.0000 mg | ORAL_TABLET | Freq: Every day | ORAL | 1 refills | Status: AC

## 2019-05-07 NOTE — Assessment & Plan Note (Signed)
HgbA1c of 9.9: uncontrolled DM. Increase glipizide to 5mg  and start metformin 500mg  BID. New rx sent Advised to check glucose 1-2x/day and record. She is to schedule ophthalmology appt She declined foot exam. Pending urine microalbumin. LDL not at goal, crestor prescribed F/up in 66months in office

## 2019-05-07 NOTE — Telephone Encounter (Signed)
-----   Message from [name], LPN sent at [id number]  1:43 PM EDT ----- Pt denied rx for metformin due to side effects. Please advise

## 2019-05-07 NOTE — Telephone Encounter (Signed)
Changed to [name]

## 2019-05-07 NOTE — Telephone Encounter (Signed)
Left voicemail to inform pt of new script to call us back with questions or concern.

## 2019-05-09 LAB — VITAMIN D 1,25 DIHYDROXY
Vitamin D 1, 25 (OH)2 Total: 45 pg/mL (ref 18–72)
Vitamin D2 1, 25 (OH)2: <8 pg/mL
Vitamin D3 1, 25 (OH)2: 45 pg/mL

## 2019-05-12 NOTE — Telephone Encounter (Signed)
Left another voicemail for patient to call back for lab results and to make sure she picked up her prescription.

## 2019-05-15 NOTE — Telephone Encounter (Signed)
Tried again to call pt, left voicemail to go over lab results and see if she has any questions about Januvia

## 2019-05-18 DIAGNOSIS — S90851A Superficial foreign body, right foot, initial encounter: Secondary | ICD-10-CM | POA: Diagnosis not present

## 2019-05-18 DIAGNOSIS — C569 Malignant neoplasm of unspecified ovary: Secondary | ICD-10-CM | POA: Diagnosis not present

## 2019-05-18 DIAGNOSIS — E11621 Type 2 diabetes mellitus with foot ulcer: Secondary | ICD-10-CM | POA: Diagnosis not present

## 2019-05-18 DIAGNOSIS — A4101 Sepsis due to Methicillin susceptible Staphylococcus aureus: Secondary | ICD-10-CM | POA: Diagnosis not present

## 2019-05-27 ENCOUNTER — Encounter: Payer: Self-pay | Admitting: Nurse Practitioner

## 2019-06-17 DIAGNOSIS — S90851A Superficial foreign body, right foot, initial encounter: Secondary | ICD-10-CM | POA: Diagnosis not present

## 2019-06-17 DIAGNOSIS — A4101 Sepsis due to Methicillin susceptible Staphylococcus aureus: Secondary | ICD-10-CM | POA: Diagnosis not present

## 2019-06-17 DIAGNOSIS — E11621 Type 2 diabetes mellitus with foot ulcer: Secondary | ICD-10-CM | POA: Diagnosis not present

## 2019-06-17 DIAGNOSIS — C569 Malignant neoplasm of unspecified ovary: Secondary | ICD-10-CM | POA: Diagnosis not present

## 2019-06-23 ENCOUNTER — Telehealth: Payer: Self-pay | Admitting: Nurse Practitioner

## 2019-06-23 NOTE — Telephone Encounter (Signed)
[name] FYI these are on your desk for review.

## 2019-06-23 NOTE — Telephone Encounter (Signed)
Patient husband came by to drop off paperwork for [facility] to complete and fax back. Paperwork is in Careers information officer at the front desk. Also patient would like a copy once forms are completed. CB is [phone]

## 2019-06-25 NOTE — Telephone Encounter (Signed)
Forms completed and left up front with pt name, pt was notified and is aware to pick up the forms, she said her husband may come in a few days to get them.

## 2019-07-13 NOTE — Telephone Encounter (Signed)
Pt wanted to know if you have a copy of the form that was filled out

## 2019-07-17 NOTE — Telephone Encounter (Signed)
Left pt a voicemail stating that a copy was left up front and her husband picked it up and that a copy was faxed that we didn't have any other copies an if she had any questions or concerns to let us know.

## 2019-07-18 DIAGNOSIS — E11621 Type 2 diabetes mellitus with foot ulcer: Secondary | ICD-10-CM | POA: Diagnosis not present

## 2019-07-18 DIAGNOSIS — A4101 Sepsis due to Methicillin susceptible Staphylococcus aureus: Secondary | ICD-10-CM | POA: Diagnosis not present

## 2019-07-18 DIAGNOSIS — S90851A Superficial foreign body, right foot, initial encounter: Secondary | ICD-10-CM | POA: Diagnosis not present

## 2019-07-18 DIAGNOSIS — C569 Malignant neoplasm of unspecified ovary: Secondary | ICD-10-CM | POA: Diagnosis not present

## 2019-08-17 DIAGNOSIS — C569 Malignant neoplasm of unspecified ovary: Secondary | ICD-10-CM | POA: Diagnosis not present

## 2019-08-17 DIAGNOSIS — E11621 Type 2 diabetes mellitus with foot ulcer: Secondary | ICD-10-CM | POA: Diagnosis not present

## 2019-08-17 DIAGNOSIS — S90851A Superficial foreign body, right foot, initial encounter: Secondary | ICD-10-CM | POA: Diagnosis not present

## 2019-08-17 DIAGNOSIS — A4101 Sepsis due to Methicillin susceptible Staphylococcus aureus: Secondary | ICD-10-CM | POA: Diagnosis not present

## 2019-08-25 DIAGNOSIS — Z7409 Other reduced mobility: Secondary | ICD-10-CM | POA: Diagnosis not present

## 2019-08-25 DIAGNOSIS — I10 Essential (primary) hypertension: Secondary | ICD-10-CM | POA: Diagnosis not present

## 2019-08-25 DIAGNOSIS — Z8249 Family history of ischemic heart disease and other diseases of the circulatory system: Secondary | ICD-10-CM | POA: Diagnosis not present

## 2019-08-25 DIAGNOSIS — Z9181 History of falling: Secondary | ICD-10-CM | POA: Diagnosis not present

## 2019-09-17 DIAGNOSIS — C569 Malignant neoplasm of unspecified ovary: Secondary | ICD-10-CM | POA: Diagnosis not present

## 2019-09-17 DIAGNOSIS — E11621 Type 2 diabetes mellitus with foot ulcer: Secondary | ICD-10-CM | POA: Diagnosis not present

## 2019-09-17 DIAGNOSIS — A4101 Sepsis due to Methicillin susceptible Staphylococcus aureus: Secondary | ICD-10-CM | POA: Diagnosis not present

## 2019-09-17 DIAGNOSIS — S90851A Superficial foreign body, right foot, initial encounter: Secondary | ICD-10-CM | POA: Diagnosis not present

## 2019-10-13 ENCOUNTER — Telehealth: Payer: Self-pay | Admitting: Nurse Practitioner

## 2019-10-13 NOTE — Progress Notes (Signed)
  Chronic Care Management   Outreach Note  10/13/2019 Name: [name] MRN: [id number] DOB: 1941-01-19  Referred by: Flossie Buffy, NP Reason for referral : No chief complaint on file.   An unsuccessful telephone outreach was attempted today. The patient was referred to the pharmacist for assistance with care management and care coordination.   Follow Up Plan:   Carley Perdue [employer] Scheduler

## 2019-10-18 DIAGNOSIS — E11621 Type 2 diabetes mellitus with foot ulcer: Secondary | ICD-10-CM | POA: Diagnosis not present

## 2019-10-18 DIAGNOSIS — C569 Malignant neoplasm of unspecified ovary: Secondary | ICD-10-CM | POA: Diagnosis not present

## 2019-10-18 DIAGNOSIS — S90851A Superficial foreign body, right foot, initial encounter: Secondary | ICD-10-CM | POA: Diagnosis not present

## 2019-10-18 DIAGNOSIS — A4101 Sepsis due to Methicillin susceptible Staphylococcus aureus: Secondary | ICD-10-CM | POA: Diagnosis not present

## 2019-11-06 ENCOUNTER — Telehealth: Payer: Self-pay | Admitting: Nurse Practitioner

## 2019-11-06 NOTE — Progress Notes (Signed)
  Chronic Care Management   Outreach Note  11/06/2019 Name: Doral Digangi MRN: [id number] DOB: Dec 29, 1940  Referred by: Flossie Buffy, NP Reason for referral : No chief complaint on file.   Third unsuccessful telephone outreach was attempted today. The patient was referred to the pharmacist for assistance with care management and care coordination.   Follow Up Plan:   Carley Perdue [employer] Scheduler

## 2019-11-17 DIAGNOSIS — A4101 Sepsis due to Methicillin susceptible Staphylococcus aureus: Secondary | ICD-10-CM | POA: Diagnosis not present

## 2019-11-17 DIAGNOSIS — C569 Malignant neoplasm of unspecified ovary: Secondary | ICD-10-CM | POA: Diagnosis not present

## 2019-11-17 DIAGNOSIS — S90851A Superficial foreign body, right foot, initial encounter: Secondary | ICD-10-CM | POA: Diagnosis not present

## 2019-11-17 DIAGNOSIS — E11621 Type 2 diabetes mellitus with foot ulcer: Secondary | ICD-10-CM | POA: Diagnosis not present

## 2019-12-18 DIAGNOSIS — E11621 Type 2 diabetes mellitus with foot ulcer: Secondary | ICD-10-CM | POA: Diagnosis not present

## 2019-12-18 DIAGNOSIS — S90851A Superficial foreign body, right foot, initial encounter: Secondary | ICD-10-CM | POA: Diagnosis not present

## 2019-12-18 DIAGNOSIS — C569 Malignant neoplasm of unspecified ovary: Secondary | ICD-10-CM | POA: Diagnosis not present

## 2019-12-18 DIAGNOSIS — A4101 Sepsis due to Methicillin susceptible Staphylococcus aureus: Secondary | ICD-10-CM | POA: Diagnosis not present

## 2020-01-17 DIAGNOSIS — E11621 Type 2 diabetes mellitus with foot ulcer: Secondary | ICD-10-CM | POA: Diagnosis not present

## 2020-01-17 DIAGNOSIS — C569 Malignant neoplasm of unspecified ovary: Secondary | ICD-10-CM | POA: Diagnosis not present

## 2020-01-17 DIAGNOSIS — S90851A Superficial foreign body, right foot, initial encounter: Secondary | ICD-10-CM | POA: Diagnosis not present

## 2020-01-17 DIAGNOSIS — A4101 Sepsis due to Methicillin susceptible Staphylococcus aureus: Secondary | ICD-10-CM | POA: Diagnosis not present

## 2020-02-12 NOTE — Telephone Encounter (Signed)
error 

## 2020-02-17 DIAGNOSIS — A4101 Sepsis due to Methicillin susceptible Staphylococcus aureus: Secondary | ICD-10-CM | POA: Diagnosis not present

## 2020-02-17 DIAGNOSIS — S90851A Superficial foreign body, right foot, initial encounter: Secondary | ICD-10-CM | POA: Diagnosis not present

## 2020-02-17 DIAGNOSIS — E11621 Type 2 diabetes mellitus with foot ulcer: Secondary | ICD-10-CM | POA: Diagnosis not present

## 2020-02-17 DIAGNOSIS — C569 Malignant neoplasm of unspecified ovary: Secondary | ICD-10-CM | POA: Diagnosis not present

## 2020-02-26 ENCOUNTER — Telehealth: Payer: Self-pay

## 2020-02-27 NOTE — Telephone Encounter (Signed)
Do they have a case ID? I can not locate file in [place]

## 2020-03-01 DIAGNOSIS — 419620001 Death: Secondary | SNOMED CT | POA: Diagnosis not present

## 2020-03-01 NOTE — Telephone Encounter (Signed)
I called and spoke to [name] to ask questions below.  [name] informed me that the funeral home should be able to give information needed.  The funeral home the pt is in may be [place], [phone].  Please see message.  Thank you.

## 2020-03-01 NOTE — Telephone Encounter (Signed)
Tori form [facility] calling for provider to sign off on pt's death certificate.  Please advise.  CB#[phone]

## 2020-03-01 NOTE — Telephone Encounter (Cosign Needed)
I spoke with [name] and she informed me that the funeral home will contact office in regards to the information need below in [name]'s message.   [employer], is where the pt is.  Please see message.  Thank you.

## 2020-03-01 DEATH — deceased

## 2020-03-02 NOTE — Telephone Encounter (Signed)
Death certificate signed under [facility]

## 2020-03-09 IMAGING — DX RIGHT FOOT COMPLETE - 3+ VIEW
3 series · 3 of 3 positions shown · non-contrast
Comparison: 04/17/2018

CLINICAL DATA: Foot pain and swelling

EXAM:
RIGHT FOOT COMPLETE - 3+ VIEW

[foot ap]
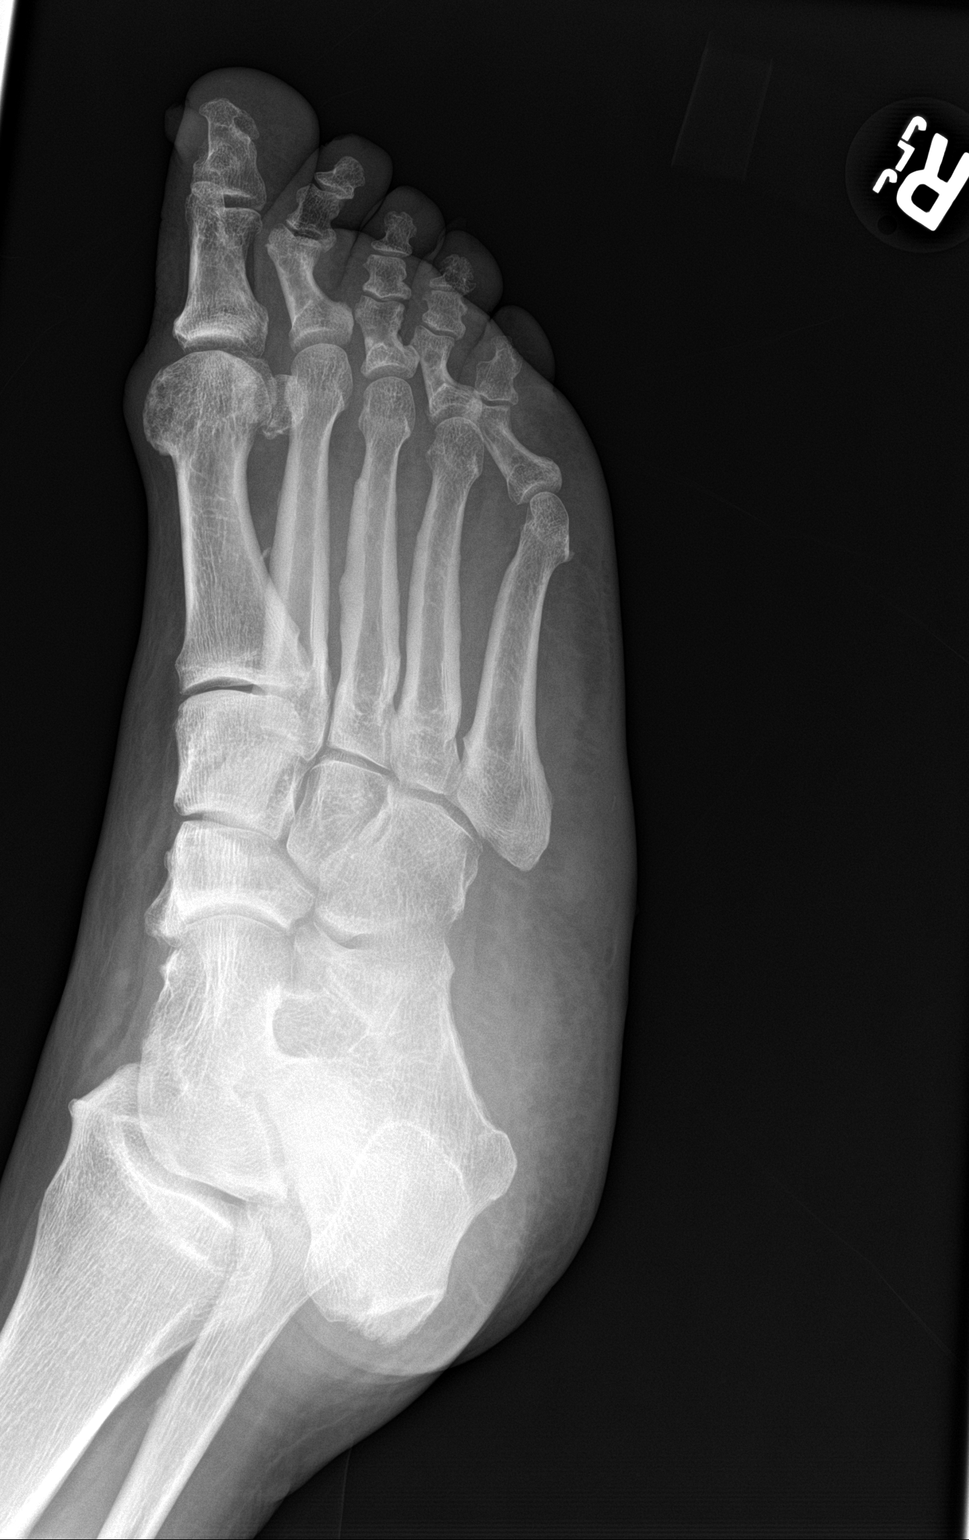

[foot obl]
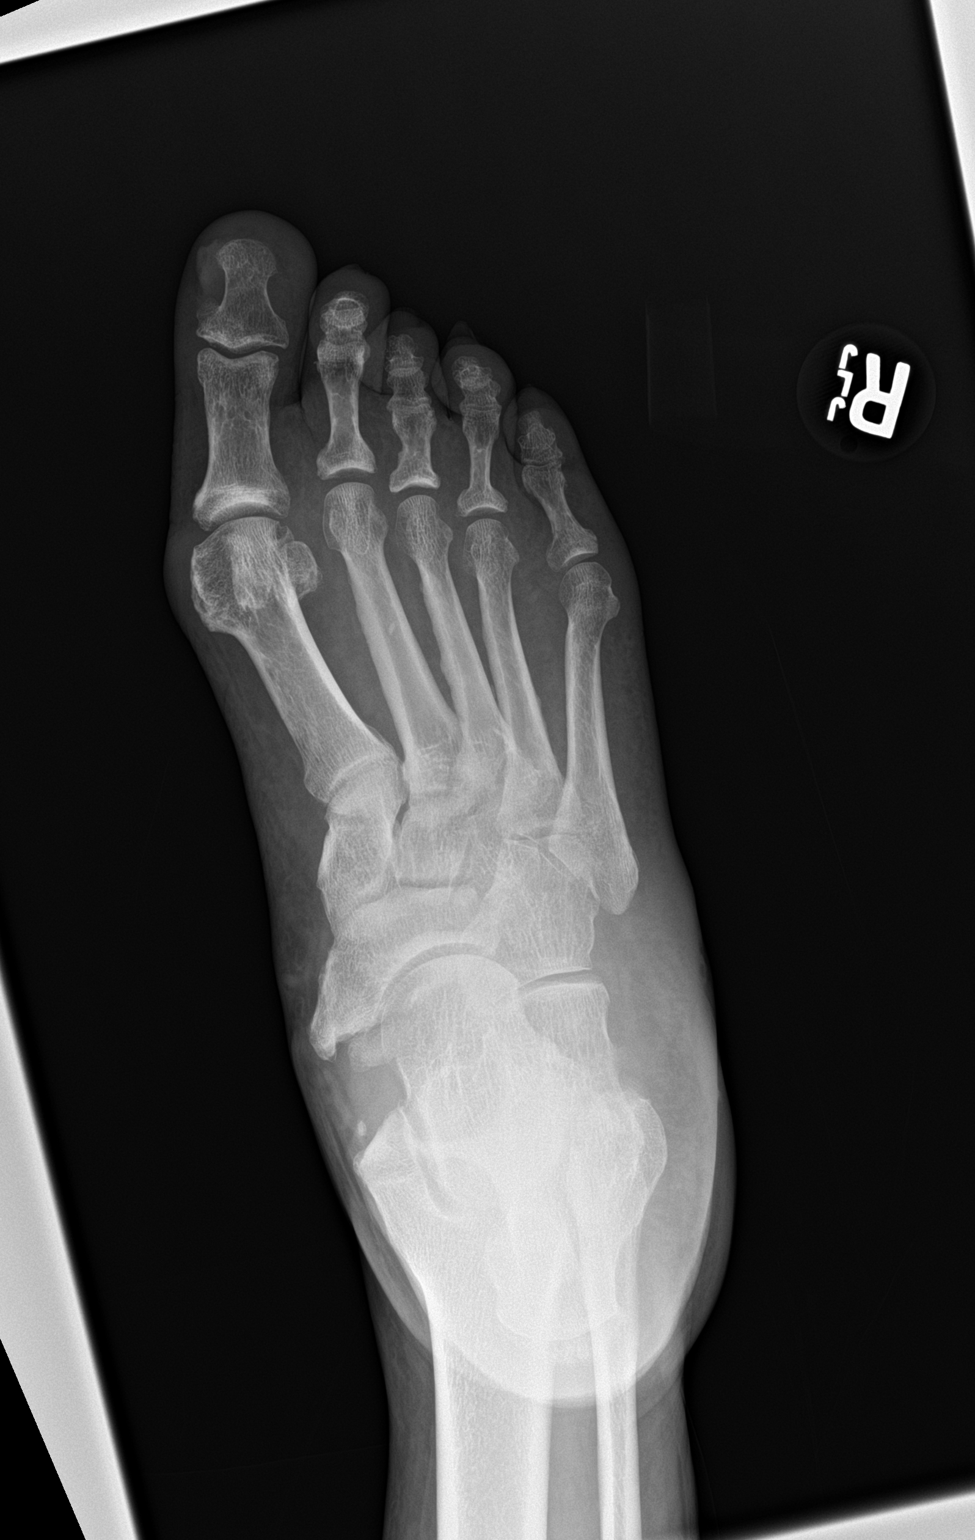

[foot lat]
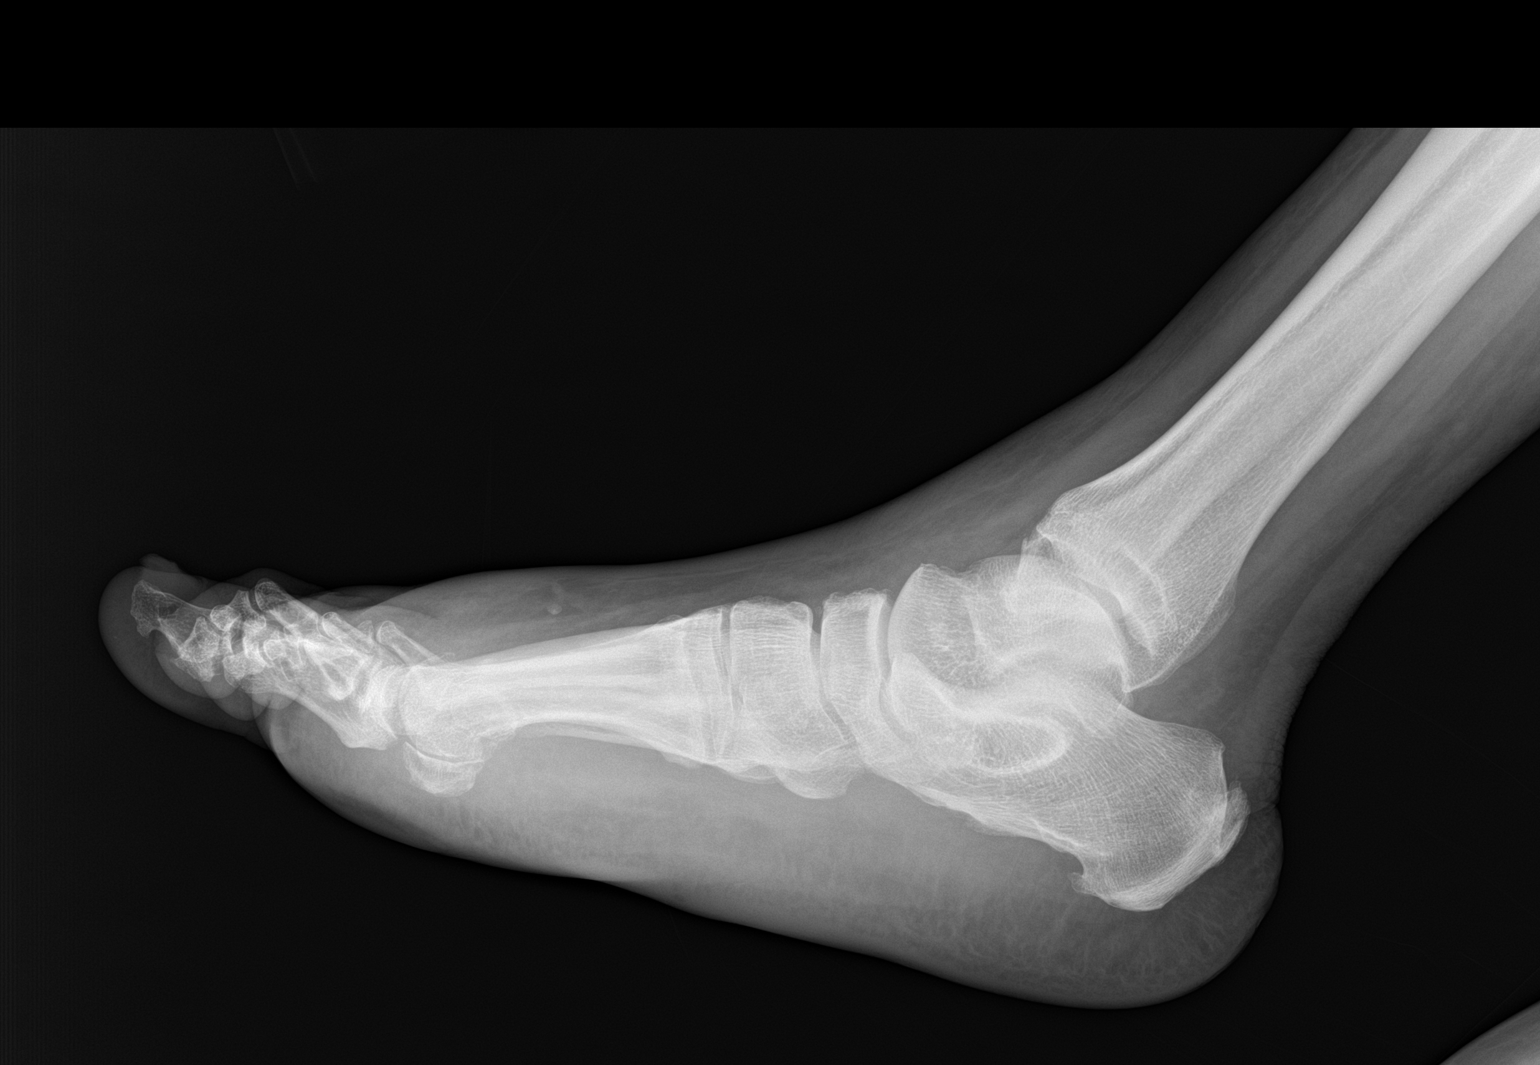

[3 of 3 positions shown; findings below may reference images not displayed]

FINDINGS: Previously seen linear foreign body in the plantar soft tissues of
the foot is again identified with appears slightly deeper. Adjacent
soft tissue swelling is noted consistent with the given clinical
history. No acute fracture is seen. Tarsal degenerative changes.
IMPRESSION: No acute fractures noted.

Previously seen foreign body is again identified slightly deeper in
the plantar soft tissues.
